# Patient Record
Sex: Female | Born: 1960 | Race: White | Hispanic: No | Marital: Married | Smoking: Never smoker
Health system: Southern US, Community
[De-identification: ages and names within clinical notes are randomized; demographics above are authoritative.]

## PROBLEM LIST (undated history)

## (undated) DIAGNOSIS — C50511 Malignant neoplasm of lower-outer quadrant of right female breast: Secondary | ICD-10-CM

## (undated) DIAGNOSIS — Z9221 Personal history of antineoplastic chemotherapy: Secondary | ICD-10-CM

## (undated) DIAGNOSIS — G43909 Migraine, unspecified, not intractable, without status migrainosus: Secondary | ICD-10-CM

## (undated) DIAGNOSIS — E860 Dehydration: Secondary | ICD-10-CM

## (undated) DIAGNOSIS — R55 Syncope and collapse: Secondary | ICD-10-CM

## (undated) DIAGNOSIS — Z923 Personal history of irradiation: Secondary | ICD-10-CM

## (undated) HISTORY — DX: Malignant neoplasm of lower-outer quadrant of right female breast: C50.511

## (undated) HISTORY — DX: Migraine, unspecified, not intractable, without status migrainosus: G43.909

## (undated) HISTORY — DX: Dehydration: E86.0

## (undated) HISTORY — DX: Syncope and collapse: R55

---

## 2006-04-30 ENCOUNTER — Ambulatory Visit: Payer: Self-pay | Admitting: Unknown Physician Specialty

## 2007-05-11 ENCOUNTER — Ambulatory Visit: Payer: Self-pay | Admitting: Unknown Physician Specialty

## 2008-05-16 ENCOUNTER — Ambulatory Visit: Payer: Self-pay | Admitting: Unknown Physician Specialty

## 2009-05-18 ENCOUNTER — Ambulatory Visit: Payer: Self-pay | Admitting: Unknown Physician Specialty

## 2010-06-07 ENCOUNTER — Ambulatory Visit: Payer: Self-pay | Admitting: Unknown Physician Specialty

## 2011-07-28 ENCOUNTER — Ambulatory Visit: Payer: Self-pay | Admitting: Unknown Physician Specialty

## 2011-12-02 HISTORY — PX: COLONOSCOPY: SHX174

## 2012-01-08 ENCOUNTER — Ambulatory Visit: Payer: Self-pay | Admitting: Unknown Physician Specialty

## 2012-07-29 ENCOUNTER — Ambulatory Visit: Payer: Self-pay | Admitting: Family Medicine

## 2013-08-02 ENCOUNTER — Ambulatory Visit: Payer: Self-pay | Admitting: Family Medicine

## 2014-08-03 ENCOUNTER — Ambulatory Visit: Payer: Self-pay | Admitting: Family Medicine

## 2014-12-01 HISTORY — PX: BREAST LUMPECTOMY: SHX2

## 2015-01-30 DIAGNOSIS — C50919 Malignant neoplasm of unspecified site of unspecified female breast: Secondary | ICD-10-CM | POA: Insufficient documentation

## 2015-02-07 ENCOUNTER — Ambulatory Visit: Payer: Self-pay | Admitting: Nurse Practitioner

## 2015-02-09 ENCOUNTER — Ambulatory Visit: Payer: Self-pay | Admitting: Nurse Practitioner

## 2015-02-09 DIAGNOSIS — C50511 Malignant neoplasm of lower-outer quadrant of right female breast: Secondary | ICD-10-CM

## 2015-02-09 HISTORY — DX: Malignant neoplasm of lower-outer quadrant of right female breast: C50.511

## 2015-02-14 ENCOUNTER — Other Ambulatory Visit: Payer: BLUE CROSS/BLUE SHIELD

## 2015-02-14 ENCOUNTER — Ambulatory Visit (INDEPENDENT_AMBULATORY_CARE_PROVIDER_SITE_OTHER): Payer: BLUE CROSS/BLUE SHIELD | Admitting: General Surgery

## 2015-02-14 ENCOUNTER — Encounter: Payer: Self-pay | Admitting: General Surgery

## 2015-02-14 VITALS — BP 130/76 | HR 76 | Resp 14 | Ht 64.0 in | Wt 152.0 lb

## 2015-02-14 DIAGNOSIS — C50911 Malignant neoplasm of unspecified site of right female breast: Secondary | ICD-10-CM

## 2015-02-14 DIAGNOSIS — N63 Unspecified lump in breast: Secondary | ICD-10-CM

## 2015-02-14 DIAGNOSIS — N6310 Unspecified lump in the right breast, unspecified quadrant: Secondary | ICD-10-CM

## 2015-02-14 DIAGNOSIS — C50919 Malignant neoplasm of unspecified site of unspecified female breast: Secondary | ICD-10-CM

## 2015-02-14 NOTE — Progress Notes (Signed)
Patient ID: Wanda Lopez, female   DOB: 08/18/1961, 54 y.o.   MRN: [id number]  Chief Complaint  Patient presents with  . Other    breast cancer    HPI Wanda Lopez is a 54 y.o. female who presents for a breast evaluation. The most recent mammogram was done on 02-07-15.     She had a breast biopsy done on 02-09-15. Patient performs intermittent breast self breast checks, but does obtain regular mammogram.  She states she noticed the lump in the right breast about 3 weeks ago, near the end of February. Examination at that time showed a new mass in the breast. Denies any family history of breast cancer. She is here today with her husband as well as her sister, Rosine Door.  HPI  No past medical history on file.  Past Surgical History  Procedure Laterality Date  . Colonoscopy  2013    Dr Vira Agar  . Breast biopsy Right 02-09-15    Family History  Problem Relation Age of Onset  . Hypertension Mother   . Hypertension Father   . Diabetes Father   . Heart failure Mother     Social History History  Substance Use Topics  . Smoking status: Never Smoker   . Smokeless tobacco: Never Used  . Alcohol Use: No    No Known Allergies  Current Outpatient Prescriptions  Medication Sig Dispense Refill  . acetaminophen (TYLENOL) 325 MG tablet Take 650 mg by mouth every 6 (six) hours as needed.    . norethindrone-ethinyl estradiol (MICROGESTIN,JUNEL,LOESTRIN) 1-20 MG-MCG tablet Take 1 tablet by mouth daily.     No current facility-administered medications for this visit.    Review of Systems Review of Systems  Constitutional: Negative.   Respiratory: Negative.   Cardiovascular: Negative.     Blood pressure 130/76, pulse 76, resp. rate 14, height 5' 4"  (1.626 m), weight 152 lb (68.947 kg), last menstrual period 01/31/2015.  Physical Exam Physical Exam  Constitutional: She is oriented to person, place, and time. She appears well-developed and well-nourished.  Eyes: Conjunctivae  are normal. No scleral icterus.  Neck: Neck supple.  Cardiovascular: Normal rate, regular rhythm and normal heart sounds.   Pulmonary/Chest: Effort normal and breath sounds normal. Right breast exhibits no inverted nipple, no nipple discharge, no skin change and no tenderness. Left breast exhibits no inverted nipple, no mass, no nipple discharge, no skin change and no tenderness. Breasts are asymmetrical (right breast 1/2 cups size bigger than left).    Abdominal: Soft. Bowel sounds are normal. There is no tenderness.  Lymphadenopathy:    She has no cervical adenopathy.    She has no axillary adenopathy.  Neurological: She is alert and oriented to person, place, and time.  Skin: Skin is warm and dry.    Data Reviewed Screening mammograms dated 08/15/2014 completed [facility] were negative.  Right breast mammogram dated 02/07/2015 showed a 1.4 cm lobular mass need o'clock position of the right breast. Ultrasound showed a 1.0 x 1.3 x 2.0 cm mass.  No axillary adenopathy was noted.  Pathology dated 02/09/2015 showed invasive mammary carcinoma no specific type. Maximum dimension 0.6 cm. ER negative, PR negative, HER-2/neu not amplified.    Ultrasound examination of the right breast was completed to determine possibility for breast conservation and axillary assessment. Examination of the right breast mass at the 9:00 position showed a 1.38 x 1.43 x 1.98 cm hypoechoic, irregular mass consistent with a breast cancer. Exhilarated exam showed a 0.3 x 0.47  x 0.47 cm normal-appearing lymph node.  Assessment    Stage I carcinoma the right breast.    Plan    Telephone conversation with medical oncology: She is not a candidate for neoadjuvant chemotherapy.  We spent the better part of an hour reviewing options for management. She had been told by the etiologies that she would have a lumpectomy.   Options for management of stage I breast cancer were reviewed. Pros and cons of mastectomy versus  partial mastectomy/lumpectomy with radiation therapy were reviewed. The indication for likely adjuvant chemotherapy considering the triple negative status was discussed. This unfortunately became the focal point of the discussion as the patient was brought to tears with the idea that she might lose her hair. She expressed that she had been told that she would simply have a lumpectomy and move on.  We cleared up the misconceptions regarding management of early breast cancer, we were able to get back on track with review of options for management. Options for plastic surgery consultation were discussed. Potential second opinion or meeting with medical oncology/radiation oncology were reviewed.  The patient was provided with written information regarding options for management. She was encouraged to call should her discussions with family generate any questions (approximately 12 questions were answered that had been recorded by the sister prior to the visit).  She will have a CA 27-29 and CEA completed today.  We'll plan for phone follow-up by March 21 at the latest and proceed from that point.      PCP:  Derl Barrow NP  Robert Bellow 02/14/2015, 7:47 PM

## 2015-02-15 ENCOUNTER — Telehealth: Payer: Self-pay | Admitting: General Surgery

## 2015-02-15 LAB — CANCER ANTIGEN 27.29: CA 27.29: 22.6 U/mL (ref 0.0–38.6)

## 2015-02-15 LAB — CEA: CEA: 1.3 ng/mL (ref 0.0–4.7)

## 2015-02-15 NOTE — Telephone Encounter (Signed)
The patient was notified that her CEA and CA 7 27-29 were both normal.  We reviewed options for management including mastectomy versus lumpectomy with postoperative radiation therapy. Sentinel node biopsy was again reviewed.  The big problem is really the likelihood that chemotherapy is going to be recommended. We reviewed the fact that her receptor status is negative, and these tumors tend to be a little more fast-growing and fortunately tend to be a little more responsive to chemotherapy.  She is thinking strongly about breast conservation, and may be making her decision between now and Monday the 21st. We may be able to get her on the schedule for Wednesday the 23rd. She'll call the office with her final decision, any additional questions or if she decides to proceed with second opinion.

## 2015-02-16 ENCOUNTER — Telehealth: Payer: Self-pay

## 2015-02-16 ENCOUNTER — Other Ambulatory Visit: Payer: Self-pay | Admitting: General Surgery

## 2015-02-16 DIAGNOSIS — C50911 Malignant neoplasm of unspecified site of right female breast: Secondary | ICD-10-CM

## 2015-02-16 NOTE — Telephone Encounter (Signed)
Patient called to go over surgery instructions and get dates and times. Patient is scheduled for surgery at [facility] on 02/21/15. She will pre admit by phone on 02/20/15. She will arrive at [facility] on 02/21/15 at 11:15 am. Surgery instructions reviewed with the patient. Patient is aware of dates, time, and instructions.

## 2015-02-19 ENCOUNTER — Encounter: Payer: Self-pay | Admitting: General Surgery

## 2015-02-20 ENCOUNTER — Ambulatory Visit: Payer: Self-pay | Admitting: Anesthesiology

## 2015-02-20 ENCOUNTER — Encounter: Payer: Self-pay | Admitting: General Surgery

## 2015-02-21 ENCOUNTER — Ambulatory Visit: Payer: Self-pay | Admitting: General Surgery

## 2015-02-21 ENCOUNTER — Encounter: Payer: Self-pay | Admitting: General Surgery

## 2015-02-21 DIAGNOSIS — C50511 Malignant neoplasm of lower-outer quadrant of right female breast: Secondary | ICD-10-CM | POA: Diagnosis not present

## 2015-02-21 HISTORY — PX: BREAST EXCISIONAL BIOPSY: SUR124

## 2015-02-22 ENCOUNTER — Encounter: Payer: Self-pay | Admitting: General Surgery

## 2015-02-23 ENCOUNTER — Telehealth: Payer: Self-pay | Admitting: General Surgery

## 2015-02-23 NOTE — Telephone Encounter (Signed)
Patient notified margins clear and single SLN negative for metastatic disease. Reports minimal pain. Unwrapping ace tomorrow, follow up in office in three days.

## 2015-02-26 ENCOUNTER — Other Ambulatory Visit: Payer: BLUE CROSS/BLUE SHIELD

## 2015-02-26 ENCOUNTER — Encounter: Payer: Self-pay | Admitting: General Surgery

## 2015-02-26 ENCOUNTER — Ambulatory Visit (INDEPENDENT_AMBULATORY_CARE_PROVIDER_SITE_OTHER): Payer: BLUE CROSS/BLUE SHIELD | Admitting: General Surgery

## 2015-02-26 VITALS — BP 122/74 | HR 74 | Resp 12 | Ht 64.0 in | Wt 151.0 lb

## 2015-02-26 DIAGNOSIS — C50911 Malignant neoplasm of unspecified site of right female breast: Secondary | ICD-10-CM

## 2015-02-26 NOTE — Telephone Encounter (Signed)
Patient has been scheduled for an appointment with Dr. Grayland Ormond at the [facility] for 02-27-15 at 10 am (arrive 9:45 am). She is aware of date, time, and instructions.   This patient will follow up in the office this afternoon with Dr. Bary Castilla as scheduled.

## 2015-02-26 NOTE — Patient Instructions (Signed)
Patient to return in ine week.

## 2015-02-26 NOTE — Progress Notes (Addendum)
Patient ID: Wanda Lopez, female   DOB: 14-Jun-1961, 54 y.o.   MRN: [id number]  Chief Complaint  Patient presents with  . Routine Post Op    right breast and sentinel node biopsy    HPI Wanda Lopez is a 54 y.o. female  Here for a post op right breast biopsy and sentinel node biopsy done on 02/21/15. Patient states she is doing well.  HPI  Past Medical History  Diagnosis Date  . 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 Wide excision, sentinel node biopsy.    Past Surgical History  Procedure Laterality Date  . Colonoscopy  2013    Dr Vira Agar  . Breast biopsy Right 02-09-15    wide excision/sentinel node biopsy.    Family History  Problem Relation Age of Onset  . Hypertension Mother   . Hypertension Father   . Diabetes Father   . Heart failure Mother     Social History History  Substance Use Topics  . Smoking status: Never Smoker   . Smokeless tobacco: Never Used  . Alcohol Use: No    No Known Allergies  Current Outpatient Prescriptions  Medication Sig Dispense Refill  . acetaminophen (TYLENOL) 325 MG tablet Take 650 mg by mouth every 6 (six) hours as needed.    . norethindrone-ethinyl estradiol (MICROGESTIN,JUNEL,LOESTRIN) 1-20 MG-MCG tablet Take 1 tablet by mouth daily.     No current facility-administered medications for this visit.    Review of Systems Review of Systems  Constitutional: Negative.   Respiratory: Negative.   Cardiovascular: Negative.     Blood pressure 122/74, pulse 74, resp. rate 12, height 5' 4"  (1.626 m), weight 151 lb (68.493 kg), last menstrual period 01/31/2015.  Physical Exam Physical Exam  Constitutional: She is oriented to person, place, and time. She appears well-developed and well-nourished.  Eyes: Conjunctivae are normal. No scleral icterus.  Neck: Neck supple.  Cardiovascular: Normal rate, regular rhythm and normal heart sounds.    Pulmonary/Chest: Effort normal and breath sounds normal.  Right breast incision looks clean and healing well.   Lymphadenopathy:    She has no cervical adenopathy.  Neurological: She is alert and oriented to person, place, and time.  Skin: Skin is warm and dry.    Data Reviewed Pathology showed a 1.7 cm medullary carcinoma. These tumors tend to have a more favorable course in spite of there pathologic appearance. Up to 10% of patients with medullary carcinoma can have BRCA mutation, and the possibility/recommendation for testing was reviewed with the patient and her sister today. As she is scheduled to meet with medical oncology tomorrow, we'll defer testing until their evaluation is complete.  Ultrasound examination of the wide excision site shows a small seroma measuring 0.6 x 4 cm along the incision. There is a deep cavity post mastoplasty measuring 1.86 x 2.4 cm. This is located 1.66 cm below the skin. She is a potential candidate for partial breast radiation.  Assessment    Medullary carcinoma the right breast.     Plan    Indications for medical oncology as well as radiation oncology input was reviewed. The possibility of partial breast radiation was briefly touched upon, but this will be determined by Dr. Baruch Gouty.  Arrangements have been made for the patient to meet with Dr. Grayland Ormond and Dr. Baruch Gouty tomorrow.  Patient to return in one week.     PCP:  Theda Sers  02/26/2015, 9:02 PM

## 2015-02-27 ENCOUNTER — Ambulatory Visit
Admit: 2015-02-27 | Disposition: A | Discharge status: Home / Self Care | Payer: Self-pay | Attending: Oncology | Admitting: Oncology

## 2015-02-28 ENCOUNTER — Telehealth: Payer: Self-pay | Admitting: *Deleted

## 2015-02-28 MED ORDER — CEFADROXIL 500 MG PO CAPS: 500.0000 mg | ORAL_CAPSULE | Freq: Two times a day (BID) | ORAL | Status: DC

## 2015-02-28 NOTE — Telephone Encounter (Signed)
Mammosite schedule reviewed with the patient Placement  03-14-15 1:00       at [facility] Scan 03-16-15 Treat 4-18 to [place] will be calling her for more details Aware of ATB and directions reviewed. Aware no showers and to wear her bra while mammosite in place. Pt agrees.

## 2015-03-01 ENCOUNTER — Encounter: Payer: Self-pay | Admitting: General Surgery

## 2015-03-01 ENCOUNTER — Telehealth: Payer: Self-pay | Admitting: *Deleted

## 2015-03-01 NOTE — Telephone Encounter (Signed)
Pt wants to know if she feels like it, can she go back to work on Monday the 4th?

## 2015-03-02 ENCOUNTER — Telehealth: Payer: Self-pay | Admitting: *Deleted

## 2015-03-02 ENCOUNTER — Ambulatory Visit
Admit: 2015-03-02 | Disposition: A | Discharge status: Home / Self Care | Payer: Self-pay | Attending: Oncology | Admitting: Oncology

## 2015-03-02 NOTE — Telephone Encounter (Signed)
Pt is aware that she can go back to work on Monday April 4th if she is comfortable

## 2015-03-02 NOTE — Telephone Encounter (Signed)
OK to return to work when comfortable.

## 2015-03-06 ENCOUNTER — Encounter: Payer: Self-pay | Admitting: General Surgery

## 2015-03-06 ENCOUNTER — Ambulatory Visit (INDEPENDENT_AMBULATORY_CARE_PROVIDER_SITE_OTHER): Payer: BLUE CROSS/BLUE SHIELD | Admitting: General Surgery

## 2015-03-06 VITALS — BP 128/70 | HR 74 | Resp 12 | Ht 64.0 in | Wt 150.0 lb

## 2015-03-06 DIAGNOSIS — C50911 Malignant neoplasm of unspecified site of right female breast: Secondary | ICD-10-CM

## 2015-03-06 NOTE — Progress Notes (Signed)
Patient ID: Wanda Lopez, female   DOB: Jun 01, 1961, 54 y.o.   MRN: [id number]  Chief Complaint  Patient presents with  . Follow-up    Breast cancer    HPI Wanda Lopez is a 54 y.o. female here today for a evaluation of a port placement scheduled for 03/29/15 and breast cancer follow up. Mammoite is scheduled 02/11/15.  HPI  Past Medical History  Diagnosis Date  . 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 Wide excision, sentinel node biopsy.    Past Surgical History  Procedure Laterality Date  . Colonoscopy  2013    Dr Vira Agar  . Breast biopsy Right 02-09-15    wide excision/sentinel node biopsy.    Family History  Problem Relation Age of Onset  . Hypertension Mother   . Hypertension Father   . Diabetes Father   . Heart failure Mother     Social History History  Substance Use Topics  . Smoking status: Never Smoker   . Smokeless tobacco: Never Used  . Alcohol Use: No    No Known Allergies  Current Outpatient Prescriptions  Medication Sig Dispense Refill  . acetaminophen (TYLENOL) 325 MG tablet Take 650 mg by mouth every 6 (six) hours as needed.    . cefadroxil (DURICEF) 500 MG capsule Take 1 capsule (500 mg total) by mouth 2 (two) times daily. Start one hour before office procedure on 03-14-15 20 capsule 0  . norethindrone-ethinyl estradiol (MICROGESTIN,JUNEL,LOESTRIN) 1-20 MG-MCG tablet Take 1 tablet by mouth daily.     No current facility-administered medications for this visit.    Review of Systems Review of Systems  Constitutional: Negative.   Respiratory: Negative.   Cardiovascular: Negative.     Blood pressure 128/70, pulse 74, resp. rate 12, height 5\' 4"  (1.626 m), weight 150 lb (68.04 kg), last menstrual period 01/31/2015.  Physical Exam Physical Exam  Constitutional: She is oriented to person, place, and time. She appears well-developed and well-nourished.  Eyes:  Conjunctivae are normal. No scleral icterus.  Neck: Neck supple.  Cardiovascular: Normal rate, regular rhythm and normal heart sounds.   Pulmonary/Chest: Effort normal and breath sounds normal. Right breast exhibits no inverted nipple, no mass, no nipple discharge, no skin change and no tenderness.  Right breast incision looks clean and healing well.   Lymphadenopathy:    She has no cervical adenopathy.  Neurological: She is alert and oriented to person, place, and time.  Skin: Skin is warm and dry.    Data Reviewed Medical oncology notes.  Assessment    Doing well status post wide excision and sentinel node biopsy. Candidate for partial breast radiation.    Plan    The MammoSite treatment plan was reviewed. The possibility that anatomic spacing may preclude safe radiation necessitating whole breast treatment post chemotherapy..  Mammoite is scheduled 02/11/15 and  port placement scheduled for 03/29/15.      PCP:  Theda Sers 03/07/2015, 5:41 PM

## 2015-03-07 ENCOUNTER — Encounter: Payer: Self-pay | Admitting: General Surgery

## 2015-03-07 DIAGNOSIS — Z171 Estrogen receptor negative status [ER-]: Secondary | ICD-10-CM

## 2015-03-07 DIAGNOSIS — C50511 Malignant neoplasm of lower-outer quadrant of right female breast: Secondary | ICD-10-CM | POA: Insufficient documentation

## 2015-03-14 ENCOUNTER — Encounter: Payer: Self-pay | Admitting: General Surgery

## 2015-03-14 ENCOUNTER — Ambulatory Visit (INDEPENDENT_AMBULATORY_CARE_PROVIDER_SITE_OTHER): Payer: BLUE CROSS/BLUE SHIELD | Admitting: General Surgery

## 2015-03-14 VITALS — BP 130/70 | HR 74 | Resp 12 | Ht 64.0 in | Wt 151.0 lb

## 2015-03-14 DIAGNOSIS — C50911 Malignant neoplasm of unspecified site of right female breast: Secondary | ICD-10-CM | POA: Diagnosis not present

## 2015-03-14 NOTE — Progress Notes (Addendum)
Patient ID: Wanda Lopez, female   DOB: 05-May-1961, 54 y.o.   MRN: [id number]  Chief Complaint  Patient presents with  . Procedure    right mammosite    HPI Wanda Lopez is a 54 y.o. female.  Here today for planned right breast mammosite. Options for management of postoperative radiation therapy had been reviewed with the patient by Noreene Filbert, M.D. We reviewed the plans for placement of a cavity evaluation device followed by treatment balloon if appropriate spacing was evident. HPI  Past Medical History  Diagnosis Date  . 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 Wide excision, sentinel node biopsy.    Past Surgical History  Procedure Laterality Date  . Colonoscopy  2013    Dr Vira Agar  . Breast biopsy Right 02-09-15    wide excision/sentinel node biopsy.    Family History  Problem Relation Age of Onset  . Hypertension Mother   . Hypertension Father   . Diabetes Father   . Heart failure Mother     Social History History  Substance Use Topics  . Smoking status: Never Smoker   . Smokeless tobacco: Never Used  . Alcohol Use: No    No Known Allergies  Current Outpatient Prescriptions  Medication Sig Dispense Refill  . acetaminophen (TYLENOL) 325 MG tablet Take 650 mg by mouth every 6 (six) hours as needed.    . cefadroxil (DURICEF) 500 MG capsule Take 1 capsule (500 mg total) by mouth 2 (two) times daily. Start one hour before office procedure on 03-14-15 20 capsule 0  . norethindrone-ethinyl estradiol (MICROGESTIN,JUNEL,LOESTRIN) 1-20 MG-MCG tablet Take 1 tablet by mouth daily.     No current facility-administered medications for this visit.    Review of Systems Review of Systems  Constitutional: Negative.   Respiratory: Negative.   Cardiovascular: Negative.     Blood pressure 130/70, pulse 74, resp. rate 12, height 5\' 4"  (1.626 m), weight 151 lb (68.493 kg), last menstrual period  01/31/2015.  Physical Exam Physical Exam  Pulmonary/Chest:       Assessment    T1c, triple negative cancer of the right breast. Candidate for partial breast irradiation.    Plan    The area was prepped with alcohol and a total of 10 mL of 0.5% Xylocaine with 0.25% Marcaine with 1-200,000 of epinephrine was utilized well tolerated. ChloraPrep was applied to the skin. An 8 mm trocar was inserted into the deeper seroma cavity during which time the more superficial seroma cavity was also drained. The cavity evaluation device was placed and inflated with 40 mL of saline. Skin spacing was 1.18 cm. The treatment balloon was then placed and inflated to 40 mL with a minimum distance to the skin of 1.12 cm. The balloon was found to be severe: On ultrasound exam. Bacitracin ointment was applied to the exit site followed by dry gauze dressing. The patient was instructed on post placement wound care. (No showers, continue antibiotics).  The procedure was well tolerated.  She will have a pretreatment balloon location confirmation by CT on Friday, April 15 at the radiation center.  She is scheduled for PowerPort placement on 03/29/2015 for adjuvant chemotherapy.  The patient's MUGA scan dated 03/06/2015 showed a normal left ventricular ejection fraction of 67%.     PCP:  Theda Sers 03/17/2015, 10:43 AM

## 2015-03-14 NOTE — Patient Instructions (Signed)
Patient care kit given to patient.  Instructed no showers, sponge bath while mammosite in place, take antibiotic. Follow up with Cancer Center as arranged. Discussed wearing your bra for support at all times. 

## 2015-03-17 ENCOUNTER — Other Ambulatory Visit: Payer: Self-pay | Admitting: General Surgery

## 2015-03-17 DIAGNOSIS — C50911 Malignant neoplasm of unspecified site of right female breast: Secondary | ICD-10-CM

## 2015-03-19 ENCOUNTER — Other Ambulatory Visit: Payer: Self-pay | Admitting: General Surgery

## 2015-03-19 DIAGNOSIS — C50911 Malignant neoplasm of unspecified site of right female breast: Secondary | ICD-10-CM

## 2015-03-23 ENCOUNTER — Other Ambulatory Visit: Payer: Self-pay | Admitting: General Surgery

## 2015-03-23 ENCOUNTER — Other Ambulatory Visit: Payer: BLUE CROSS/BLUE SHIELD

## 2015-03-23 DIAGNOSIS — C50911 Malignant neoplasm of unspecified site of right female breast: Secondary | ICD-10-CM

## 2015-03-26 ENCOUNTER — Encounter: Payer: Self-pay | Admitting: General Surgery

## 2015-03-26 ENCOUNTER — Ambulatory Visit (INDEPENDENT_AMBULATORY_CARE_PROVIDER_SITE_OTHER): Payer: BLUE CROSS/BLUE SHIELD | Admitting: General Surgery

## 2015-03-26 ENCOUNTER — Other Ambulatory Visit: Payer: Self-pay | Admitting: Oncology

## 2015-03-26 VITALS — BP 120/74 | HR 76 | Resp 12 | Ht 64.0 in | Wt 152.0 lb

## 2015-03-26 DIAGNOSIS — C50911 Malignant neoplasm of unspecified site of right female breast: Secondary | ICD-10-CM

## 2015-03-26 LAB — SURGICAL PATHOLOGY

## 2015-03-26 NOTE — Patient Instructions (Addendum)
Patient is scheduled for a port placement on 03/29/15.  Patient to return in six weeks.

## 2015-03-26 NOTE — Progress Notes (Signed)
Patient ID: Wanda Lopez, female   DOB: 01-04-1961, 54 y.o.   MRN: [id number]  Chief Complaint  Patient presents with  . Other    right mammosite    HPI Wanda Lopez is a 54 y.o. female here today for her 2 week follow up right mammosite . Mammosite was removed on 03/23/15.  HPI  Past Medical History  Diagnosis Date  . 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 Wide excision, sentinel node biopsy.    Past Surgical History  Procedure Laterality Date  . Colonoscopy  2013    Dr Vira Agar  . Breast biopsy Right 02-09-15    wide excision/sentinel node biopsy.    Family History  Problem Relation Age of Onset  . Hypertension Mother   . Hypertension Father   . Diabetes Father   . Heart failure Mother     Social History History  Substance Use Topics  . Smoking status: Never Smoker   . Smokeless tobacco: Never Used  . Alcohol Use: No    No Known Allergies  Current Outpatient Prescriptions  Medication Sig Dispense Refill  . acetaminophen (TYLENOL) 325 MG tablet Take 650 mg by mouth every 6 (six) hours as needed.    . cefadroxil (DURICEF) 500 MG capsule Take 1 capsule (500 mg total) by mouth 2 (two) times daily. Start one hour before office procedure on 03-14-15 20 capsule 0   No current facility-administered medications for this visit.    Review of Systems Review of Systems  Constitutional: Negative.   Respiratory: Negative.   Cardiovascular: Negative.     Blood pressure 120/74, pulse 76, resp. rate 12, height 5\' 4"  (1.626 m), weight 152 lb (68.947 kg).  Physical Exam Physical Exam  Constitutional: She appears well-developed and well-nourished.  Pulmonary/Chest:    Right breast incision is clean and healing well.   Lymphadenopathy:    She has no cervical adenopathy.  Skin: Skin is warm and dry.    Data Reviewed Post MammoSite placement CT imaging studies previously reviewed at the  cancer center.  Assessment    Doing well status post accelerated partial breast radiation.    Plan    Patient is scheduled for a port placement on 03/29/15.     This patient will return to the office in 6 weeks.   PCP: Theda Sers 03/26/2015, 9:39 PM

## 2015-03-27 LAB — CREATININE, SERUM: Creatine, Serum: 0.61

## 2015-03-29 ENCOUNTER — Ambulatory Visit
Admit: 2015-03-29 | Disposition: A | Discharge status: Home / Self Care | Payer: Self-pay | Attending: General Surgery | Admitting: General Surgery

## 2015-03-29 DIAGNOSIS — C50911 Malignant neoplasm of unspecified site of right female breast: Secondary | ICD-10-CM | POA: Diagnosis not present

## 2015-03-29 HISTORY — PX: PORTACATH PLACEMENT: SHX2246

## 2015-03-30 ENCOUNTER — Encounter: Payer: Self-pay | Admitting: General Surgery

## 2015-04-01 NOTE — Op Note (Signed)
PATIENT NAME:  Wanda Lopez, Wanda Lopez MR#:  [id number] DATE OF BIRTH:  [birth year]  DATE OF PROCEDURE:  03/29/2015  PREOPERATIVE DIAGNOSIS: Breast cancer, need for central venous access.   POSTOPERATIVE DIAGNOSIS: Breast cancer, need for central venous access.   OPERATIVE PROCEDURE: Left subclavian PowerPort placement with ultrasound and fluoroscopic guidance.   SURGEON: Robert Bellow, MD   ANESTHESIA: Attended local, 10 mL of 1% plain Xylocaine.   ESTIMATED BLOOD LOSS: Minimal.   CLINICAL NOTE: This 54 year old woman is a candidate for adjuvant chemotherapy, status post treatment of breast cancer. Central venous access was requested by the treating oncologist.   OPERATIVE NOTE: With the patient comfortably supine on the operating room table and having previously received Kefzol intravenously, the area was prepped with ChloraPrep and draped. The left subclavian vein was found to be patent on ultrasound, and it was cannulated under ultrasound guidance after local anesthesia was infiltrated. The guidewire and dilator were passed, followed by the catheter. This was positioned at the junction of the right atrium and SVC. It was tunneled to a pocket on the left anterior chest. The port was anchored to the deep tissue with 3-0 Prolene sutures. The wound was closed in layers with 3-0 Vicryl to the adipose tissue and a running 4-0 Vicryl subcuticular suture for the skin. The catheter easily irrigated and aspirated in the supine position. Benzoin and Steri-Strips, followed by Telfa and Tegaderm dressings were applied.   An erect chest x-ray in the recovery room showed the catheter tip as described above and no evidence of pneumothorax.     ____________________________ Robert Bellow, MD [email or web address] D: 03/29/2015 11:57:18 ET T: 03/29/2015 17:34:11 ET JOB#: [id number]  cc: Robert Bellow, MD, <Dictator> Kathlene November. Grayland Ormond, MD Kaeleen Odom Amedeo Kinsman MD ELECTRONICALLY SIGNED 03/29/2015 21:59

## 2015-04-01 NOTE — Consult Note (Signed)
Reason for Visit: This 54 year old Female patient presents to the clinic for initial evaluation of  breast cancer .   Referred by Dr. Bary Castilla.  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  Pathology Report pathology report reviewed   Imaging Report mammograms reviewed   Referral Report clinical notes reviewed   Planned Treatment Regimen adjuvant breast radiation plus minus systemic chemotherapy   HPI   patient is a 54 year old female who presents with a self discovered massof the right breastprompting mammogram mammogram and ultrasound confirmation and 8 ultrasound-guided biopsy which was positive for medullary type invasive mammary carcinoma. She was seen by surgery and underwent wide local excision for a 1.7 cm invasive mammary carcinoma medullary type. Margins were clear but close at 0.2 cm. One sentinel lymph node was performed showing no evidence of metastatic disease. Again tumor was ER PR negative HER-2/neu negative. Tumors overall grade 3 no lymphovascular invasion was seen. She is seen today for consultation with radiation oncology and medical oncology. She is doing well and is without complaints.  Past Hx:    Migraines:    Breast Cancer:    Colonoscopy:    Right Breast Biopsy:   Past, Family and Social History:  Past Medical History positive   Neurological/Psychiatric migraine   Family History positive   Family History Comments family history of hypertension, adult onset diabetes and CHF.   Social History noncontributory   Additional Past Medical and Surgical History accompanied by friend today.   Allergies:   No Known Allergies:   Home Meds:  Home Medications: Medication Instructions Status  Tylenol 325 mg oral tablet 2 tab(s) orally every 4 hours, As Needed Active  ethinyl estradiol-norethindrone 20  mcg-1 mg oral tablet 1 tab(s) orally once a day Active  Norco 325 mg-5 mg oral tablet 1-2 tab(s) orally every 4 hours, As Needed - for Pain Active   Review of Systems:  General negative   Performance Status (ECOG) 0   Skin negative   Breast see HPI   Ophthalmologic negative   ENMT negative   Respiratory and Thorax negative   Cardiovascular negative   Gastrointestinal negative   Genitourinary negative   Musculoskeletal negative   Neurological negative   Psychiatric negative   Hematology/Lymphatics negative   Endocrine negative   Allergic/Immunologic negative   Review of Systems   denies any weight loss, fatigue, weakness, fever, chills or night sweats. Patient denies any loss of vision, blurred vision. Patient denies any ringing  of the ears or hearing loss. No irregular heartbeat. Patient denies heart murmur or history of fainting. Patient denies any chest pain or pain radiating to her upper extremities. Patient denies any shortness of breath, difficulty breathing at night, cough or hemoptysis. Patient denies any swelling in the lower legs. Patient denies any nausea vomiting, vomiting of blood, or coffee ground material in the vomitus. Patient denies any stomach pain. Patient states has had normal bowel movements no significant constipation or diarrhea. Patient denies any dysuria, hematuria or significant nocturia. Patient denies any problems walking, swelling in the joints or loss of balance. Patient denies any skin changes, loss of hair or loss of weight. Patient denies any excessive worrying or anxiety or significant depression. Patient denies any problems with insomnia. Patient denies excessive thirst, polyuria, polydipsia. Patient denies any swollen glands, patient denies easy bruising or easy bleeding. Patient denies  any recent infections, allergies or URI. Patient "s visual fields have not changed significantly in recent time.   Nursing Notes:  Nursing Vital Signs and  Chemo Nursing Nursing Notes: *CC Vital Signs Flowsheet:   29-Mar-16 09:36  Temp Temperature 97.2  SBP SBP 118  DBP DBP 84  Current Weight (kg) (kg) 68.6   Physical Exam:  General/Skin/HEENT:  General normal   Skin normal   Eyes normal   ENMT normal   Head and Neck normal   Additional PE well-developed female in NAD. Lungs are clear to A&P cardiac examination shows regular rate and rhythm. She status post wide local excision of the right breast incision healed well. No dominant mass or nodularity is noted in either breast in 2 positions examined. Surgery has been recent although incision is well-healed with Steri-Strips present. No axillary or supraclavicular adenopathy is identified. Abdomen is benign.   Breasts/Resp/CV/GI/GU:  Respiratory and Thorax normal   Cardiovascular normal   Gastrointestinal normal   Genitourinary normal   MS/Neuro/Psych/Lymph:  Musculoskeletal normal   Neurological normal   Lymphatics normal   Relevent Results:   Relevant Scans and Labs mammograms have been reviewed I have requested her most recent mammogram and ultrasound for my review although these were both presented at our breast cancer conference   Assessment and Plan: Impression:   stage IA invasive mammary carcinoma medullary type triple negative in 54 year old female status post wide local excision and sentinel node biopsy. Plan:   patient is a candidate for accelerated partial breast irradiation. I have recommended adjuvant radiation therapy have described in detail both whole breast radiation as well as accelerated partial breast radiation. She is seeing medical oncology today and will have discussion about risks and benefits of systemic chemotherapy. Should she decide on accelerated partial breast radiation will go ahead and arrange for MammoSite catheter placement followed up by 3400 cGy over 10 fractions at 340 cGy twice a day. Risks and benefits of treatment including skin  reaction, fatigue, permanent thickness of the lumpectomy site all were discussed in detail with the patient. Patient's case was discussed at our breast cancer conference and adjuvant treatment options were discussed. She will see medical oncology today for discussion on pros and cons of chemotherapy. Should she proceed with MammoSite catheter placement will coordinate with surgeon's office simulation and treatment schedule.  I would like to take this opportunity for allowing me to participate in the care of your patient..  Electronic Signatures: Armstead Peaks (MD)  (Signed 29-Mar-16 10:14)  Authored: HPI, Diagnosis, Past Hx, PFSH, Allergies, Home Meds, ROS, Nursing Notes, Physical Exam, Relevent Results, Encounter Assessment and Plan   Last Updated: 29-Mar-16 10:14 by Armstead Peaks (MD)

## 2015-04-01 NOTE — Op Note (Signed)
PATIENT NAME:  Wanda Lopez, Wanda Lopez MR#:  [id number] DATE OF BIRTH:  15-Aug-1961  DATE OF PROCEDURE:  02/21/2015  PREOPERATIVE DIAGNOSIS:  Right breast cancer, lower inner quadrant.   POSTOPERATIVE DIAGNOSIS:  Right breast cancer, lower inner quadrant.   OPERATIVE PROCEDURE:  Wide local excision, mastoplasty, sentinel node biopsy.   SURGEON:  Robert Bellow, MD  ANESTHESIA:  General by LMA under Dr. Andree Elk.   Marcaine 0.5% with 1:200,000 units of epinephrine, 30 mL local infiltration.   ESTIMATED BLOOD LOSS:  Minimal.   CLINICAL NOTE:  This 54 year old woman was recently identified with a new right lower outer quadrant breast mass. Core biopsy showed evidence of triple negative cancer. She was felt to be a candidate for breast conservation.   OPERATIVE NOTE:  The patient was injected with technetium sulfur colloid prior to the procedure. After induction of general anesthesia, a total of 4 mL of normal saline/methylene blue diluted 2:1 was injected in the subareolar plexus. The breast, chest, and anterior axilla were prepped with ChloraPrep and draped. The gamma finder was used to identify the area of increased uptake in the lower aspect of the axillary envelope. Marcaine was infiltrated for postoperative analgesia. A small transverse incision was made and carried down through the skin and subcutaneous tissue with hemostasis achieved by electrocautery. A single hot blue lymph node with multiple channels leading into it was identified. This was sent for pathologic review and frozen section report with no evidence of macrometastatic disease. Careful examination of the remaining axilla through the wound showed no other areas of discrete uptake and no palpable adenopathy.   While the lymph node was being processed, attention was turned to the breast lesion. Ultrasound was used to confirm the visible boundaries. It was elected to make use of a radial incision. Marcaine was infiltrated again for  postoperative analgesia. The skin was incised sharply and the remaining dissection completed with electrocautery. The area was excised and orientated. Specimen radiograph confirmed the previously placed clip was in the center of the specimen. Pathology reported the closest margin was superficial at 2 mm, all of the margins widely negative. The breast was elevated off the underlying pectoralis muscle and serratus muscle with cautery. This was then approximated along the radial axis with interrupted 2-0 Vicryl figure-of-eight sutures. The skin flaps were elevated circumferentially and the deeper breast parenchyma approximated in a similar fashion. The skin was then closed with a running 3-0 Vicryl subcuticular suture. The axillary wound was closed with 2-0 Vicryl figure-of-eight suture followed by running 4-0 Vicryl subcuticular suture for the skin. Benzoin and Steri-Strips were applied to both wounds. Fluff gauze, Kerlix, and an Ace wrap were then applied.   The patient tolerated the procedure well and was taken to the recovery room in stable condition.   ____________________________ Robert Bellow, MD jwb:nb D: 02/21/2015 20:18:49 ET T: 02/22/2015 07:35:27 ET JOB#: [id number]  cc: Robert Bellow, MD, <Dictator> Vevelyn Francois, NP Adryel Wortmann Amedeo Kinsman MD ELECTRONICALLY SIGNED 02/23/2015 11:02

## 2015-04-04 ENCOUNTER — Encounter: Payer: Self-pay | Admitting: Oncology

## 2015-04-04 ENCOUNTER — Inpatient Hospital Stay: Payer: BLUE CROSS/BLUE SHIELD | Attending: Oncology

## 2015-04-04 ENCOUNTER — Inpatient Hospital Stay: Payer: BLUE CROSS/BLUE SHIELD

## 2015-04-04 ENCOUNTER — Inpatient Hospital Stay (HOSPITAL_BASED_OUTPATIENT_CLINIC_OR_DEPARTMENT_OTHER): Payer: BLUE CROSS/BLUE SHIELD | Admitting: Oncology

## 2015-04-04 VITALS — BP 111/69 | HR 84 | Temp 97.5°F | Resp 16 | Wt 151.7 lb

## 2015-04-04 DIAGNOSIS — C50911 Malignant neoplasm of unspecified site of right female breast: Secondary | ICD-10-CM

## 2015-04-04 DIAGNOSIS — Z171 Estrogen receptor negative status [ER-]: Secondary | ICD-10-CM | POA: Diagnosis not present

## 2015-04-04 DIAGNOSIS — Z5111 Encounter for antineoplastic chemotherapy: Secondary | ICD-10-CM | POA: Insufficient documentation

## 2015-04-04 DIAGNOSIS — Z79899 Other long term (current) drug therapy: Secondary | ICD-10-CM

## 2015-04-04 DIAGNOSIS — Z418 Encounter for other procedures for purposes other than remedying health state: Secondary | ICD-10-CM | POA: Diagnosis not present

## 2015-04-04 DIAGNOSIS — C50511 Malignant neoplasm of lower-outer quadrant of right female breast: Secondary | ICD-10-CM

## 2015-04-04 DIAGNOSIS — Z923 Personal history of irradiation: Secondary | ICD-10-CM

## 2015-04-04 DIAGNOSIS — Z5189 Encounter for other specified aftercare: Secondary | ICD-10-CM

## 2015-04-04 LAB — COMPREHENSIVE METABOLIC PANEL
ALBUMIN: 4 g/dL (ref 3.5–5.0)
ALT: 16 U/L (ref 14–54)
ANION GAP: 10 (ref 5–15)
AST: 29 U/L (ref 15–41)
Alkaline Phosphatase: 61 U/L (ref 38–126)
BILIRUBIN TOTAL: 0.6 mg/dL (ref 0.3–1.2)
BUN: 15 mg/dL (ref 6–20)
CALCIUM: 9.1 mg/dL (ref 8.9–10.3)
CHLORIDE: 100 mmol/L — AB (ref 101–111)
CO2: 28 mmol/L (ref 22–32)
CREATININE: 0.58 mg/dL (ref 0.44–1.00)
GFR calc Af Amer: >60 mL/min (ref >60)
Glucose, Bld: 98 mg/dL (ref 65–99)
Potassium: 3.6 mmol/L (ref 3.5–5.1)
SODIUM: 138 mmol/L (ref 135–145)
Total Protein: 7.6 g/dL (ref 6.5–8.1)

## 2015-04-04 LAB — CBC WITH DIFFERENTIAL/PLATELET
Basophils Absolute: 0.1 10*3/uL (ref 0–0.1)
Basophils Relative: 1 %
Eosinophils Absolute: 0.5 10*3/uL (ref 0–0.7)
Eosinophils Relative: 9 %
HCT: 42.7 % (ref 35.0–47.0)
Hemoglobin: 14.6 g/dL (ref 12.0–16.0)
Lymphocytes Relative: 21 %
Lymphs Abs: 1.1 10*3/uL (ref 1.0–3.6)
MCH: 33.2 pg (ref 26.0–34.0)
MCHC: 34.1 g/dL (ref 32.0–36.0)
MCV: 97.6 fL (ref 80.0–100.0)
Monocytes Absolute: 0.5 10*3/uL (ref 0.2–0.9)
Monocytes Relative: 9 %
NEUTROS ABS: 3.1 10*3/uL (ref 1.4–6.5)
Platelets: 159 10*3/uL (ref 150–440)
RBC: 4.38 MIL/uL (ref 3.80–5.20)
RDW: 13.1 % (ref 11.5–14.5)
WBC: 5.3 10*3/uL (ref 3.6–11.0)

## 2015-04-04 MED ORDER — SODIUM CHLORIDE 0.9 % IJ SOLN
10.0000 mL | Freq: Once | INTRAMUSCULAR | Status: AC
  Administered 2015-04-04: 10 mL via INTRAVENOUS
  Filled 2015-04-04: qty 10

## 2015-04-04 MED ORDER — SODIUM CHLORIDE 0.9 % IV SOLN
Freq: Once | INTRAVENOUS | Status: AC
  Administered 2015-04-04: 16:00:00 via INTRAVENOUS
  Filled 2015-04-04: qty 250

## 2015-04-04 MED ORDER — FOSAPREPITANT DIMEGLUMINE INJECTION 150 MG
Freq: Once | INTRAVENOUS | Status: AC
  Administered 2015-04-04: 16:00:00 via INTRAVENOUS
  Filled 2015-04-04: qty 5

## 2015-04-04 MED ORDER — HEPARIN SOD (PORK) LOCK FLUSH 100 UNIT/ML IV SOLN
500.0000 [IU] | Freq: Once | INTRAVENOUS | Status: AC
  Administered 2015-04-04: 500 [IU] via INTRAVENOUS

## 2015-04-04 MED ORDER — SODIUM CHLORIDE 0.9 % IV SOLN
1000.0000 mg | Freq: Once | INTRAVENOUS | Status: AC
  Administered 2015-04-04: 1000 mg via INTRAVENOUS
  Filled 2015-04-04: qty 50

## 2015-04-04 MED ORDER — PALONOSETRON HCL INJECTION 0.25 MG/5ML
0.2500 mg | Freq: Once | INTRAVENOUS | Status: AC
  Administered 2015-04-04: 0.25 mg via INTRAVENOUS
  Filled 2015-04-04: qty 5

## 2015-04-04 MED ORDER — DOXORUBICIN HCL CHEMO IV INJECTION 2 MG/ML
60.0000 mg/m2 | Freq: Once | INTRAVENOUS | Status: AC
  Administered 2015-04-04: 106 mg via INTRAVENOUS
  Filled 2015-04-04: qty 53

## 2015-04-04 MED ORDER — SODIUM CHLORIDE 0.9 % IJ SOLN
10.0000 mL | Freq: Once | INTRAMUSCULAR | Status: AC
Start: 2015-04-04 — End: 2015-04-04
  Administered 2015-04-04: 10 mL via INTRAVENOUS
  Filled 2015-04-04: qty 10

## 2015-04-05 ENCOUNTER — Telehealth: Payer: Self-pay

## 2015-04-05 NOTE — Telephone Encounter (Signed)
Patient states she is feeling "a little tired,but abale to work today".  Verbalizes understanding of rationale for Neulasta injection that she is coming for tomorrow afternoon.

## 2015-04-06 ENCOUNTER — Inpatient Hospital Stay: Payer: BLUE CROSS/BLUE SHIELD

## 2015-04-06 VITALS — BP 103/66 | HR 83 | Temp 97.3°F | Resp 18

## 2015-04-06 DIAGNOSIS — C50911 Malignant neoplasm of unspecified site of right female breast: Secondary | ICD-10-CM

## 2015-04-06 DIAGNOSIS — C50511 Malignant neoplasm of lower-outer quadrant of right female breast: Secondary | ICD-10-CM | POA: Diagnosis not present

## 2015-04-06 MED ORDER — PEGFILGRASTIM INJECTION 6 MG/0.6ML ~~LOC~~
6.0000 mg | PREFILLED_SYRINGE | Freq: Once | SUBCUTANEOUS | Status: AC
  Administered 2015-04-06: 6 mg via SUBCUTANEOUS
  Filled 2015-04-06: qty 0.6

## 2015-04-06 NOTE — Progress Notes (Signed)
[place]  Telephone:(336) [phone] Fax:(336) [phone]  ID: Wanda Lopez OB: Aug 15, 1961  MR#: [id number]  OZD#:[id number]  Patient Care Team: Maceo Pro, MD as PCP - General (Obstetrics and Gynecology) Gae Dry, MD as Referring Physician (Obstetrics and Gynecology) Robert Bellow, MD (General Surgery)  CHIEF COMPLAINT:  Chief Complaint  Patient presents with  . Follow-up    breast cancer  . Chemotherapy    initiation    INTERVAL HISTORY: Patient returns to clinic today for further evaluation and consideration of cycle 1 of 4 of Adriamycin and Cytoxan. She completed her MammoSite therapy without significant side effects. She continues to be highly anxious, but otherwise feels well. She has no neurologic complaints. She denies any recent fevers. She denies any chest pain or shortness of breath. She denies any nausea, vomiting, constipation, or diarrhea. She has no urinary complaints. Patient otherwise feels well and offers no further specific complaints.  REVIEW OF SYSTEMS:   Review of Systems  Constitutional: Negative.   Respiratory: Negative.   Cardiovascular: Negative.     As per HPI. Otherwise, a complete review of systems is negatve.  PAST MEDICAL HISTORY: Past Medical History  Diagnosis Date  . 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 Wide excision, sentinel node biopsy.  . Breast cancer     PAST SURGICAL HISTORY: Past Surgical History  Procedure Laterality Date  . Colonoscopy  2013    Dr Vira Agar  . Breast biopsy Right 02-09-15    wide excision/sentinel node biopsy.    FAMILY HISTORY Family History  Problem Relation Age of Onset  . Hypertension Mother   . Hypertension Father   . Diabetes Father   . Heart failure Mother        ADVANCED DIRECTIVES:    HEALTH MAINTENANCE: History  Substance Use Topics  . Smoking status: Never Smoker   .  Smokeless tobacco: Never Used  . Alcohol Use: No     Colonoscopy:  PAP:  Bone density:  Lipid panel:  No Known Allergies  Current Outpatient Prescriptions  Medication Sig Dispense Refill  . acetaminophen (TYLENOL) 325 MG tablet Take 650 mg by mouth every 6 (six) hours as needed.    . lidocaine-prilocaine (EMLA) cream Apply to port and cover with saran wrap 1-2 hours before chemotherapy  0  . prochlorperazine (COMPAZINE) 10 MG tablet Take 1 tablet by mouth 3 (three) times daily as needed.  0  . cefadroxil (DURICEF) 500 MG capsule Take 1 capsule (500 mg total) by mouth 2 (two) times daily. Start one hour before office procedure on 03-14-15 (Patient not taking: Reported on 04/04/2015) 20 capsule 0   No current facility-administered medications for this visit.    OBJECTIVE: Filed Vitals:   04/04/15 1449  BP: 111/69  Pulse: 84  Temp: 97.5 F (36.4 C)  Resp: 16     Body mass index is 26.05 kg/(m^2).    ECOG FS:0 - Asymptomatic  General: Well-developed, well-nourished, no acute distress. Eyes: Pink conjunctiva, anicteric sclera. Breasts: Exam deferred today. Lungs: Clear to auscultation bilaterally. Heart: Regular rate and rhythm. No rubs, murmurs, or gallops. Abdomen: Soft, nontender, nondistended. No organomegaly noted, normoactive bowel sounds. Musculoskeletal: No edema, cyanosis, or clubbing. Neuro: Alert, answering all questions appropriately. Cranial nerves grossly intact. Skin: No rashes or petechiae noted. Psych: Normal affect.   LAB RESULTS:  Lab Results  Component Value Date   NA 138 04/04/2015  K 3.6 04/04/2015   CL 100* 04/04/2015   CO2 28 04/04/2015   GLUCOSE 98 04/04/2015   BUN 15 04/04/2015   CREATININE 0.58 04/04/2015   CALCIUM 9.1 04/04/2015   PROT 7.6 04/04/2015   ALBUMIN 4.0 04/04/2015   AST 29 04/04/2015   ALT 16 04/04/2015   ALKPHOS 61 04/04/2015   BILITOT 0.6 04/04/2015   GFRNONAA >60 04/04/2015   GFRAA >60 04/04/2015    Lab Results    Component Value Date   WBC 5.3 04/04/2015   NEUTROABS 3.1 04/04/2015   HGB 14.6 04/04/2015   HCT 42.7 04/04/2015   MCV 97.6 04/04/2015   PLT 159 04/04/2015     STUDIES: Dg Chest Port 1 View  03/29/2015   CLINICAL DATA:  Port-A-Cath placement  EXAM: PORTABLE CHEST - 1 VIEW  COMPARISON:  None.  FINDINGS: Port-A-Cath tip is in the right atrium slightly beyond the cavoatrial junction. No pneumothorax. Lungs are clear. Heart size and pulmonary vascularity are normal. No adenopathy. No bone lesions.  IMPRESSION: Port-A-Cath tip is in the right atrium without pneumothorax. No edema or consolidation.   Electronically Signed   By: Lowella Grip III M.D.   On: 03/29/2015 08:43    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  PLAN:     1. Breast cancer: Given a triple negative status of her disease patient will definitely benefit from adjuvant chemotherapy. Patient has completed her MammoSite therapy and has tolerated it well. Pretreatment MUGA revealed an EF of 67%. Patient has had a port placed as well. Proceed with cycle 1 of 4 of dose dense Adriamycin and Cytoxan. Return to clinic on Friday for Neulasta, in 1 week for laboratory work and further evaluation, and then in 2 weeks for consideration of cycle 2. This will be followed by weekly Taxol 12.  Patient expressed understanding and was in agreement with this plan. She also understands that She can call clinic at any time with any questions, concerns, or complaints.   Breast cancer   Staging form: Breast, AJCC 7th Edition     Clinical stage from 03/26/2015: Stage IA (T1c, N0, M0) - Signed by Lloyd Huger, MD on 03/26/2015   Lloyd Huger, MD   04/06/2015 3:50 PM

## 2015-04-09 ENCOUNTER — Other Ambulatory Visit: Payer: Self-pay | Admitting: Oncology

## 2015-04-11 ENCOUNTER — Inpatient Hospital Stay (HOSPITAL_BASED_OUTPATIENT_CLINIC_OR_DEPARTMENT_OTHER): Payer: BLUE CROSS/BLUE SHIELD | Admitting: Oncology

## 2015-04-11 ENCOUNTER — Inpatient Hospital Stay: Payer: BLUE CROSS/BLUE SHIELD

## 2015-04-11 VITALS — BP 96/66 | HR 75 | Temp 96.6°F | Resp 20 | Wt 147.5 lb

## 2015-04-11 DIAGNOSIS — Z171 Estrogen receptor negative status [ER-]: Secondary | ICD-10-CM

## 2015-04-11 DIAGNOSIS — F419 Anxiety disorder, unspecified: Secondary | ICD-10-CM

## 2015-04-11 DIAGNOSIS — R110 Nausea: Secondary | ICD-10-CM

## 2015-04-11 DIAGNOSIS — Z79899 Other long term (current) drug therapy: Secondary | ICD-10-CM

## 2015-04-11 DIAGNOSIS — C50511 Malignant neoplasm of lower-outer quadrant of right female breast: Secondary | ICD-10-CM

## 2015-04-11 DIAGNOSIS — C50911 Malignant neoplasm of unspecified site of right female breast: Secondary | ICD-10-CM

## 2015-04-11 LAB — CBC WITH DIFFERENTIAL/PLATELET
BASOS ABS: 0 10*3/uL (ref 0–0.1)
Basophils Relative: 3 %
Eosinophils Absolute: 0.2 10*3/uL (ref 0–0.7)
Eosinophils Relative: 19 %
HEMATOCRIT: 42.5 % (ref 35.0–47.0)
Hemoglobin: 14.2 g/dL (ref 12.0–16.0)
Lymphocytes Relative: 42 %
Lymphs Abs: 0.4 10*3/uL — ABNORMAL LOW (ref 1.0–3.6)
MCH: 32.9 pg (ref 26.0–34.0)
MCHC: 33.5 g/dL (ref 32.0–36.0)
MCV: 98.3 fL (ref 80.0–100.0)
MONO ABS: 0 10*3/uL — AB (ref 0.2–0.9)
Monocytes Relative: 3 %
NEUTROS ABS: 0.3 10*3/uL — AB (ref 1.4–6.5)
Neutrophils Relative %: 33 %
PLATELETS: 59 10*3/uL — AB (ref 150–440)
RBC: 4.32 MIL/uL (ref 3.80–5.20)
RDW: 12.9 % (ref 11.5–14.5)
WBC: 0.9 10*3/uL — AB (ref 3.6–11.0)

## 2015-04-11 LAB — COMPREHENSIVE METABOLIC PANEL
ALK PHOS: 79 U/L (ref 38–126)
ALT: 13 U/L — AB (ref 14–54)
AST: 22 U/L (ref 15–41)
Albumin: 4.2 g/dL (ref 3.5–5.0)
Anion gap: 5 (ref 5–15)
BUN: 16 mg/dL (ref 6–20)
CALCIUM: 9 mg/dL (ref 8.9–10.3)
CHLORIDE: 98 mmol/L — AB (ref 101–111)
CO2: 30 mmol/L (ref 22–32)
CREATININE: 0.47 mg/dL (ref 0.44–1.00)
GFR calc non Af Amer: >60 mL/min (ref >60)
GLUCOSE: 90 mg/dL (ref 65–99)
Potassium: 4 mmol/L (ref 3.5–5.1)
Sodium: 133 mmol/L — ABNORMAL LOW (ref 135–145)
Total Bilirubin: 1.2 mg/dL (ref 0.3–1.2)
Total Protein: 7.9 g/dL (ref 6.5–8.1)

## 2015-04-11 NOTE — Progress Notes (Signed)
Patient here today for 1 week follow up visit after completing first chemotherapy treatment with AC. Patient denies any concerns today, reports treatment went well, reports body aches with Neulasta injection.

## 2015-04-18 ENCOUNTER — Inpatient Hospital Stay (HOSPITAL_BASED_OUTPATIENT_CLINIC_OR_DEPARTMENT_OTHER): Payer: BLUE CROSS/BLUE SHIELD | Admitting: Oncology

## 2015-04-18 ENCOUNTER — Inpatient Hospital Stay: Payer: BLUE CROSS/BLUE SHIELD

## 2015-04-18 VITALS — BP 108/73 | HR 80 | Temp 96.7°F | Resp 20 | Wt 148.8 lb

## 2015-04-18 VITALS — BP 107/72 | HR 80 | Resp 18

## 2015-04-18 DIAGNOSIS — R110 Nausea: Secondary | ICD-10-CM | POA: Diagnosis not present

## 2015-04-18 DIAGNOSIS — F419 Anxiety disorder, unspecified: Secondary | ICD-10-CM

## 2015-04-18 DIAGNOSIS — C50511 Malignant neoplasm of lower-outer quadrant of right female breast: Secondary | ICD-10-CM

## 2015-04-18 DIAGNOSIS — Z79899 Other long term (current) drug therapy: Secondary | ICD-10-CM

## 2015-04-18 DIAGNOSIS — Z171 Estrogen receptor negative status [ER-]: Secondary | ICD-10-CM | POA: Diagnosis not present

## 2015-04-18 DIAGNOSIS — C50911 Malignant neoplasm of unspecified site of right female breast: Secondary | ICD-10-CM

## 2015-04-18 LAB — COMPREHENSIVE METABOLIC PANEL
ALT: 19 U/L (ref 14–54)
ANION GAP: 7 (ref 5–15)
AST: 28 U/L (ref 15–41)
Albumin: 4 g/dL (ref 3.5–5.0)
Alkaline Phosphatase: 82 U/L (ref 38–126)
BUN: 12 mg/dL (ref 6–20)
CALCIUM: 9 mg/dL (ref 8.9–10.3)
CO2: 27 mmol/L (ref 22–32)
Chloride: 101 mmol/L (ref 101–111)
Creatinine, Ser: 0.49 mg/dL (ref 0.44–1.00)
GFR calc Af Amer: >60 mL/min (ref >60)
GFR calc non Af Amer: >60 mL/min (ref >60)
GLUCOSE: 103 mg/dL — AB (ref 65–99)
Potassium: 3.9 mmol/L (ref 3.5–5.1)
SODIUM: 135 mmol/L (ref 135–145)
TOTAL PROTEIN: 7.4 g/dL (ref 6.5–8.1)
Total Bilirubin: 0.4 mg/dL (ref 0.3–1.2)

## 2015-04-18 LAB — CBC WITH DIFFERENTIAL/PLATELET
BASOS ABS: 0 10*3/uL (ref 0–0.1)
BASOS PCT: 1 %
EOS ABS: 0 10*3/uL (ref 0–0.7)
Eosinophils Relative: 1 %
HEMATOCRIT: 37.7 % (ref 35.0–47.0)
Hemoglobin: 12.9 g/dL (ref 12.0–16.0)
Lymphocytes Relative: 12 %
Lymphs Abs: 0.7 10*3/uL — ABNORMAL LOW (ref 1.0–3.6)
MCH: 32.9 pg (ref 26.0–34.0)
MCHC: 34.1 g/dL (ref 32.0–36.0)
MCV: 96.7 fL (ref 80.0–100.0)
Monocytes Absolute: 0.8 10*3/uL (ref 0.2–0.9)
Monocytes Relative: 14 %
NEUTROS ABS: 4.2 10*3/uL (ref 1.4–6.5)
Neutrophils Relative %: 72 %
PLATELETS: 138 10*3/uL — AB (ref 150–440)
RBC: 3.9 MIL/uL (ref 3.80–5.20)
RDW: 12.6 % (ref 11.5–14.5)
WBC: 5.8 10*3/uL (ref 3.6–11.0)

## 2015-04-18 MED ORDER — SODIUM CHLORIDE 0.9 % IV SOLN
1000.0000 mg | Freq: Once | INTRAVENOUS | Status: AC
  Administered 2015-04-18: 1000 mg via INTRAVENOUS
  Filled 2015-04-18: qty 50

## 2015-04-18 MED ORDER — PALONOSETRON HCL INJECTION 0.25 MG/5ML
0.2500 mg | Freq: Once | INTRAVENOUS | Status: AC
  Administered 2015-04-18: 0.25 mg via INTRAVENOUS
  Filled 2015-04-18: qty 5

## 2015-04-18 MED ORDER — HEPARIN SOD (PORK) LOCK FLUSH 100 UNIT/ML IV SOLN
500.0000 [IU] | Freq: Once | INTRAVENOUS | Status: AC | PRN
  Administered 2015-04-18: 500 [IU]
  Filled 2015-04-18: qty 5

## 2015-04-18 MED ORDER — SODIUM CHLORIDE 0.9 % IV SOLN
Freq: Once | INTRAVENOUS | Status: AC
  Administered 2015-04-18: 12:00:00 via INTRAVENOUS
  Filled 2015-04-18: qty 1000

## 2015-04-18 MED ORDER — DOXORUBICIN HCL CHEMO IV INJECTION 2 MG/ML
60.0000 mg/m2 | Freq: Once | INTRAVENOUS | Status: AC
  Administered 2015-04-18: 106 mg via INTRAVENOUS
  Filled 2015-04-18: qty 53

## 2015-04-18 MED ORDER — SODIUM CHLORIDE 0.9 % IV SOLN
Freq: Once | INTRAVENOUS | Status: AC
  Administered 2015-04-18: 13:00:00 via INTRAVENOUS
  Filled 2015-04-18: qty 5

## 2015-04-20 ENCOUNTER — Inpatient Hospital Stay: Payer: BLUE CROSS/BLUE SHIELD

## 2015-04-20 VITALS — BP 95/61 | HR 72 | Temp 96.0°F | Resp 18

## 2015-04-20 DIAGNOSIS — C50911 Malignant neoplasm of unspecified site of right female breast: Secondary | ICD-10-CM

## 2015-04-20 DIAGNOSIS — C50511 Malignant neoplasm of lower-outer quadrant of right female breast: Secondary | ICD-10-CM | POA: Diagnosis not present

## 2015-04-20 MED ORDER — PEGFILGRASTIM INJECTION 6 MG/0.6ML ~~LOC~~
6.0000 mg | PREFILLED_SYRINGE | Freq: Once | SUBCUTANEOUS | Status: AC
Start: 2015-04-20 — End: 2015-04-20
  Administered 2015-04-20: 6 mg via SUBCUTANEOUS
  Filled 2015-04-20: qty 0.6

## 2015-04-23 ENCOUNTER — Telehealth: Payer: Self-pay | Admitting: *Deleted

## 2015-04-23 MED ORDER — OMEPRAZOLE 20 MG PO CPDR: 20.0000 mg | DELAYED_RELEASE_CAPSULE | Freq: Every day | ORAL | Status: DC

## 2015-04-23 NOTE — Telephone Encounter (Signed)
Since recent tx, she has feeling that food gets stuck or is coming partially back up and has to drink to get it down, burning some too. She is using TUMS without relief. Also states she is not sleeping well and has hot flashes at night

## 2015-04-23 NOTE — Telephone Encounter (Signed)
Spoke with pt regarding trying something otc for sleep per Dr Grayland Ormond and that rx sent in for GERD and may increase to bid if needed after 3 days

## 2015-05-01 ENCOUNTER — Encounter: Payer: Self-pay | Admitting: General Surgery

## 2015-05-01 NOTE — Progress Notes (Signed)
[place]  Telephone:(336) [phone] Fax:(336) [phone]  ID: Wanda Lopez OB: [birth year]  MR#: [id number]  PYP#:[id number]  Patient Care Team: Maceo Pro, MD as PCP - General (Obstetrics and Gynecology) Gae Dry, MD as Referring Physician (Obstetrics and Gynecology) Robert Bellow, MD (General Surgery)  CHIEF COMPLAINT:  Chief Complaint  Patient presents with  . Follow-up    breast cancer  . Chemotherapy    INTERVAL HISTORY: Patient returns to clinic today for further evaluation and consideration of cycle 2 of 4 of Adriamycin and Cytoxan. She continues to be highly anxious, but otherwise feels well. She has no neurologic complaints. She denies any recent fevers. She denies any chest pain or shortness of breath. She denies any vomiting, constipation, or diarrhea. She has no urinary complaints. Patient otherwise feels well and offers no further specific complaints.  REVIEW OF SYSTEMS:   Review of Systems  Constitutional: Negative.   Psychiatric/Behavioral: The patient is nervous/anxious.     As per HPI. Otherwise, a complete review of systems is negatve.  PAST MEDICAL HISTORY: Past Medical History  Diagnosis Date  . 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 Wide excision, sentinel node biopsy.  . Breast cancer     PAST SURGICAL HISTORY: Past Surgical History  Procedure Laterality Date  . Colonoscopy  2013    Dr Vira Agar  . Breast biopsy Right 02-09-15    wide excision/sentinel node biopsy.    FAMILY HISTORY Family History  Problem Relation Age of Onset  . Hypertension Mother   . Hypertension Father   . Diabetes Father   . Heart failure Mother        ADVANCED DIRECTIVES:    HEALTH MAINTENANCE: History  Substance Use Topics  . Smoking status: Never Smoker   . Smokeless tobacco: Never Used  . Alcohol Use: No     Colonoscopy:  PAP:  Bone  density:  Lipid panel:  No Known Allergies  Current Outpatient Prescriptions  Medication Sig Dispense Refill  . acetaminophen (TYLENOL) 325 MG tablet Take 650 mg by mouth every 6 (six) hours as needed.    . lidocaine-prilocaine (EMLA) cream Apply to port and cover with saran wrap 1-2 hours before chemotherapy  0  . prochlorperazine (COMPAZINE) 10 MG tablet Take 1 tablet by mouth 3 (three) times daily as needed.  0  . omeprazole (PRILOSEC) 20 MG capsule Take 1 capsule (20 mg total) by mouth daily. May increase to twice a day if once daily does not help after 3 days 45 capsule 2   No current facility-administered medications for this visit.    OBJECTIVE: Filed Vitals:   04/18/15 1127  BP: 108/73  Pulse: 80  Temp: 96.7 F (35.9 C)  Resp: 20     Body mass index is 25.56 kg/(m^2).    ECOG FS:0 - Asymptomatic  General: Well-developed, well-nourished, no acute distress. Eyes: Anicteric sclera. Breasts: Exam deferred today. Lungs: Clear to auscultation bilaterally. Heart: Regular rate and rhythm. No rubs, murmurs, or gallops. Abdomen: Soft, nontender, nondistended. No organomegaly noted, normoactive bowel sounds. Musculoskeletal: No edema, cyanosis, or clubbing. Neuro: Alert, answering all questions appropriately. Cranial nerves grossly intact. Skin: No rashes or petechiae noted. Psych: Normal affect.   LAB RESULTS:  Lab Results  Component Value Date   NA 135 04/18/2015   K 3.9 04/18/2015   CL 101 04/18/2015   CO2 27 04/18/2015   GLUCOSE 103* 04/18/2015  BUN 12 04/18/2015   CREATININE 0.49 04/18/2015   CALCIUM 9.0 04/18/2015   PROT 7.4 04/18/2015   ALBUMIN 4.0 04/18/2015   AST 28 04/18/2015   ALT 19 04/18/2015   ALKPHOS 82 04/18/2015   BILITOT 0.4 04/18/2015   GFRNONAA >60 04/18/2015   GFRAA >60 04/18/2015    Lab Results  Component Value Date   WBC 5.8 04/18/2015   NEUTROABS 4.2 04/18/2015   HGB 12.9 04/18/2015   HCT 37.7 04/18/2015   MCV 96.7 04/18/2015    PLT 138* 04/18/2015     STUDIES: No results found.  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  PLAN:     1. Breast cancer: Given a triple negative status of her disease patient will definitely benefit from adjuvant chemotherapy. Patient has completed her MammoSite therapy and has tolerated it well. Pretreatment MUGA revealed an EF of 67%. Patient has had a port placed as well. Proceed with cycle 2 of 4 of dose dense Adriamycin and Cytoxan today. Return to clinic in 2 days for Neulasta and then in 2 weeks for consideration of cycle 3. 2. Nausea: Continue Compazine as prescribed. 3. Anxiety: Monitor.  Patient expressed understanding and was in agreement with this plan. She also understands that She can call clinic at any time with any questions, concerns, or complaints.   Breast cancer   Staging form: Breast, AJCC 7th Edition     Clinical stage from 03/26/2015: Stage IA (T1c, N0, M0) - Signed by Lloyd Huger, MD on 03/26/2015   Lloyd Huger, MD   05/01/2015 9:00 AM

## 2015-05-01 NOTE — Progress Notes (Signed)
[place]  Telephone:(336) [phone] Fax:(336) [phone]  ID: Wanda Lopez OB: [birth year]  MR#: [id number]  ERD#:[id number]  Patient Care Team: Maceo Pro, MD as PCP - General (Obstetrics and Gynecology) Gae Dry, MD as Referring Physician (Obstetrics and Gynecology) Robert Bellow, MD (General Surgery)  CHIEF COMPLAINT:  Chief Complaint  Patient presents with  . Follow-up    post first chemotherapy treatment    INTERVAL HISTORY: Patient returns to clinic today for further evaluation and to assess her toleration of cycle 1 of 4 of Adriamycin and Cytoxan. She tolerated her treatment well only with some mild nausea and decreased appetite. She continues to be highly anxious, but otherwise feels well. She has no neurologic complaints. She denies any recent fevers. She denies any chest pain or shortness of breath. She denies any vomiting, constipation, or diarrhea. She has no urinary complaints. Patient otherwise feels well and offers no further specific complaints.  REVIEW OF SYSTEMS:   Review of Systems  Constitutional: Negative.   Gastrointestinal: Negative for nausea.  Psychiatric/Behavioral: The patient is nervous/anxious.     As per HPI. Otherwise, a complete review of systems is negatve.  PAST MEDICAL HISTORY: Past Medical History  Diagnosis Date  . 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 Wide excision, sentinel node biopsy.  . Breast cancer     PAST SURGICAL HISTORY: Past Surgical History  Procedure Laterality Date  . Colonoscopy  2013    Dr Vira Agar  . Breast biopsy Right 02-09-15    wide excision/sentinel node biopsy.    FAMILY HISTORY Family History  Problem Relation Age of Onset  . Hypertension Mother   . Hypertension Father   . Diabetes Father   . Heart failure Mother        ADVANCED DIRECTIVES:    HEALTH MAINTENANCE: History  Substance Use  Topics  . Smoking status: Never Smoker   . Smokeless tobacco: Never Used  . Alcohol Use: No     Colonoscopy:  PAP:  Bone density:  Lipid panel:  No Known Allergies  Current Outpatient Prescriptions  Medication Sig Dispense Refill  . acetaminophen (TYLENOL) 325 MG tablet Take 650 mg by mouth every 6 (six) hours as needed.    . lidocaine-prilocaine (EMLA) cream Apply to port and cover with saran wrap 1-2 hours before chemotherapy  0  . prochlorperazine (COMPAZINE) 10 MG tablet Take 1 tablet by mouth 3 (three) times daily as needed.  0  . omeprazole (PRILOSEC) 20 MG capsule Take 1 capsule (20 mg total) by mouth daily. May increase to twice a day if once daily does not help after 3 days 45 capsule 2   No current facility-administered medications for this visit.    OBJECTIVE: Filed Vitals:   04/11/15 1148  BP: 96/66  Pulse: 75  Temp: 96.6 F (35.9 C)  Resp: 20     Body mass index is 25.33 kg/(m^2).    ECOG FS:0 - Asymptomatic  General: Well-developed, well-nourished, no acute distress. Eyes: Anicteric sclera. Breasts: Exam deferred today. Lungs: Clear to auscultation bilaterally. Heart: Regular rate and rhythm. No rubs, murmurs, or gallops. Abdomen: Soft, nontender, nondistended. No organomegaly noted, normoactive bowel sounds. Musculoskeletal: No edema, cyanosis, or clubbing. Neuro: Alert, answering all questions appropriately. Cranial nerves grossly intact. Skin: No rashes or petechiae noted. Psych: Normal affect.   LAB RESULTS:  Lab Results  Component Value Date   NA 135 04/18/2015  K 3.9 04/18/2015   CL 101 04/18/2015   CO2 27 04/18/2015   GLUCOSE 103* 04/18/2015   BUN 12 04/18/2015   CREATININE 0.49 04/18/2015   CALCIUM 9.0 04/18/2015   PROT 7.4 04/18/2015   ALBUMIN 4.0 04/18/2015   AST 28 04/18/2015   ALT 19 04/18/2015   ALKPHOS 82 04/18/2015   BILITOT 0.4 04/18/2015   GFRNONAA >60 04/18/2015   GFRAA >60 04/18/2015    Lab Results  Component Value  Date   WBC 5.8 04/18/2015   NEUTROABS 4.2 04/18/2015   HGB 12.9 04/18/2015   HCT 37.7 04/18/2015   MCV 96.7 04/18/2015   PLT 138* 04/18/2015     STUDIES: No results found.  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  PLAN:     1. Breast cancer: Given a triple negative status of her disease patient will definitely benefit from adjuvant chemotherapy. Patient has completed her MammoSite therapy and has tolerated it well. Pretreatment MUGA revealed an EF of 67%. Patient has had a port placed as well. She tolerated cycle 1 of 4 of dose dense Adriamycin and Cytoxan without significant side effects. Return to clinic in 1 week for consideration of cycle 2. This will be followed by weekly Taxol 12. 2. Nausea: Continue Compazine as prescribed. 3. Anxiety: Monitor.  Patient expressed understanding and was in agreement with this plan. She also understands that She can call clinic at any time with any questions, concerns, or complaints.   Breast cancer   Staging form: Breast, AJCC 7th Edition     Clinical stage from 03/26/2015: Stage IA (T1c, N0, M0) - Signed by Lloyd Huger, MD on 03/26/2015   Lloyd Huger, MD   05/01/2015 8:57 AM

## 2015-05-02 ENCOUNTER — Ambulatory Visit: Payer: BLUE CROSS/BLUE SHIELD

## 2015-05-02 ENCOUNTER — Other Ambulatory Visit: Payer: BLUE CROSS/BLUE SHIELD

## 2015-05-02 ENCOUNTER — Inpatient Hospital Stay (HOSPITAL_BASED_OUTPATIENT_CLINIC_OR_DEPARTMENT_OTHER): Payer: BLUE CROSS/BLUE SHIELD | Admitting: Oncology

## 2015-05-02 ENCOUNTER — Inpatient Hospital Stay: Payer: BLUE CROSS/BLUE SHIELD | Attending: Oncology

## 2015-05-02 ENCOUNTER — Inpatient Hospital Stay: Payer: BLUE CROSS/BLUE SHIELD

## 2015-05-02 ENCOUNTER — Ambulatory Visit: Payer: BLUE CROSS/BLUE SHIELD | Admitting: Oncology

## 2015-05-02 VITALS — BP 105/72 | HR 85 | Temp 97.2°F | Resp 16 | Wt 151.5 lb

## 2015-05-02 DIAGNOSIS — D6959 Other secondary thrombocytopenia: Secondary | ICD-10-CM | POA: Diagnosis not present

## 2015-05-02 DIAGNOSIS — C50511 Malignant neoplasm of lower-outer quadrant of right female breast: Secondary | ICD-10-CM | POA: Diagnosis present

## 2015-05-02 DIAGNOSIS — Z418 Encounter for other procedures for purposes other than remedying health state: Secondary | ICD-10-CM | POA: Insufficient documentation

## 2015-05-02 DIAGNOSIS — Z79899 Other long term (current) drug therapy: Secondary | ICD-10-CM | POA: Insufficient documentation

## 2015-05-02 DIAGNOSIS — Z171 Estrogen receptor negative status [ER-]: Secondary | ICD-10-CM

## 2015-05-02 DIAGNOSIS — R110 Nausea: Secondary | ICD-10-CM | POA: Insufficient documentation

## 2015-05-02 DIAGNOSIS — Z5111 Encounter for antineoplastic chemotherapy: Secondary | ICD-10-CM | POA: Insufficient documentation

## 2015-05-02 DIAGNOSIS — D72829 Elevated white blood cell count, unspecified: Secondary | ICD-10-CM | POA: Insufficient documentation

## 2015-05-02 DIAGNOSIS — E860 Dehydration: Secondary | ICD-10-CM | POA: Insufficient documentation

## 2015-05-02 DIAGNOSIS — R531 Weakness: Secondary | ICD-10-CM | POA: Insufficient documentation

## 2015-05-02 DIAGNOSIS — C50911 Malignant neoplasm of unspecified site of right female breast: Secondary | ICD-10-CM

## 2015-05-02 DIAGNOSIS — R55 Syncope and collapse: Secondary | ICD-10-CM | POA: Insufficient documentation

## 2015-05-02 LAB — COMPREHENSIVE METABOLIC PANEL
ALT: 21 U/L (ref 14–54)
ANION GAP: 4 — AB (ref 5–15)
AST: 26 U/L (ref 15–41)
Albumin: 3.7 g/dL (ref 3.5–5.0)
Alkaline Phosphatase: 100 U/L (ref 38–126)
BUN: 7 mg/dL (ref 6–20)
CHLORIDE: 101 mmol/L (ref 101–111)
CO2: 29 mmol/L (ref 22–32)
Calcium: 8.5 mg/dL — ABNORMAL LOW (ref 8.9–10.3)
Creatinine, Ser: 0.44 mg/dL (ref 0.44–1.00)
Glucose, Bld: 116 mg/dL — ABNORMAL HIGH (ref 65–99)
Potassium: 3.5 mmol/L (ref 3.5–5.1)
SODIUM: 134 mmol/L — AB (ref 135–145)
Total Bilirubin: 0.3 mg/dL (ref 0.3–1.2)
Total Protein: 6.7 g/dL (ref 6.5–8.1)

## 2015-05-02 LAB — CBC WITH DIFFERENTIAL/PLATELET
Basophils Absolute: 0 10*3/uL (ref 0–0.1)
Basophils Relative: 0 %
EOS ABS: 0 10*3/uL (ref 0–0.7)
Eosinophils Relative: 0 %
HCT: 30.5 % — ABNORMAL LOW (ref 35.0–47.0)
HEMOGLOBIN: 10.4 g/dL — AB (ref 12.0–16.0)
LYMPHS ABS: 0.5 10*3/uL — AB (ref 1.0–3.6)
Lymphocytes Relative: 4 %
MCH: 32.7 pg (ref 26.0–34.0)
MCHC: 34 g/dL (ref 32.0–36.0)
MCV: 96.3 fL (ref 80.0–100.0)
MONOS PCT: 11 %
Monocytes Absolute: 1.4 10*3/uL — ABNORMAL HIGH (ref 0.2–0.9)
Neutro Abs: 10.7 10*3/uL — ABNORMAL HIGH (ref 1.4–6.5)
Neutrophils Relative %: 85 %
Platelets: 118 10*3/uL — ABNORMAL LOW (ref 150–440)
RBC: 3.17 MIL/uL — AB (ref 3.80–5.20)
RDW: 12.6 % (ref 11.5–14.5)
WBC: 12.7 10*3/uL — AB (ref 3.6–11.0)

## 2015-05-02 MED ORDER — PALONOSETRON HCL INJECTION 0.25 MG/5ML
0.2500 mg | Freq: Once | INTRAVENOUS | Status: AC
  Administered 2015-05-02: 0.25 mg via INTRAVENOUS
  Filled 2015-05-02: qty 5

## 2015-05-02 MED ORDER — SODIUM CHLORIDE 0.9 % IV SOLN
Freq: Once | INTRAVENOUS | Status: AC
  Administered 2015-05-02: 15:00:00 via INTRAVENOUS
  Filled 2015-05-02: qty 1000

## 2015-05-02 MED ORDER — SODIUM CHLORIDE 0.9 % IV SOLN
1000.0000 mg | Freq: Once | INTRAVENOUS | Status: AC
  Administered 2015-05-02: 1000 mg via INTRAVENOUS
  Filled 2015-05-02: qty 50

## 2015-05-02 MED ORDER — HEPARIN SOD (PORK) LOCK FLUSH 100 UNIT/ML IV SOLN
500.0000 [IU] | Freq: Once | INTRAVENOUS | Status: AC | PRN
  Administered 2015-05-02: 500 [IU]
  Filled 2015-05-02: qty 5

## 2015-05-02 MED ORDER — SODIUM CHLORIDE 0.9 % IJ SOLN
10.0000 mL | INTRAMUSCULAR | Status: DC | PRN
  Administered 2015-05-02: 10 mL
  Filled 2015-05-02: qty 10

## 2015-05-02 MED ORDER — FOSAPREPITANT DIMEGLUMINE INJECTION 150 MG
Freq: Once | INTRAVENOUS | Status: AC
  Administered 2015-05-02: 15:00:00 via INTRAVENOUS
  Filled 2015-05-02: qty 5

## 2015-05-02 MED ORDER — DOXORUBICIN HCL CHEMO IV INJECTION 2 MG/ML
60.0000 mg/m2 | Freq: Once | INTRAVENOUS | Status: AC
  Administered 2015-05-02: 106 mg via INTRAVENOUS
  Filled 2015-05-02: qty 53

## 2015-05-03 ENCOUNTER — Ambulatory Visit (INDEPENDENT_AMBULATORY_CARE_PROVIDER_SITE_OTHER): Payer: BLUE CROSS/BLUE SHIELD | Admitting: General Surgery

## 2015-05-03 ENCOUNTER — Encounter: Payer: Self-pay | Admitting: General Surgery

## 2015-05-03 VITALS — BP 110/60 | HR 80 | Resp 12 | Ht 64.0 in | Wt 151.0 lb

## 2015-05-03 DIAGNOSIS — C50911 Malignant neoplasm of unspecified site of right female breast: Secondary | ICD-10-CM

## 2015-05-03 NOTE — Progress Notes (Signed)
Patient ID: Wanda Lopez, female   DOB: Mar 07, 1961, 54 y.o.   MRN: [id number]  Chief Complaint  Patient presents with  . Follow-up    breast cancer and port placement    HPI Wanda Lopez is a 54 y.o. female.  Here today for postoperative visit, port placement on 03-29-15.  She is doing well. Chemotherapy was yesterday tolerating it fair with mild shortness of breath. Some heartburn/acid reflex with chemotherapy. Has been prescribed omeprazole but was taking it when necessary.   HPI  Past Medical History  Diagnosis Date  . 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 Wide excision, sentinel node biopsy.  . Breast cancer     Past Surgical History  Procedure Laterality Date  . Colonoscopy  2013    Dr Vira Agar  . Breast biopsy Right 02-09-15    wide excision/sentinel node biopsy.  . Portacath placement  03-29-15    Dr Bary Castilla    Family History  Problem Relation Age of Onset  . Hypertension Mother   . Hypertension Father   . Diabetes Father   . Heart failure Mother     Social History History  Substance Use Topics  . Smoking status: Never Smoker   . Smokeless tobacco: Never Used  . Alcohol Use: No    No Known Allergies  Current Outpatient Prescriptions  Medication Sig Dispense Refill  . acetaminophen (TYLENOL) 325 MG tablet Take 650 mg by mouth every 6 (six) hours as needed.    . DiphenhydrAMINE HCl, Sleep, (UNISOM SLEEPGELS PO) Take by mouth as needed.    . lidocaine-prilocaine (EMLA) cream Apply to port and cover with saran wrap 1-2 hours before chemotherapy  0  . loratadine (CLARITIN) 10 MG tablet Take 10 mg by mouth daily as needed for allergies.    Marland Kitchen omeprazole (PRILOSEC) 20 MG capsule Take 1 capsule (20 mg total) by mouth daily. May increase to twice a day if once daily does not help after 3 days (Patient taking differently: Take 20 mg by mouth as needed. May increase to twice a day if once  daily does not help after 3 days) 45 capsule 2  . prochlorperazine (COMPAZINE) 10 MG tablet Take 1 tablet by mouth 3 (three) times daily as needed.  0   No current facility-administered medications for this visit.    Review of Systems Review of Systems  Constitutional: Negative.   Respiratory: Positive for shortness of breath.   Cardiovascular: Negative.     Blood pressure 110/60, pulse 80, resp. rate 12, height 5\' 4"  (1.626 m), weight 151 lb (68.493 kg).  Physical Exam Physical Exam  Constitutional: She is oriented to person, place, and time. She appears well-developed and well-nourished.  Neck: Neck supple.  Cardiovascular: Normal rate, regular rhythm and normal heart sounds.   Pulmonary/Chest: Effort normal and breath sounds normal.    Right breast incision well healed. Left chest port in place.  Lymphadenopathy:    She has no cervical adenopathy.  Neurological: She is alert and oriented to person, place, and time.  Skin: Skin is warm and dry.       Assessment    Doing well status post breast conservation for right breast malignancy. Adjuvant chemotherapy due to triple negative status.    Plan    The patient was encouraged to either use the omeprazole daily or to substitute Zantac for more immediate relief of her reflux symptoms.    Follow in  3 months. Zantac for immediate relief of reflux.   PCP:  Ward, Cletus Gash 05/03/2015, 9:54 AM

## 2015-05-03 NOTE — Patient Instructions (Signed)
Zantac as needed for immediate relief of reflux Continue self breast exams. Call office for any new breast issues or concerns.

## 2015-05-04 ENCOUNTER — Inpatient Hospital Stay: Payer: BLUE CROSS/BLUE SHIELD

## 2015-05-04 VITALS — BP 102/64 | HR 74 | Temp 98.0°F | Resp 16

## 2015-05-04 DIAGNOSIS — C50511 Malignant neoplasm of lower-outer quadrant of right female breast: Secondary | ICD-10-CM | POA: Diagnosis not present

## 2015-05-04 DIAGNOSIS — C50919 Malignant neoplasm of unspecified site of unspecified female breast: Secondary | ICD-10-CM

## 2015-05-04 MED ORDER — PEGFILGRASTIM INJECTION 6 MG/0.6ML ~~LOC~~
6.0000 mg | PREFILLED_SYRINGE | Freq: Once | SUBCUTANEOUS | Status: AC
  Administered 2015-05-04: 6 mg via SUBCUTANEOUS
  Filled 2015-05-04: qty 0.6

## 2015-05-07 ENCOUNTER — Ambulatory Visit: Payer: BLUE CROSS/BLUE SHIELD | Admitting: General Surgery

## 2015-05-09 ENCOUNTER — Ambulatory Visit
Admission: RE | Admit: 2015-05-09 | Discharge: 2015-05-09 | Disposition: A | Discharge status: Home / Self Care | Payer: BLUE CROSS/BLUE SHIELD | Source: Ambulatory Visit | Attending: Radiation Oncology | Admitting: Radiation Oncology

## 2015-05-09 ENCOUNTER — Encounter: Payer: Self-pay | Admitting: Radiation Oncology

## 2015-05-09 VITALS — BP 103/66 | HR 82 | Temp 97.2°F | Resp 18 | Wt 148.1 lb

## 2015-05-09 DIAGNOSIS — C50911 Malignant neoplasm of unspecified site of right female breast: Secondary | ICD-10-CM

## 2015-05-09 NOTE — Progress Notes (Signed)
Radiation Oncology Follow up Note  Name: Wanda Lopez   Date:   05/09/2015 MRN:  [id number] DOB: 30-Jul-1961    This 54 y.o. female presents to the clinic today for follow-up for breast cancer.  REFERRING PROVIDER: Ward, Honor Loh, MD  HPI: Patient is a 54 year old female now 1 month having completed accelerated partial breast irradiation of the right breast lower outer quadrant triple negative status post wide local excision and sentinel node biopsy. She is currently undergoing systemic chemotherapy consisting of Cytoxan and Adriamycin. This is making her quite nauseous and fatigued. She is really without complaint as far as the breast cancer is concerned specifically denies breast tenderness cough or bone pain..  COMPLICATIONS OF TREATMENT: none  FOLLOW UP COMPLIANCE: keeps appointments   PHYSICAL EXAM:  BP 103/66 mmHg  Pulse 82  Temp(Src) 97.2 F (36.2 C)  Resp 18  Wt 148 lb 2.4 oz (67.2 kg) Lungs are clear to A&P cardiac examination essentially unremarkable with regular rate and rhythm. No dominant mass or nodularity is noted in either breast in 2 positions examined. Incision is well-healed. No axillary or supraclavicular adenopathy is appreciated. Cosmetic result is excellent. Well-developed well-nourished patient in NAD. HEENT reveals PERLA, EOMI, discs not visualized.  Oral cavity is clear. No oral mucosal lesions are identified. Neck is clear without evidence of cervical or supraclavicular adenopathy. Lungs are clear to A&P. Cardiac examination is essentially unremarkable with regular rate and rhythm without murmur rub or thrill. Abdomen is benign with no organomegaly or masses noted. Motor sensory and DTR levels are equal and symmetric in the upper and lower extremities. Cranial nerves II through XII are grossly intact. Proprioception is intact. No peripheral adenopathy or edema is identified. No motor or sensory levels are noted. Crude visual fields are within normal  range.   RADIOLOGY RESULTS: No new radiologic reports to review  PLAN: Present time she continues to do well. She will continue with her systemic chemotherapy under medical oncology's direction. She is triple negative so will not be candidate for aromatase inhibitor therapy. I have asked to see her back in 6 months after she is completed all chemotherapy. Patient is to call sooner with any concerns.  I would like to take this opportunity for allowing me to participate in the care of your patient.Armstead Peaks., MD

## 2015-05-14 NOTE — Progress Notes (Signed)
[place]  Telephone:(336) [phone] Fax:(336) [phone]  ID: Wanda Lopez OB: 1961-11-27  MR#: [id number]  BUL#:[id number]  Patient Care Team: Maceo Pro, MD as PCP - General (Obstetrics and Gynecology) Gae Dry, MD as Referring Physician (Obstetrics and Gynecology) Robert Bellow, MD (General Surgery)  CHIEF COMPLAINT:  Chief Complaint  Patient presents with  . Follow-up    breast cancer    INTERVAL HISTORY: Patient returns to clinic today for further evaluation and consideration of cycle 3 of 4 of Adriamycin and Cytoxan. She continues to be highly anxious, but otherwise feels well. She has no neurologic complaints. She denies any recent fevers. She denies any chest pain or shortness of breath. She denies any vomiting, constipation, or diarrhea. She has no urinary complaints. Patient offers no further specific complaints.  REVIEW OF SYSTEMS:   Review of Systems  Constitutional: Negative.   Psychiatric/Behavioral: The patient is nervous/anxious.     As per HPI. Otherwise, a complete review of systems is negatve.  PAST MEDICAL HISTORY: Past Medical History  Diagnosis Date  . 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 Wide excision, sentinel node biopsy.  . Breast cancer     PAST SURGICAL HISTORY: Past Surgical History  Procedure Laterality Date  . Colonoscopy  2013    Dr Vira Agar  . Breast biopsy Right 02-09-15    wide excision/sentinel node biopsy.  . Portacath placement  03-29-15    Dr Bary Castilla    FAMILY HISTORY Family History  Problem Relation Age of Onset  . Hypertension Mother   . Hypertension Father   . Diabetes Father   . Heart failure Mother        ADVANCED DIRECTIVES:    HEALTH MAINTENANCE: History  Substance Use Topics  . Smoking status: Never Smoker   . Smokeless tobacco: Never Used  . Alcohol Use: No     Colonoscopy:  PAP:  Bone  density:  Lipid panel:  No Known Allergies  Current Outpatient Prescriptions  Medication Sig Dispense Refill  . acetaminophen (TYLENOL) 325 MG tablet Take 650 mg by mouth every 6 (six) hours as needed.    . lidocaine-prilocaine (EMLA) cream Apply to port and cover with saran wrap 1-2 hours before chemotherapy  0  . omeprazole (PRILOSEC) 20 MG capsule Take 1 capsule (20 mg total) by mouth daily. May increase to twice a day if once daily does not help after 3 days (Patient taking differently: Take 20 mg by mouth as needed. May increase to twice a day if once daily does not help after 3 days) 45 capsule 2  . prochlorperazine (COMPAZINE) 10 MG tablet Take 1 tablet by mouth 3 (three) times daily as needed.  0  . DiphenhydrAMINE HCl, Sleep, (UNISOM SLEEPGELS PO) Take by mouth as needed.    . loratadine (CLARITIN) 10 MG tablet Take 10 mg by mouth daily as needed for allergies.     No current facility-administered medications for this visit.    OBJECTIVE: Filed Vitals:   05/02/15 1422  BP: 105/72  Pulse: 85  Temp: 97.2 F (36.2 C)  Resp: 16     Body mass index is 26.02 kg/(m^2).    ECOG FS:0 - Asymptomatic  General: Well-developed, well-nourished, no acute distress. Eyes: Anicteric sclera. Breasts: Exam deferred today. Lungs: Clear to auscultation bilaterally. Heart: Regular rate and rhythm. No rubs, murmurs, or gallops. Abdomen: Soft, nontender, nondistended. No organomegaly noted, normoactive bowel  sounds. Musculoskeletal: No edema, cyanosis, or clubbing. Neuro: Alert, answering all questions appropriately. Cranial nerves grossly intact. Skin: No rashes or petechiae noted. Psych: Normal affect.   LAB RESULTS:  Lab Results  Component Value Date   NA 134* 05/02/2015   K 3.5 05/02/2015   CL 101 05/02/2015   CO2 29 05/02/2015   GLUCOSE 116* 05/02/2015   BUN 7 05/02/2015   CREATININE 0.44 05/02/2015   CALCIUM 8.5* 05/02/2015   PROT 6.7 05/02/2015   ALBUMIN 3.7 05/02/2015    AST 26 05/02/2015   ALT 21 05/02/2015   ALKPHOS 100 05/02/2015   BILITOT 0.3 05/02/2015   GFRNONAA >60 05/02/2015   GFRAA >60 05/02/2015    Lab Results  Component Value Date   WBC 12.7* 05/02/2015   NEUTROABS 10.7* 05/02/2015   HGB 10.4* 05/02/2015   HCT 30.5* 05/02/2015   MCV 96.3 05/02/2015   PLT 118* 05/02/2015     STUDIES: No results found.  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  PLAN:    1. Breast cancer: Given a triple negative status of her disease patient will definitely benefit from adjuvant chemotherapy. Patient has completed her MammoSite therapy and has tolerated it well. Pretreatment MUGA revealed an EF of 67%.  Proceed with cycle 3 of 4 of dose dense Adriamycin and Cytoxan today. Return to clinic in 2 days for Neulasta and then in 2 weeks for consideration of cycle 4. At the conclusion of cycle 4, we will proceed with 12 weekly cycles of Taxol 80 mg/m2. 2. Nausea: Continue Compazine as prescribed. 3. Anxiety: Monitor. 4. Leukocytosis: Secondary to Neulasta. 5. Anemia/thrombocytopenia: Secondary to chemotherapy, monitor.  Patient expressed understanding and was in agreement with this plan. She also understands that She can call clinic at any time with any questions, concerns, or complaints.   Breast cancer   Staging form: Breast, AJCC 7th Edition     Clinical stage from 03/26/2015: Stage IA (T1c, N0, M0) - Signed by Lloyd Huger, MD on 03/26/2015   Lloyd Huger, MD   05/14/2015 6:45 PM

## 2015-05-15 ENCOUNTER — Other Ambulatory Visit: Payer: Self-pay | Admitting: Oncology

## 2015-05-16 ENCOUNTER — Inpatient Hospital Stay (HOSPITAL_BASED_OUTPATIENT_CLINIC_OR_DEPARTMENT_OTHER): Payer: BLUE CROSS/BLUE SHIELD | Admitting: Oncology

## 2015-05-16 ENCOUNTER — Other Ambulatory Visit: Payer: Self-pay | Admitting: *Deleted

## 2015-05-16 ENCOUNTER — Ambulatory Visit: Payer: BLUE CROSS/BLUE SHIELD

## 2015-05-16 ENCOUNTER — Inpatient Hospital Stay: Payer: BLUE CROSS/BLUE SHIELD

## 2015-05-16 VITALS — BP 105/67 | HR 101 | Temp 100.0°F | Resp 20 | Wt 148.1 lb

## 2015-05-16 DIAGNOSIS — C50511 Malignant neoplasm of lower-outer quadrant of right female breast: Secondary | ICD-10-CM | POA: Diagnosis not present

## 2015-05-16 DIAGNOSIS — Z418 Encounter for other procedures for purposes other than remedying health state: Secondary | ICD-10-CM | POA: Diagnosis not present

## 2015-05-16 DIAGNOSIS — Z171 Estrogen receptor negative status [ER-]: Secondary | ICD-10-CM | POA: Diagnosis not present

## 2015-05-16 DIAGNOSIS — Z79899 Other long term (current) drug therapy: Secondary | ICD-10-CM

## 2015-05-16 DIAGNOSIS — C50911 Malignant neoplasm of unspecified site of right female breast: Secondary | ICD-10-CM

## 2015-05-16 DIAGNOSIS — R110 Nausea: Secondary | ICD-10-CM | POA: Diagnosis not present

## 2015-05-16 DIAGNOSIS — D72829 Elevated white blood cell count, unspecified: Secondary | ICD-10-CM

## 2015-05-16 DIAGNOSIS — D6959 Other secondary thrombocytopenia: Secondary | ICD-10-CM

## 2015-05-16 LAB — CBC WITH DIFFERENTIAL/PLATELET
BAND NEUTROPHILS: 11 % — AB (ref 0–10)
BLASTS: 0 %
Basophils Absolute: 0 10*3/uL (ref 0.0–0.1)
Basophils Relative: 0 % (ref 0–1)
Eosinophils Absolute: 0 10*3/uL (ref 0.0–0.7)
Eosinophils Relative: 0 % (ref 0–5)
HCT: 26.8 % — ABNORMAL LOW (ref 35.0–47.0)
HEMOGLOBIN: 9.1 g/dL — AB (ref 12.0–16.0)
LYMPHS PCT: 7 % — AB (ref 12–46)
Lymphs Abs: 1.3 10*3/uL (ref 0.7–4.0)
MCH: 32.6 pg (ref 26.0–34.0)
MCHC: 34.1 g/dL (ref 32.0–36.0)
MCV: 95.6 fL (ref 80.0–100.0)
Metamyelocytes Relative: 4 %
Monocytes Absolute: 2.1 10*3/uL — ABNORMAL HIGH (ref 0.1–1.0)
Monocytes Relative: 12 % (ref 3–12)
Myelocytes: 2 %
NEUTROS ABS: 14.5 10*3/uL — AB (ref 1.7–7.7)
NRBC: 1 /100{WBCs} — AB
Neutrophils Relative %: 64 % (ref 43–77)
Other: 0 %
Platelets: 124 10*3/uL — ABNORMAL LOW (ref 150–440)
Promyelocytes Absolute: 0 %
RBC: 2.8 MIL/uL — AB (ref 3.80–5.20)
RDW: 12.9 % (ref 11.5–14.5)
Smear Review: ADEQUATE
WBC: 17.9 10*3/uL — AB (ref 3.6–11.0)

## 2015-05-16 LAB — COMPREHENSIVE METABOLIC PANEL
ALBUMIN: 3.8 g/dL (ref 3.5–5.0)
ALK PHOS: 113 U/L (ref 38–126)
ALT: 13 U/L — AB (ref 14–54)
ANION GAP: 5 (ref 5–15)
AST: 21 U/L (ref 15–41)
BUN: 7 mg/dL (ref 6–20)
CALCIUM: 8.4 mg/dL — AB (ref 8.9–10.3)
CO2: 29 mmol/L (ref 22–32)
Chloride: 100 mmol/L — ABNORMAL LOW (ref 101–111)
Creatinine, Ser: 0.53 mg/dL (ref 0.44–1.00)
GFR calc Af Amer: >60 mL/min (ref >60)
GFR calc non Af Amer: >60 mL/min (ref >60)
GLUCOSE: 124 mg/dL — AB (ref 65–99)
POTASSIUM: 3.5 mmol/L (ref 3.5–5.1)
SODIUM: 134 mmol/L — AB (ref 135–145)
TOTAL PROTEIN: 7.1 g/dL (ref 6.5–8.1)
Total Bilirubin: 0.5 mg/dL (ref 0.3–1.2)

## 2015-05-16 MED ORDER — SODIUM CHLORIDE 0.9 % IV SOLN
Freq: Once | INTRAVENOUS | Status: AC
  Administered 2015-05-16: 15:00:00 via INTRAVENOUS
  Filled 2015-05-16: qty 1000

## 2015-05-16 MED ORDER — DOXORUBICIN HCL CHEMO IV INJECTION 2 MG/ML
60.0000 mg/m2 | Freq: Once | INTRAVENOUS | Status: AC
  Administered 2015-05-16: 106 mg via INTRAVENOUS
  Filled 2015-05-16: qty 53

## 2015-05-16 MED ORDER — PALONOSETRON HCL INJECTION 0.25 MG/5ML
0.2500 mg | Freq: Once | INTRAVENOUS | Status: AC
  Administered 2015-05-16: 0.25 mg via INTRAVENOUS
  Filled 2015-05-16: qty 5

## 2015-05-16 MED ORDER — HEPARIN SOD (PORK) LOCK FLUSH 100 UNIT/ML IV SOLN
250.0000 [IU] | Freq: Once | INTRAVENOUS | Status: AC | PRN
  Administered 2015-05-16: 250 [IU]

## 2015-05-16 MED ORDER — HEPARIN SOD (PORK) LOCK FLUSH 100 UNIT/ML IV SOLN
INTRAVENOUS | Status: AC
Start: 2015-05-16 — End: 2015-05-16
  Filled 2015-05-16: qty 5

## 2015-05-16 MED ORDER — SODIUM CHLORIDE 0.9 % IV SOLN
Freq: Once | INTRAVENOUS | Status: AC
  Administered 2015-05-16: 15:00:00 via INTRAVENOUS
  Filled 2015-05-16: qty 5

## 2015-05-16 MED ORDER — SODIUM CHLORIDE 0.9 % IV SOLN
1000.0000 mg | Freq: Once | INTRAVENOUS | Status: AC
  Administered 2015-05-16: 1000 mg via INTRAVENOUS
  Filled 2015-05-16: qty 50

## 2015-05-16 MED ORDER — FIRST-DUKES MOUTHWASH MT SUSP: 5.0000 mL | Freq: Three times a day (TID) | OROMUCOSAL | Status: DC | PRN

## 2015-05-18 ENCOUNTER — Inpatient Hospital Stay: Payer: BLUE CROSS/BLUE SHIELD

## 2015-05-18 DIAGNOSIS — C50511 Malignant neoplasm of lower-outer quadrant of right female breast: Secondary | ICD-10-CM | POA: Diagnosis not present

## 2015-05-18 DIAGNOSIS — C50911 Malignant neoplasm of unspecified site of right female breast: Secondary | ICD-10-CM

## 2015-05-18 MED ORDER — PEGFILGRASTIM INJECTION 6 MG/0.6ML ~~LOC~~
6.0000 mg | PREFILLED_SYRINGE | Freq: Once | SUBCUTANEOUS | Status: AC
  Administered 2015-05-18: 6 mg via SUBCUTANEOUS
  Filled 2015-05-18: qty 0.6

## 2015-05-18 NOTE — Progress Notes (Signed)
[place]  Telephone:(336) [phone] Fax:(336) [phone]  ID: [name] OB: 01-25-1961  MR#: [id number]  AOZ#:[id number]  Patient Care Team: Maceo Pro, MD as PCP - General (Obstetrics and Gynecology) Gae Dry, MD as Referring Physician (Obstetrics and Gynecology) Robert Bellow, MD (General Surgery)  CHIEF COMPLAINT:  Chief Complaint  Patient presents with  . Follow-up    breast cancer    INTERVAL HISTORY: Patient returns to clinic today for further evaluation and consideration of cycle 4 of 4 of Adriamycin and Cytoxan. She continues to be highly anxious, but otherwise feels well. She is tolerating her treatments well without significant side effects. She has no neurologic complaints. She denies any recent fevers. She denies any chest pain or shortness of breath. She denies any vomiting, constipation, or diarrhea. She has no urinary complaints. Patient offers no further specific complaints.  REVIEW OF SYSTEMS:   Review of Systems  Constitutional: Negative.   Psychiatric/Behavioral: The patient is nervous/anxious.     As per HPI. Otherwise, a complete review of systems is negatve.  PAST MEDICAL HISTORY: Past Medical History  Diagnosis Date  . 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 Wide excision, sentinel node biopsy.  . Breast cancer     PAST SURGICAL HISTORY: Past Surgical History  Procedure Laterality Date  . Colonoscopy  2013    Dr Vira Agar  . Breast biopsy Right 02-09-15    wide excision/sentinel node biopsy.  . Portacath placement  03-29-15    Dr Bary Castilla    FAMILY HISTORY Family History  Problem Relation Age of Onset  . Hypertension Mother   . Hypertension Father   . Diabetes Father   . Heart failure Mother        ADVANCED DIRECTIVES:    HEALTH MAINTENANCE: History  Substance Use Topics  . Smoking status: Never Smoker   . Smokeless  tobacco: Never Used  . Alcohol Use: No     Colonoscopy:  PAP:  Bone density:  Lipid panel:  No Known Allergies  Current Outpatient Prescriptions  Medication Sig Dispense Refill  . acetaminophen (TYLENOL) 325 MG tablet Take 650 mg by mouth every 6 (six) hours as needed.    . DiphenhydrAMINE HCl, Sleep, (UNISOM SLEEPGELS PO) Take by mouth as needed.    . lidocaine-prilocaine (EMLA) cream Apply to port and cover with saran wrap 1-2 hours before chemotherapy  0  . loratadine (CLARITIN) 10 MG tablet Take 10 mg by mouth daily as needed for allergies.    Marland Kitchen omeprazole (PRILOSEC) 20 MG capsule Take 1 capsule (20 mg total) by mouth daily. May increase to twice a day if once daily does not help after 3 days (Patient taking differently: Take 20 mg by mouth as needed. May increase to twice a day if once daily does not help after 3 days) 45 capsule 2  . prochlorperazine (COMPAZINE) 10 MG tablet Take 1 tablet by mouth 3 (three) times daily as needed.  0  . Diphenhyd-Hydrocort-Nystatin (FIRST-DUKES MOUTHWASH) SUSP Use as directed 5 mLs in the mouth or throat 3 (three) times daily as needed. 237 mL 1   No current facility-administered medications for this visit.    OBJECTIVE: Filed Vitals:   05/16/15 1420  BP: 105/67  Pulse: 101  Temp: 100 F (37.8 C)  Resp: 20     Body mass index is 25.42 kg/(m^2).    ECOG FS:0 - Asymptomatic  General: Well-developed,  well-nourished, no acute distress. Eyes: Anicteric sclera. Breasts: Exam deferred today. Lungs: Clear to auscultation bilaterally. Heart: Regular rate and rhythm. No rubs, murmurs, or gallops. Abdomen: Soft, nontender, nondistended. No organomegaly noted, normoactive bowel sounds. Musculoskeletal: No edema, cyanosis, or clubbing. Neuro: Alert, answering all questions appropriately. Cranial nerves grossly intact. Skin: No rashes or petechiae noted. Psych: Normal affect.   LAB RESULTS:  Lab Results  Component Value Date   NA 134*  05/16/2015   K 3.5 05/16/2015   CL 100* 05/16/2015   CO2 29 05/16/2015   GLUCOSE 124* 05/16/2015   BUN 7 05/16/2015   CREATININE 0.53 05/16/2015   CALCIUM 8.4* 05/16/2015   PROT 7.1 05/16/2015   ALBUMIN 3.8 05/16/2015   AST 21 05/16/2015   ALT 13* 05/16/2015   ALKPHOS 113 05/16/2015   BILITOT 0.5 05/16/2015   GFRNONAA >60 05/16/2015   GFRAA >60 05/16/2015    Lab Results  Component Value Date   WBC 17.9* 05/16/2015   NEUTROABS 14.5* 05/16/2015   HGB 9.1* 05/16/2015   HCT 26.8* 05/16/2015   MCV 95.6 05/16/2015   PLT 124* 05/16/2015     STUDIES: No results found.  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  PLAN:    1. Breast cancer: Given a triple negative status of her disease patient will definitely benefit from adjuvant chemotherapy. Patient has completed her MammoSite therapy and has tolerated it well. Pretreatment MUGA revealed an EF of 67%.  Proceed with cycle 4 of 4 of dose dense Adriamycin and Cytoxan today. Return to clinic in 2 days for Neulasta and then in 2 weeks for consideration of cycle 1 of 12 of weekly 12 weekly Taxol 80 mg/m2. 2. Nausea: Continue Compazine as prescribed. 3. Anxiety: Monitor. 4. Leukocytosis: Secondary to Neulasta. 5. Anemia/thrombocytopenia: Secondary to chemotherapy, monitor.  Patient expressed understanding and was in agreement with this plan. She also understands that She can call clinic at any time with any questions, concerns, or complaints.   Breast cancer   Staging form: Breast, AJCC 7th Edition     Clinical stage from 03/26/2015: Stage IA (T1c, N0, M0) - Signed by Lloyd Huger, MD on 03/26/2015   Lloyd Huger, MD   05/18/2015 5:18 PM

## 2015-05-28 ENCOUNTER — Other Ambulatory Visit: Payer: Self-pay | Admitting: Oncology

## 2015-05-28 ENCOUNTER — Encounter: Payer: Self-pay | Admitting: Family Medicine

## 2015-05-28 ENCOUNTER — Telehealth: Payer: Self-pay | Admitting: *Deleted

## 2015-05-28 ENCOUNTER — Other Ambulatory Visit: Payer: Self-pay | Admitting: Family Medicine

## 2015-05-28 ENCOUNTER — Inpatient Hospital Stay: Payer: BLUE CROSS/BLUE SHIELD

## 2015-05-28 ENCOUNTER — Inpatient Hospital Stay (HOSPITAL_BASED_OUTPATIENT_CLINIC_OR_DEPARTMENT_OTHER): Payer: BLUE CROSS/BLUE SHIELD | Admitting: Family Medicine

## 2015-05-28 VITALS — BP 102/58 | HR 89 | Temp 99.2°F | Resp 18 | Wt 140.2 lb

## 2015-05-28 DIAGNOSIS — E860 Dehydration: Secondary | ICD-10-CM

## 2015-05-28 DIAGNOSIS — Z418 Encounter for other procedures for purposes other than remedying health state: Secondary | ICD-10-CM

## 2015-05-28 DIAGNOSIS — R55 Syncope and collapse: Secondary | ICD-10-CM

## 2015-05-28 DIAGNOSIS — C50511 Malignant neoplasm of lower-outer quadrant of right female breast: Secondary | ICD-10-CM

## 2015-05-28 DIAGNOSIS — Z171 Estrogen receptor negative status [ER-]: Secondary | ICD-10-CM | POA: Diagnosis not present

## 2015-05-28 DIAGNOSIS — D6959 Other secondary thrombocytopenia: Secondary | ICD-10-CM

## 2015-05-28 DIAGNOSIS — D72829 Elevated white blood cell count, unspecified: Secondary | ICD-10-CM

## 2015-05-28 DIAGNOSIS — Z79899 Other long term (current) drug therapy: Secondary | ICD-10-CM

## 2015-05-28 DIAGNOSIS — R110 Nausea: Secondary | ICD-10-CM

## 2015-05-28 DIAGNOSIS — R531 Weakness: Secondary | ICD-10-CM

## 2015-05-28 DIAGNOSIS — C50911 Malignant neoplasm of unspecified site of right female breast: Secondary | ICD-10-CM

## 2015-05-28 DIAGNOSIS — D630 Anemia in neoplastic disease: Secondary | ICD-10-CM

## 2015-05-28 HISTORY — DX: Dehydration: E86.0

## 2015-05-28 LAB — CBC WITH DIFFERENTIAL/PLATELET
Basophils Absolute: 0 K/uL (ref 0–0.1)
Basophils Relative: 1 %
Eosinophils Absolute: 0 K/uL (ref 0–0.7)
Eosinophils Relative: 0 %
HCT: 19.6 % — ABNORMAL LOW (ref 35.0–47.0)
Hemoglobin: 6.9 g/dL — ABNORMAL LOW (ref 12.0–16.0)
Lymphocytes Relative: 3 %
Lymphs Abs: 0.1 K/uL — ABNORMAL LOW (ref 1.0–3.6)
MCH: 33.2 pg (ref 26.0–34.0)
MCHC: 35.5 g/dL (ref 32.0–36.0)
MCV: 93.5 fL (ref 80.0–100.0)
Monocytes Absolute: 0.5 K/uL (ref 0.2–0.9)
Monocytes Relative: 12 %
Neutro Abs: 3.6 K/uL (ref 1.4–6.5)
Neutrophils Relative %: 84 %
Platelets: 23 K/uL — CL (ref 150–440)
RBC: 2.09 MIL/uL — ABNORMAL LOW (ref 3.80–5.20)
RDW: 13.3 % (ref 11.5–14.5)
WBC: 4.2 K/uL (ref 3.6–11.0)

## 2015-05-28 LAB — COMPREHENSIVE METABOLIC PANEL WITH GFR
ALT: 9 U/L — ABNORMAL LOW (ref 14–54)
AST: 17 U/L (ref 15–41)
Albumin: 3.4 g/dL — ABNORMAL LOW (ref 3.5–5.0)
Alkaline Phosphatase: 90 U/L (ref 38–126)
Anion gap: 11 (ref 5–15)
BUN: 9 mg/dL (ref 6–20)
CO2: 27 mmol/L (ref 22–32)
Calcium: 8.7 mg/dL — ABNORMAL LOW (ref 8.9–10.3)
Chloride: 97 mmol/L — ABNORMAL LOW (ref 101–111)
Creatinine, Ser: 0.46 mg/dL (ref 0.44–1.00)
GFR calc Af Amer: >60 mL/min (ref >60)
GFR calc non Af Amer: >60 mL/min (ref >60)
Glucose, Bld: 104 mg/dL — ABNORMAL HIGH (ref 65–99)
Potassium: 3.3 mmol/L — ABNORMAL LOW (ref 3.5–5.1)
Sodium: 135 mmol/L (ref 135–145)
Total Bilirubin: 0.6 mg/dL (ref 0.3–1.2)
Total Protein: 6.7 g/dL (ref 6.5–8.1)

## 2015-05-28 LAB — SAMPLE TO BLOOD BANK

## 2015-05-28 LAB — MAGNESIUM: Magnesium: 2.1 mg/dL (ref 1.7–2.4)

## 2015-05-28 MED ORDER — SODIUM CHLORIDE 0.9 % IJ SOLN
10.0000 mL | INTRAMUSCULAR | Status: DC | PRN
  Administered 2015-05-28: 10 mL via INTRAVENOUS
  Filled 2015-05-28: qty 10

## 2015-05-28 MED ORDER — SODIUM CHLORIDE 0.9 % IV SOLN
Freq: Once | INTRAVENOUS | Status: AC
  Administered 2015-05-28: 12:00:00 via INTRAVENOUS
  Filled 2015-05-28: qty 1000

## 2015-05-28 MED ORDER — HEPARIN SOD (PORK) LOCK FLUSH 100 UNIT/ML IV SOLN
500.0000 [IU] | Freq: Once | INTRAVENOUS | Status: AC
  Administered 2015-05-28: 500 [IU] via INTRAVENOUS
  Filled 2015-05-28: qty 5

## 2015-05-28 MED ORDER — POTASSIUM CHLORIDE 10 MEQ/100ML IV SOLN
10.0000 meq | Freq: Once | INTRAVENOUS | Status: AC
  Administered 2015-05-28: 10 meq via INTRAVENOUS
  Filled 2015-05-28: qty 100

## 2015-05-28 NOTE — Progress Notes (Signed)
Patient reports not getting any strength or appetite back since her last treatment.  When she stands feels dizzy and the longer she stands the weaker she feels.  On Friday she started having a throbbing sensation in her head and that is when the dizziness started.  Had a near syncope episode on Saturday when her husband was able to assist her back in the bed but on Sunday while stepping out of the shower she started feeling weak/dizzy then she woke up on the floor.

## 2015-05-28 NOTE — Progress Notes (Signed)
[facility]  [phone]) [phone]  Fax:(336) [phone]     Wanda Lopez DOB: 28-Aug-1961  MR#: [id number]  JKK#:[id number]  Patient Care Team: Maceo Pro, MD as PCP - General (Obstetrics and Gynecology) Gae Dry, MD as Referring Physician (Obstetrics and Gynecology) Robert Bellow, MD (General Surgery)  CHIEF COMPLAINT:  Patient is being seen today for continued weakness following chemotherapy with Adriamycin and Cytoxan on June 15th.  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 She reports in the weekend having intermittent episodes of extreme weakness that forced her to lay down in the bed for improvement. Currently she is sitting in chair and reports that she would feel much better lying flat. She states that over the weekend on Sunday she had an episode getting out of the shower and passed out falling into the floor.   REVIEW OF SYSTEMS:   Review of Systems  Constitutional: Positive for malaise/fatigue. Negative for fever, chills, weight loss and diaphoresis.  HENT: Negative for congestion, ear discharge, ear pain, hearing loss, nosebleeds, sore throat and tinnitus.   Eyes: Negative for blurred vision, double vision, photophobia, pain, discharge and redness.  Respiratory: Negative for cough, hemoptysis, sputum production, shortness of breath, wheezing and stridor.   Cardiovascular: Negative for chest pain, palpitations, orthopnea, claudication, leg swelling and PND.  Gastrointestinal: Negative for heartburn, nausea, vomiting, abdominal pain, diarrhea, constipation, blood in stool and melena.  Genitourinary: Negative.   Musculoskeletal: Negative.   Skin: Negative.   Neurological: Positive for dizziness, loss of consciousness and weakness. Negative for tingling, focal weakness, seizures and headaches.       See history of present  illness  Endo/Heme/Allergies: Does not bruise/bleed easily.  Psychiatric/Behavioral: Negative for depression. The patient is not nervous/anxious and does not have insomnia.     As per HPI. Otherwise, a complete review of systems is negatve.  ONCOLOGY HISTORY:  No history exists.    PAST MEDICAL HISTORY: Past Medical History  Diagnosis Date  . 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 Wide excision, sentinel node biopsy.  . Breast cancer   . Dehydration 05/28/2015    PAST SURGICAL HISTORY: Past Surgical History  Procedure Laterality Date  . Colonoscopy  2013    Dr Vira Agar  . Breast biopsy Right 02-09-15    wide excision/sentinel node biopsy.  . Portacath placement  03-29-15    Dr Bary Castilla    FAMILY HISTORY Family History  Problem Relation Age of Onset  . Hypertension Mother   . Hypertension Father   . Diabetes Father   . Heart failure Mother     GYNECOLOGIC HISTORY:  No LMP recorded. Patient is not currently having periods (Reason: Chemotherapy).     ADVANCED DIRECTIVES:    HEALTH MAINTENANCE: History  Substance Use Topics  . Smoking status: Never Smoker   . Smokeless tobacco: Never Used  . Alcohol Use: No     Colonoscopy:  PAP:  Bone density:  Lipid panel:  No Known Allergies  Current Outpatient Prescriptions  Medication Sig Dispense Refill  . acetaminophen (TYLENOL) 325 MG tablet Take 650 mg by mouth every 6 (six) hours as needed.    . Diphenhyd-Hydrocort-Nystatin (FIRST-DUKES MOUTHWASH) SUSP Use as directed 5 mLs in the mouth or throat 3 (three) times daily as needed. 237 mL 1  .  DiphenhydrAMINE HCl, Sleep, (UNISOM SLEEPGELS PO) Take by mouth as needed.    . lidocaine-prilocaine (EMLA) cream Apply to port and cover with saran wrap 1-2 hours before chemotherapy  0  . loratadine (CLARITIN) 10 MG tablet Take 10 mg by mouth daily as needed for allergies.    Marland Kitchen omeprazole (PRILOSEC)  20 MG capsule Take 1 capsule (20 mg total) by mouth daily. May increase to twice a day if once daily does not help after 3 days (Patient taking differently: Take 20 mg by mouth as needed. May increase to twice a day if once daily does not help after 3 days) 45 capsule 2  . prochlorperazine (COMPAZINE) 10 MG tablet Take 1 tablet by mouth 3 (three) times daily as needed.  0   No current facility-administered medications for this visit.    OBJECTIVE: There were no vitals taken for this visit.   There is no weight on file to calculate BMI.    ECOG FS:2 - Symptomatic, <50% confined to bed  General: Well-developed, well-nourished, no acute distress. patient is in a wheelchair due to extreme weakness  Eyes: Pink conjunctiva, anicteric sclera. HEENT: Normocephalic, moist mucous membranes, clear oropharnyx. Heart: Regular rate and rhythm. No rubs, murmurs, or gallops. Abdomen: Soft, nontender, nondistended. No organomegaly noted, normoactive bowel sounds. Musculoskeletal: No edema, cyanosis, or clubbing. Neuro: Alert, answering all questions appropriately. Cranial nerves grossly intact. Skin: No rashes or petechiae noted. Psych: Normal affect.    LAB RESULTS:  Appointment on 05/28/2015  Component Date Value Ref Range Status  . Sodium 05/28/2015 135  135 - 145 mmol/L Final  . Potassium 05/28/2015 3.3* 3.5 - 5.1 mmol/L Final  . Chloride 05/28/2015 97* 101 - 111 mmol/L Final  . CO2 05/28/2015 27  22 - 32 mmol/L Final  . Glucose, Bld 05/28/2015 104* 65 - 99 mg/dL Final  . BUN 05/28/2015 9  6 - 20 mg/dL Final  . Creatinine, Ser 05/28/2015 0.46  0.44 - 1.00 mg/dL Final  . Calcium 05/28/2015 8.7* 8.9 - 10.3 mg/dL Final  . Total Protein 05/28/2015 6.7  6.5 - 8.1 g/dL Final  . Albumin 05/28/2015 3.4* 3.5 - 5.0 g/dL Final  . AST 05/28/2015 17  15 - 41 U/L Final  . ALT 05/28/2015 9* 14 - 54 U/L Final  . Alkaline Phosphatase 05/28/2015 90  38 - 126 U/L Final  . Total Bilirubin 05/28/2015 0.6  0.3 -  1.2 mg/dL Final  . GFR calc non Af Amer 05/28/2015 >60  >60 mL/min Final  . GFR calc Af Amer 05/28/2015 >60  >60 mL/min Final   Comment: (NOTE) The eGFR has been calculated using the CKD EPI equation. This calculation has not been validated in all clinical situations. eGFR's persistently <60 mL/min signify possible Chronic Kidney Disease.   . Anion gap 05/28/2015 11  5 - 15 Final  . Magnesium 05/28/2015 2.1  1.7 - 2.4 mg/dL Final    STUDIES: No results found.  ASSESSMENT:  Stage IA triple negative breast cancer.   PLAN:   1. Dehydration. Patient completed cycle 4 of 4 of Adriamycin and Cytoxan for treatment of stage IA triple negative breast cancer on 05/16/2015. Over the weekend she reported been increasingly weak and with changes of position felt lightheaded as if she would pass out. Symptoms resolved when patient was lying flat. On Sunday evening she did pass out after getting out of the shower. Patient's labs are reported as stable potassium is 3.3, we will supplement with 10 meq of  potassium IV. We'll also hydrate with at least 1 L of normal saline.   Patient has follow-up appointment for 05/30/2015 with Dr. Grayland Ormond for initiation of Taxol treatments weekly 12. If she is feeling no better she has been instructed to contact our office.   Patient expressed understanding and was in agreement with this plan. She also understands that She can call clinic at any time with any questions, concerns, or complaints.   Dr. Oliva Bustard was available for consultation and review of plan of care for this patient.  Breast cancer   Staging form: Breast, AJCC 7th Edition     Clinical stage from 03/26/2015: Stage IA (T1c, N0, M0) - Signed by Lloyd Huger, MD on 03/26/2015   Evlyn Kanner, NP   05/28/2015 11:48 AM

## 2015-05-28 NOTE — Telephone Encounter (Signed)
Pt instructed to go to [facility] to see L Herring , NP and have lab and IVF. This is per Dr Oliva Bustard and Magda Paganini is ok with this. Patient agree to go to [facility]

## 2015-05-29 ENCOUNTER — Inpatient Hospital Stay: Payer: BLUE CROSS/BLUE SHIELD

## 2015-05-29 VITALS — BP 88/65 | HR 92 | Temp 99.5°F | Resp 18

## 2015-05-29 DIAGNOSIS — C50511 Malignant neoplasm of lower-outer quadrant of right female breast: Secondary | ICD-10-CM | POA: Diagnosis not present

## 2015-05-29 DIAGNOSIS — D630 Anemia in neoplastic disease: Secondary | ICD-10-CM

## 2015-05-29 LAB — PREPARE RBC (CROSSMATCH)

## 2015-05-29 LAB — ABO/RH: ABO/RH(D): O POS

## 2015-05-29 MED ORDER — DIPHENHYDRAMINE HCL 25 MG PO CAPS
ORAL_CAPSULE | ORAL | Status: AC
  Filled 2015-05-29: qty 1

## 2015-05-29 MED ORDER — HEPARIN SOD (PORK) LOCK FLUSH 100 UNIT/ML IV SOLN
500.0000 [IU] | Freq: Once | INTRAVENOUS | Status: AC
  Administered 2015-05-29: 500 [IU] via INTRAVENOUS

## 2015-05-29 MED ORDER — SODIUM CHLORIDE 0.9 % IV SOLN
250.0000 mL | Freq: Once | INTRAVENOUS | Status: AC
  Administered 2015-05-29: 20 mL/h via INTRAVENOUS
  Filled 2015-05-29: qty 250

## 2015-05-29 MED ORDER — ACETAMINOPHEN 325 MG PO TABS
650.0000 mg | ORAL_TABLET | Freq: Once | ORAL | Status: AC
  Administered 2015-05-29: 650 mg via ORAL

## 2015-05-29 MED ORDER — DIPHENHYDRAMINE HCL 25 MG PO CAPS
25.0000 mg | ORAL_CAPSULE | Freq: Once | ORAL | Status: AC
  Administered 2015-05-29: 25 mg via ORAL

## 2015-05-29 MED ORDER — HEPARIN SOD (PORK) LOCK FLUSH 100 UNIT/ML IV SOLN
INTRAVENOUS | Status: AC
  Filled 2015-05-29: qty 5

## 2015-05-29 MED ORDER — ACETAMINOPHEN 325 MG PO TABS
ORAL_TABLET | ORAL | Status: AC
  Filled 2015-05-29: qty 2

## 2015-05-30 ENCOUNTER — Inpatient Hospital Stay: Payer: BLUE CROSS/BLUE SHIELD

## 2015-05-30 ENCOUNTER — Inpatient Hospital Stay (HOSPITAL_BASED_OUTPATIENT_CLINIC_OR_DEPARTMENT_OTHER): Payer: BLUE CROSS/BLUE SHIELD | Admitting: Oncology

## 2015-05-30 VITALS — BP 99/61 | HR 93 | Temp 99.0°F | Resp 18 | Wt 142.2 lb

## 2015-05-30 DIAGNOSIS — C50911 Malignant neoplasm of unspecified site of right female breast: Secondary | ICD-10-CM

## 2015-05-30 DIAGNOSIS — C50511 Malignant neoplasm of lower-outer quadrant of right female breast: Secondary | ICD-10-CM | POA: Diagnosis not present

## 2015-05-30 DIAGNOSIS — Z79899 Other long term (current) drug therapy: Secondary | ICD-10-CM

## 2015-05-30 DIAGNOSIS — Z418 Encounter for other procedures for purposes other than remedying health state: Secondary | ICD-10-CM | POA: Diagnosis not present

## 2015-05-30 DIAGNOSIS — Z171 Estrogen receptor negative status [ER-]: Secondary | ICD-10-CM | POA: Diagnosis not present

## 2015-05-30 LAB — COMPREHENSIVE METABOLIC PANEL
ALBUMIN: 3.5 g/dL (ref 3.5–5.0)
ALT: 9 U/L — ABNORMAL LOW (ref 14–54)
ANION GAP: 7 (ref 5–15)
AST: 19 U/L (ref 15–41)
Alkaline Phosphatase: 94 U/L (ref 38–126)
BILIRUBIN TOTAL: 0.7 mg/dL (ref 0.3–1.2)
BUN: 8 mg/dL (ref 6–20)
CHLORIDE: 101 mmol/L (ref 101–111)
CO2: 25 mmol/L (ref 22–32)
CREATININE: 0.53 mg/dL (ref 0.44–1.00)
Calcium: 8 mg/dL — ABNORMAL LOW (ref 8.9–10.3)
GFR calc Af Amer: >60 mL/min (ref >60)
Glucose, Bld: 110 mg/dL — ABNORMAL HIGH (ref 65–99)
Potassium: 3.4 mmol/L — ABNORMAL LOW (ref 3.5–5.1)
Sodium: 133 mmol/L — ABNORMAL LOW (ref 135–145)
Total Protein: 6.9 g/dL (ref 6.5–8.1)

## 2015-05-30 LAB — CBC WITH DIFFERENTIAL/PLATELET
BASOS ABS: 0 10*3/uL (ref 0–0.1)
Basophils Relative: 0 %
EOS PCT: 0 %
Eosinophils Absolute: 0 10*3/uL (ref 0–0.7)
HCT: 24.2 % — ABNORMAL LOW (ref 35.0–47.0)
Hemoglobin: 8.4 g/dL — ABNORMAL LOW (ref 12.0–16.0)
LYMPHS ABS: 0.1 10*3/uL — AB (ref 1.0–3.6)
LYMPHS PCT: 2 %
MCH: 32.3 pg (ref 26.0–34.0)
MCHC: 34.6 g/dL (ref 32.0–36.0)
MCV: 93.4 fL (ref 80.0–100.0)
Monocytes Absolute: 1 10*3/uL — ABNORMAL HIGH (ref 0.2–0.9)
Monocytes Relative: 13 %
NEUTROS PCT: 85 %
Neutro Abs: 6.7 10*3/uL — ABNORMAL HIGH (ref 1.4–6.5)
Platelets: 55 10*3/uL — ABNORMAL LOW (ref 150–440)
RBC: 2.59 MIL/uL — AB (ref 3.80–5.20)
RDW: 13.7 % (ref 11.5–14.5)
WBC: 7.9 10*3/uL (ref 3.6–11.0)

## 2015-05-30 LAB — TYPE AND SCREEN
ABO/RH(D): O POS
ANTIBODY SCREEN: NEGATIVE
Unit division: 0

## 2015-05-30 MED ORDER — HEPARIN SOD (PORK) LOCK FLUSH 100 UNIT/ML IV SOLN: 500.0000 [IU] | Freq: Once | INTRAVENOUS | Status: DC | PRN

## 2015-05-30 MED ORDER — HEPARIN SOD (PORK) LOCK FLUSH 100 UNIT/ML IV SOLN
INTRAVENOUS | Status: AC
  Filled 2015-05-30: qty 5

## 2015-05-30 MED ORDER — SODIUM CHLORIDE 0.9 % IJ SOLN
10.0000 mL | INTRAMUSCULAR | Status: DC | PRN
  Administered 2015-05-30: 10 mL via INTRAVENOUS
  Filled 2015-05-30: qty 10

## 2015-05-30 MED ORDER — SODIUM CHLORIDE 0.9 % IJ SOLN
10.0000 mL | INTRAMUSCULAR | Status: DC | PRN
  Filled 2015-05-30: qty 10

## 2015-05-30 MED ORDER — HEPARIN SOD (PORK) LOCK FLUSH 100 UNIT/ML IV SOLN
500.0000 [IU] | Freq: Once | INTRAVENOUS | Status: AC
  Administered 2015-05-30: 500 [IU] via INTRAVENOUS

## 2015-05-30 NOTE — Progress Notes (Signed)
Patient's weakness did improve after the blood transfusion yesterday she still is fatigued but not as bad.

## 2015-06-01 ENCOUNTER — Encounter: Payer: Self-pay | Admitting: Oncology

## 2015-06-06 ENCOUNTER — Inpatient Hospital Stay: Payer: BLUE CROSS/BLUE SHIELD | Attending: Oncology

## 2015-06-06 ENCOUNTER — Inpatient Hospital Stay: Payer: BLUE CROSS/BLUE SHIELD

## 2015-06-06 ENCOUNTER — Inpatient Hospital Stay (HOSPITAL_BASED_OUTPATIENT_CLINIC_OR_DEPARTMENT_OTHER): Payer: BLUE CROSS/BLUE SHIELD | Admitting: Oncology

## 2015-06-06 VITALS — BP 92/61 | HR 94 | Resp 18

## 2015-06-06 VITALS — BP 92/60 | HR 86 | Temp 98.5°F | Resp 16 | Wt 138.2 lb

## 2015-06-06 DIAGNOSIS — D6481 Anemia due to antineoplastic chemotherapy: Secondary | ICD-10-CM | POA: Insufficient documentation

## 2015-06-06 DIAGNOSIS — R110 Nausea: Secondary | ICD-10-CM | POA: Insufficient documentation

## 2015-06-06 DIAGNOSIS — Z171 Estrogen receptor negative status [ER-]: Secondary | ICD-10-CM | POA: Diagnosis not present

## 2015-06-06 DIAGNOSIS — I959 Hypotension, unspecified: Secondary | ICD-10-CM | POA: Insufficient documentation

## 2015-06-06 DIAGNOSIS — Z79899 Other long term (current) drug therapy: Secondary | ICD-10-CM | POA: Diagnosis not present

## 2015-06-06 DIAGNOSIS — C50911 Malignant neoplasm of unspecified site of right female breast: Secondary | ICD-10-CM

## 2015-06-06 DIAGNOSIS — D701 Agranulocytosis secondary to cancer chemotherapy: Secondary | ICD-10-CM | POA: Insufficient documentation

## 2015-06-06 DIAGNOSIS — C50511 Malignant neoplasm of lower-outer quadrant of right female breast: Secondary | ICD-10-CM | POA: Insufficient documentation

## 2015-06-06 DIAGNOSIS — Z5111 Encounter for antineoplastic chemotherapy: Secondary | ICD-10-CM | POA: Insufficient documentation

## 2015-06-06 LAB — COMPREHENSIVE METABOLIC PANEL
ALK PHOS: 71 U/L (ref 38–126)
ALT: 22 U/L (ref 14–54)
ANION GAP: 9 (ref 5–15)
AST: 50 U/L — ABNORMAL HIGH (ref 15–41)
Albumin: 3.2 g/dL — ABNORMAL LOW (ref 3.5–5.0)
BILIRUBIN TOTAL: 0.6 mg/dL (ref 0.3–1.2)
BUN: 11 mg/dL (ref 6–20)
CHLORIDE: 100 mmol/L — AB (ref 101–111)
CO2: 26 mmol/L (ref 22–32)
Calcium: 8.5 mg/dL — ABNORMAL LOW (ref 8.9–10.3)
Creatinine, Ser: 0.52 mg/dL (ref 0.44–1.00)
GFR calc Af Amer: >60 mL/min (ref >60)
GFR calc non Af Amer: >60 mL/min (ref >60)
GLUCOSE: 119 mg/dL — AB (ref 65–99)
POTASSIUM: 3.5 mmol/L (ref 3.5–5.1)
SODIUM: 135 mmol/L (ref 135–145)
TOTAL PROTEIN: 6.8 g/dL (ref 6.5–8.1)

## 2015-06-06 LAB — CBC WITH DIFFERENTIAL/PLATELET
Basophils Absolute: 0 10*3/uL (ref 0–0.1)
Basophils Relative: 0 %
EOS PCT: 0 %
Eosinophils Absolute: 0 10*3/uL (ref 0–0.7)
HEMATOCRIT: 26.4 % — AB (ref 35.0–47.0)
Hemoglobin: 8.8 g/dL — ABNORMAL LOW (ref 12.0–16.0)
LYMPHS ABS: 0.2 10*3/uL — AB (ref 1.0–3.6)
LYMPHS PCT: 5 %
MCH: 32.3 pg (ref 26.0–34.0)
MCHC: 33.3 g/dL (ref 32.0–36.0)
MCV: 97 fL (ref 80.0–100.0)
Monocytes Absolute: 0.8 10*3/uL (ref 0.2–0.9)
Monocytes Relative: 14 %
NEUTROS ABS: 4.4 10*3/uL (ref 1.4–6.5)
Neutrophils Relative %: 81 %
PLATELETS: 167 10*3/uL (ref 150–440)
RBC: 2.73 MIL/uL — AB (ref 3.80–5.20)
RDW: 16.7 % — ABNORMAL HIGH (ref 11.5–14.5)
WBC: 5.4 10*3/uL (ref 3.6–11.0)

## 2015-06-06 MED ORDER — FAMOTIDINE IN NACL 20-0.9 MG/50ML-% IV SOLN
20.0000 mg | Freq: Once | INTRAVENOUS | Status: AC
  Administered 2015-06-06: 20 mg via INTRAVENOUS
  Filled 2015-06-06: qty 50

## 2015-06-06 MED ORDER — SODIUM CHLORIDE 0.9 % IV SOLN
Freq: Once | INTRAVENOUS | Status: AC
  Administered 2015-06-06: 15:00:00 via INTRAVENOUS
  Filled 2015-06-06: qty 1000

## 2015-06-06 MED ORDER — SODIUM CHLORIDE 0.9 % IV SOLN
Freq: Once | INTRAVENOUS | Status: AC
  Administered 2015-06-06: 16:00:00 via INTRAVENOUS
  Filled 2015-06-06: qty 4

## 2015-06-06 MED ORDER — DIPHENHYDRAMINE HCL 50 MG/ML IJ SOLN
25.0000 mg | Freq: Once | INTRAMUSCULAR | Status: AC
  Administered 2015-06-06: 25 mg via INTRAVENOUS
  Filled 2015-06-06: qty 1

## 2015-06-06 MED ORDER — SODIUM CHLORIDE 0.9 % IJ SOLN
10.0000 mL | INTRAMUSCULAR | Status: DC | PRN
  Administered 2015-06-06: 10 mL
  Filled 2015-06-06: qty 10

## 2015-06-06 MED ORDER — MEGESTROL ACETATE 40 MG PO TABS: 40.0000 mg | ORAL_TABLET | Freq: Every day | ORAL | Status: DC

## 2015-06-06 MED ORDER — DEXTROSE 5 % IV SOLN
80.0000 mg/m2 | Freq: Once | INTRAVENOUS | Status: AC
  Administered 2015-06-06: 138 mg via INTRAVENOUS
  Filled 2015-06-06: qty 23

## 2015-06-06 MED ORDER — HEPARIN SOD (PORK) LOCK FLUSH 100 UNIT/ML IV SOLN
500.0000 [IU] | Freq: Once | INTRAVENOUS | Status: AC | PRN
  Administered 2015-06-06: 500 [IU]

## 2015-06-06 NOTE — Progress Notes (Signed)
Patient has a decrease in appetite and not able to eat much or tolerate much of the meal supplement drinks.

## 2015-06-08 NOTE — Progress Notes (Signed)
[facility]  [phone] Fax:(336) [phone]  ID: Wanda Lopez OB: [birth year]  MR#: [id number]  HAF#:[id number]  Patient Care Team: Maceo Pro, MD as PCP - General (Obstetrics and Gynecology) Gae Dry, MD as Referring Physician (Obstetrics and Gynecology) Robert Bellow, MD (General Surgery)  CHIEF COMPLAINT:  Chief Complaint  Patient presents with  . Follow-up    breast cancer    INTERVAL HISTORY: Patient returns to clinic today for further evaluation and consideration of cycle 1 of 12 of weekly Taxol. She continues to feel weak and fatigued, but is improved since receiving IV fluids last week. She does not complain of dizziness today. She continues to be highly anxious. She has no neurologic complaints. She denies any recent fevers. She denies any chest pain or shortness of breath. She denies any vomiting, constipation, or diarrhea. She has no urinary complaints. Patient offers no further specific complaints.  REVIEW OF SYSTEMS:   Review of Systems  Constitutional: Positive for malaise/fatigue.  Neurological: Positive for weakness.  Psychiatric/Behavioral: The patient is nervous/anxious.     As per HPI. Otherwise, a complete review of systems is negatve.  PAST MEDICAL HISTORY: Past Medical History  Diagnosis Date  . 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 Wide excision, sentinel node biopsy.  . Breast cancer   . Dehydration 05/28/2015    PAST SURGICAL HISTORY: Past Surgical History  Procedure Laterality Date  . Colonoscopy  2013    Dr Vira Agar  . Breast biopsy Right 02-09-15    wide excision/sentinel node biopsy.  . Portacath placement  03-29-15    Dr Bary Castilla    FAMILY HISTORY Family History  Problem Relation Age of Onset  . Hypertension Mother   . Hypertension Father   . Diabetes Father   . Heart failure Mother        ADVANCED  DIRECTIVES:    HEALTH MAINTENANCE: History  Substance Use Topics  . Smoking status: Never Smoker   . Smokeless tobacco: Never Used  . Alcohol Use: No     Colonoscopy:  PAP:  Bone density:  Lipid panel:  No Known Allergies  Current Outpatient Prescriptions  Medication Sig Dispense Refill  . acetaminophen (TYLENOL) 325 MG tablet Take 650 mg by mouth every 6 (six) hours as needed.    . Diphenhyd-Hydrocort-Nystatin (FIRST-DUKES MOUTHWASH) SUSP Use as directed 5 mLs in the mouth or throat 3 (three) times daily as needed. 237 mL 1  . DiphenhydrAMINE HCl, Sleep, (UNISOM SLEEPGELS PO) Take by mouth as needed.    . lidocaine-prilocaine (EMLA) cream Apply to port and cover with saran wrap 1-2 hours before chemotherapy  0  . loratadine (CLARITIN) 10 MG tablet Take 10 mg by mouth daily as needed for allergies.    Marland Kitchen omeprazole (PRILOSEC) 20 MG capsule Take 1 capsule (20 mg total) by mouth daily. May increase to twice a day if once daily does not help after 3 days (Patient taking differently: Take 20 mg by mouth as needed. May increase to twice a day if once daily does not help after 3 days) 45 capsule 2  . prochlorperazine (COMPAZINE) 10 MG tablet Take 1 tablet by mouth 3 (three) times daily as needed.  0  . megestrol (MEGACE) 40 MG tablet Take 1 tablet (40 mg total) by mouth daily. 30 tablet 1   No current facility-administered medications for this visit.    OBJECTIVE: Filed Vitals:  05/30/15 1426  BP: 99/61  Pulse: 93  Temp: 99 F (37.2 C)  Resp: 18     Body mass index is 24.4 kg/(m^2).    ECOG FS:0 - Asymptomatic  General: Well-developed, well-nourished, no acute distress. Eyes: Anicteric sclera. Breasts: Exam deferred today. Lungs: Clear to auscultation bilaterally. Heart: Regular rate and rhythm. No rubs, murmurs, or gallops. Abdomen: Soft, nontender, nondistended. No organomegaly noted, normoactive bowel sounds. Musculoskeletal: No edema, cyanosis, or clubbing. Neuro:  Alert, answering all questions appropriately. Cranial nerves grossly intact. Skin: No rashes or petechiae noted. Psych: Normal affect.   LAB RESULTS:  Lab Results  Component Value Date   NA 135 06/06/2015   K 3.5 06/06/2015   CL 100* 06/06/2015   CO2 26 06/06/2015   GLUCOSE 119* 06/06/2015   BUN 11 06/06/2015   CREATININE 0.52 06/06/2015   CALCIUM 8.5* 06/06/2015   PROT 6.8 06/06/2015   ALBUMIN 3.2* 06/06/2015   AST 50* 06/06/2015   ALT 22 06/06/2015   ALKPHOS 71 06/06/2015   BILITOT 0.6 06/06/2015   GFRNONAA >60 06/06/2015   GFRAA >60 06/06/2015    Lab Results  Component Value Date   WBC 5.4 06/06/2015   NEUTROABS 4.4 06/06/2015   HGB 8.8* 06/06/2015   HCT 26.4* 06/06/2015   MCV 97.0 06/06/2015   PLT 167 06/06/2015     STUDIES: No results found.  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  PLAN:    1. Breast cancer: Given a triple negative status of her disease patient will definitely benefit from adjuvant chemotherapy. Patient has completed her MammoSite therapy and has tolerated it well. Pretreatment MUGA revealed an EF of 67%.  She has also completed 4 cycles of Adriamycin and Cytoxan. Proceed with cycle 1 of 12 of weekly Taxol today. Return to clinic in 1 week for consideration of cycle 2.  2. Nausea: Continue Compazine as prescribed. 3. Anxiety: Monitor. 4. Leukocytosis: Resolved. 5. Anemia: Secondary to chemotherapy, monitor. 6. Hypotension: Consider IV fluids in the future if necessary.  Patient expressed understanding and was in agreement with this plan. She also understands that She can call clinic at any time with any questions, concerns, or complaints.   Breast cancer   Staging form: Breast, AJCC 7th Edition     Clinical stage from 03/26/2015: Stage IA (T1c, N0, M0) - Signed by Lloyd Huger, MD on 03/26/2015   Lloyd Huger, MD   06/08/2015 9:44 AM

## 2015-06-13 ENCOUNTER — Inpatient Hospital Stay (HOSPITAL_BASED_OUTPATIENT_CLINIC_OR_DEPARTMENT_OTHER): Payer: BLUE CROSS/BLUE SHIELD | Admitting: Oncology

## 2015-06-13 ENCOUNTER — Inpatient Hospital Stay: Payer: BLUE CROSS/BLUE SHIELD

## 2015-06-13 VITALS — BP 101/67 | HR 97 | Temp 98.3°F | Resp 17 | Ht 64.0 in | Wt 140.0 lb

## 2015-06-13 DIAGNOSIS — D630 Anemia in neoplastic disease: Secondary | ICD-10-CM

## 2015-06-13 DIAGNOSIS — C50911 Malignant neoplasm of unspecified site of right female breast: Secondary | ICD-10-CM

## 2015-06-13 DIAGNOSIS — I959 Hypotension, unspecified: Secondary | ICD-10-CM | POA: Diagnosis not present

## 2015-06-13 DIAGNOSIS — C50511 Malignant neoplasm of lower-outer quadrant of right female breast: Secondary | ICD-10-CM

## 2015-06-13 DIAGNOSIS — D6481 Anemia due to antineoplastic chemotherapy: Secondary | ICD-10-CM | POA: Diagnosis not present

## 2015-06-13 DIAGNOSIS — R110 Nausea: Secondary | ICD-10-CM

## 2015-06-13 DIAGNOSIS — Z171 Estrogen receptor negative status [ER-]: Secondary | ICD-10-CM

## 2015-06-13 DIAGNOSIS — Z79899 Other long term (current) drug therapy: Secondary | ICD-10-CM

## 2015-06-13 LAB — CBC WITH DIFFERENTIAL/PLATELET
Basophils Absolute: 0 10*3/uL (ref 0–0.1)
Basophils Relative: 1 %
EOS PCT: 11 %
Eosinophils Absolute: 0.3 10*3/uL (ref 0–0.7)
HCT: 21.2 % — ABNORMAL LOW (ref 35.0–47.0)
Hemoglobin: 7.1 g/dL — ABNORMAL LOW (ref 12.0–16.0)
LYMPHS ABS: 0.2 10*3/uL — AB (ref 1.0–3.6)
Lymphocytes Relative: 6 %
MCH: 32.5 pg (ref 26.0–34.0)
MCHC: 33.6 g/dL (ref 32.0–36.0)
MCV: 96.8 fL (ref 80.0–100.0)
MONO ABS: 0.5 10*3/uL (ref 0.2–0.9)
Monocytes Relative: 14 %
Neutro Abs: 2.2 10*3/uL (ref 1.4–6.5)
Neutrophils Relative %: 68 %
Platelets: 124 10*3/uL — ABNORMAL LOW (ref 150–440)
RBC: 2.19 MIL/uL — AB (ref 3.80–5.20)
RDW: 18.3 % — ABNORMAL HIGH (ref 11.5–14.5)
WBC: 3.2 10*3/uL — ABNORMAL LOW (ref 3.6–11.0)

## 2015-06-13 LAB — COMPREHENSIVE METABOLIC PANEL
ALT: 24 U/L (ref 14–54)
AST: 42 U/L — ABNORMAL HIGH (ref 15–41)
Albumin: 3.2 g/dL — ABNORMAL LOW (ref 3.5–5.0)
Alkaline Phosphatase: 68 U/L (ref 38–126)
Anion gap: 7 (ref 5–15)
BUN: 9 mg/dL (ref 6–20)
CO2: 25 mmol/L (ref 22–32)
CREATININE: 0.63 mg/dL (ref 0.44–1.00)
Calcium: 8.1 mg/dL — ABNORMAL LOW (ref 8.9–10.3)
Chloride: 102 mmol/L (ref 101–111)
GFR calc Af Amer: >60 mL/min (ref >60)
GFR calc non Af Amer: >60 mL/min (ref >60)
GLUCOSE: 126 mg/dL — AB (ref 65–99)
POTASSIUM: 3.5 mmol/L (ref 3.5–5.1)
Sodium: 134 mmol/L — ABNORMAL LOW (ref 135–145)
Total Bilirubin: 0.6 mg/dL (ref 0.3–1.2)
Total Protein: 6.5 g/dL (ref 6.5–8.1)

## 2015-06-13 LAB — PREPARE RBC (CROSSMATCH)

## 2015-06-13 MED ORDER — DIPHENHYDRAMINE HCL 50 MG/ML IJ SOLN
25.0000 mg | Freq: Once | INTRAMUSCULAR | Status: AC
  Administered 2015-06-13: 25 mg via INTRAVENOUS
  Filled 2015-06-13: qty 1

## 2015-06-13 MED ORDER — SODIUM CHLORIDE 0.9 % IV SOLN
Freq: Once | INTRAVENOUS | Status: AC
  Administered 2015-06-13: 16:00:00 via INTRAVENOUS
  Filled 2015-06-13: qty 4

## 2015-06-13 MED ORDER — SODIUM CHLORIDE 0.9 % IJ SOLN
10.0000 mL | INTRAMUSCULAR | Status: DC | PRN
  Administered 2015-06-13: 10 mL
  Filled 2015-06-13: qty 10

## 2015-06-13 MED ORDER — SODIUM CHLORIDE 0.9 % IV SOLN
Freq: Once | INTRAVENOUS | Status: AC
  Administered 2015-06-13: 14:00:00 via INTRAVENOUS
  Filled 2015-06-13: qty 1000

## 2015-06-13 MED ORDER — HEPARIN SOD (PORK) LOCK FLUSH 100 UNIT/ML IV SOLN: 500.0000 [IU] | Freq: Once | INTRAVENOUS | Status: AC | PRN

## 2015-06-13 MED ORDER — PACLITAXEL CHEMO INJECTION 300 MG/50ML
80.0000 mg/m2 | Freq: Once | INTRAVENOUS | Status: AC
  Administered 2015-06-13: 138 mg via INTRAVENOUS
  Filled 2015-06-13: qty 23

## 2015-06-13 MED ORDER — HEPARIN SOD (PORK) LOCK FLUSH 100 UNIT/ML IV SOLN
INTRAVENOUS | Status: AC
  Filled 2015-06-13: qty 5

## 2015-06-13 MED ORDER — FAMOTIDINE IN NACL 20-0.9 MG/50ML-% IV SOLN
20.0000 mg | Freq: Once | INTRAVENOUS | Status: AC
  Administered 2015-06-13: 20 mg via INTRAVENOUS
  Filled 2015-06-13: qty 50

## 2015-06-15 ENCOUNTER — Inpatient Hospital Stay: Payer: BLUE CROSS/BLUE SHIELD

## 2015-06-15 VITALS — BP 88/55 | HR 76 | Temp 98.6°F | Resp 20

## 2015-06-15 DIAGNOSIS — D630 Anemia in neoplastic disease: Secondary | ICD-10-CM

## 2015-06-15 DIAGNOSIS — C50511 Malignant neoplasm of lower-outer quadrant of right female breast: Secondary | ICD-10-CM | POA: Diagnosis not present

## 2015-06-15 MED ORDER — DIPHENHYDRAMINE HCL 50 MG/ML IJ SOLN
25.0000 mg | Freq: Once | INTRAMUSCULAR | Status: AC
  Administered 2015-06-15: 25 mg via INTRAVENOUS
  Filled 2015-06-15: qty 1

## 2015-06-15 MED ORDER — HEPARIN SOD (PORK) LOCK FLUSH 100 UNIT/ML IV SOLN
500.0000 [IU] | Freq: Every day | INTRAVENOUS | Status: AC | PRN
  Administered 2015-06-15: 500 [IU]
  Filled 2015-06-15: qty 5

## 2015-06-15 MED ORDER — SODIUM CHLORIDE 0.9 % IJ SOLN
10.0000 mL | INTRAMUSCULAR | Status: AC | PRN
  Administered 2015-06-15: 10 mL
  Filled 2015-06-15: qty 10

## 2015-06-15 MED ORDER — ACETAMINOPHEN 325 MG PO TABS
650.0000 mg | ORAL_TABLET | Freq: Once | ORAL | Status: AC
  Administered 2015-06-15: 650 mg via ORAL
  Filled 2015-06-15: qty 2

## 2015-06-15 MED ORDER — SODIUM CHLORIDE 0.9 % IV SOLN
250.0000 mL | Freq: Once | INTRAVENOUS | Status: AC
  Administered 2015-06-15: 250 mL via INTRAVENOUS
  Filled 2015-06-15: qty 250

## 2015-06-17 LAB — TYPE AND SCREEN
ABO/RH(D): O POS
Antibody Screen: NEGATIVE
Unit division: 0

## 2015-06-19 ENCOUNTER — Telehealth: Payer: Self-pay | Admitting: *Deleted

## 2015-06-19 MED ORDER — LIDOCAINE-PRILOCAINE 2.5-2.5 % EX CREA: TOPICAL_CREAM | CUTANEOUS | Status: DC

## 2015-06-19 NOTE — Telephone Encounter (Signed)
Escribed

## 2015-06-20 ENCOUNTER — Inpatient Hospital Stay (HOSPITAL_BASED_OUTPATIENT_CLINIC_OR_DEPARTMENT_OTHER): Payer: BLUE CROSS/BLUE SHIELD | Admitting: Oncology

## 2015-06-20 ENCOUNTER — Inpatient Hospital Stay: Payer: BLUE CROSS/BLUE SHIELD

## 2015-06-20 VITALS — BP 94/64 | HR 109 | Temp 98.3°F | Resp 16 | Wt 140.2 lb

## 2015-06-20 DIAGNOSIS — I959 Hypotension, unspecified: Secondary | ICD-10-CM

## 2015-06-20 DIAGNOSIS — D701 Agranulocytosis secondary to cancer chemotherapy: Secondary | ICD-10-CM

## 2015-06-20 DIAGNOSIS — C50911 Malignant neoplasm of unspecified site of right female breast: Secondary | ICD-10-CM

## 2015-06-20 DIAGNOSIS — Z171 Estrogen receptor negative status [ER-]: Secondary | ICD-10-CM | POA: Diagnosis not present

## 2015-06-20 DIAGNOSIS — C50511 Malignant neoplasm of lower-outer quadrant of right female breast: Secondary | ICD-10-CM

## 2015-06-20 DIAGNOSIS — Z79899 Other long term (current) drug therapy: Secondary | ICD-10-CM

## 2015-06-20 DIAGNOSIS — D6481 Anemia due to antineoplastic chemotherapy: Secondary | ICD-10-CM | POA: Diagnosis not present

## 2015-06-20 DIAGNOSIS — R110 Nausea: Secondary | ICD-10-CM

## 2015-06-20 DIAGNOSIS — C801 Malignant (primary) neoplasm, unspecified: Secondary | ICD-10-CM

## 2015-06-20 LAB — CBC WITH DIFFERENTIAL/PLATELET
BASOS ABS: 0 10*3/uL (ref 0–0.1)
BASOS PCT: 1 %
Eosinophils Absolute: 0.3 10*3/uL (ref 0–0.7)
Eosinophils Relative: 15 %
HCT: 23.4 % — ABNORMAL LOW (ref 35.0–47.0)
HEMOGLOBIN: 7.8 g/dL — AB (ref 12.0–16.0)
Lymphocytes Relative: 10 %
Lymphs Abs: 0.2 10*3/uL — ABNORMAL LOW (ref 1.0–3.6)
MCH: 32.5 pg (ref 26.0–34.0)
MCHC: 33.4 g/dL (ref 32.0–36.0)
MCV: 97.2 fL (ref 80.0–100.0)
MONO ABS: 0.2 10*3/uL (ref 0.2–0.9)
Monocytes Relative: 13 %
NEUTROS ABS: 1.1 10*3/uL — AB (ref 1.4–6.5)
Neutrophils Relative %: 61 %
Platelets: 97 10*3/uL — ABNORMAL LOW (ref 150–440)
RBC: 2.41 MIL/uL — ABNORMAL LOW (ref 3.80–5.20)
RDW: 18.6 % — ABNORMAL HIGH (ref 11.5–14.5)
WBC: 1.8 10*3/uL — ABNORMAL LOW (ref 3.6–11.0)

## 2015-06-20 LAB — BASIC METABOLIC PANEL
Anion gap: 5 (ref 5–15)
BUN: 9 mg/dL (ref 6–20)
CO2: 25 mmol/L (ref 22–32)
Calcium: 7.9 mg/dL — ABNORMAL LOW (ref 8.9–10.3)
Chloride: 102 mmol/L (ref 101–111)
Creatinine, Ser: 0.46 mg/dL (ref 0.44–1.00)
Glucose, Bld: 138 mg/dL — ABNORMAL HIGH (ref 65–99)
POTASSIUM: 3.5 mmol/L (ref 3.5–5.1)
Sodium: 132 mmol/L — ABNORMAL LOW (ref 135–145)

## 2015-06-20 MED ORDER — SODIUM CHLORIDE 0.9 % IJ SOLN
10.0000 mL | INTRAMUSCULAR | Status: DC | PRN
Start: 2015-06-20 — End: 2015-06-20
  Administered 2015-06-20: 10 mL via INTRAVENOUS
  Filled 2015-06-20: qty 10

## 2015-06-20 MED ORDER — HEPARIN SOD (PORK) LOCK FLUSH 100 UNIT/ML IV SOLN
500.0000 [IU] | Freq: Once | INTRAVENOUS | Status: AC
  Administered 2015-06-20: 500 [IU] via INTRAVENOUS

## 2015-06-20 MED ORDER — HEPARIN SOD (PORK) LOCK FLUSH 100 UNIT/ML IV SOLN
INTRAVENOUS | Status: AC
  Filled 2015-06-20: qty 5

## 2015-06-22 NOTE — Progress Notes (Signed)
[facility]  [phone] Fax:(336) [phone]  ID: Wanda Lopez OB: [birth year]  MR#: [id number]  CZY#:[id number]  Patient Care Team: Maceo Pro, MD as PCP - General (Obstetrics and Gynecology) Gae Dry, MD as Referring Physician (Obstetrics and Gynecology) Robert Bellow, MD (General Surgery)  CHIEF COMPLAINT:  Chief Complaint  Patient presents with  . Follow-up    breast cancer    INTERVAL HISTORY: Patient returns to clinic today for further evaluation and consideration of cycle 2 of 12 of weekly Taxol. She tolerated her first treatment well without significant side effects. She does complain of a poor appetite and some weight loss. She does not complain of weakness or fatigue today. She does not complain of dizziness today. She continues to be highly anxious. She has no neurologic complaints. She denies any recent fevers. She denies any chest pain or shortness of breath. She denies any vomiting, constipation, or diarrhea. She has no urinary complaints. Patient offers no further specific complaints.  REVIEW OF SYSTEMS:   Review of Systems  Constitutional: Negative for fever and malaise/fatigue.  Neurological: Negative for weakness.  Psychiatric/Behavioral: The patient is nervous/anxious.     As per HPI. Otherwise, a complete review of systems is negatve.  PAST MEDICAL HISTORY: Past Medical History  Diagnosis Date  . 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 Wide excision, sentinel node biopsy.  . Breast cancer   . Dehydration 05/28/2015    PAST SURGICAL HISTORY: Past Surgical History  Procedure Laterality Date  . Colonoscopy  2013    Dr Vira Agar  . Breast biopsy Right 02-09-15    wide excision/sentinel node biopsy.  . Portacath placement  03-29-15    Dr Bary Castilla    FAMILY HISTORY Family History  Problem Relation Age of Onset  . Hypertension Mother     . Hypertension Father   . Diabetes Father   . Heart failure Mother        ADVANCED DIRECTIVES:    HEALTH MAINTENANCE: History  Substance Use Topics  . Smoking status: Never Smoker   . Smokeless tobacco: Never Used  . Alcohol Use: No     Colonoscopy:  PAP:  Bone density:  Lipid panel:  No Known Allergies  Current Outpatient Prescriptions  Medication Sig Dispense Refill  . acetaminophen (TYLENOL) 325 MG tablet Take 650 mg by mouth every 6 (six) hours as needed.    . Diphenhyd-Hydrocort-Nystatin (FIRST-DUKES MOUTHWASH) SUSP Use as directed 5 mLs in the mouth or throat 3 (three) times daily as needed. 237 mL 1  . DiphenhydrAMINE HCl, Sleep, (UNISOM SLEEPGELS PO) Take by mouth as needed.    . loratadine (CLARITIN) 10 MG tablet Take 10 mg by mouth daily as needed for allergies.    Marland Kitchen omeprazole (PRILOSEC) 20 MG capsule Take 1 capsule (20 mg total) by mouth daily. May increase to twice a day if once daily does not help after 3 days (Patient taking differently: Take 20 mg by mouth as needed. May increase to twice a day if once daily does not help after 3 days) 45 capsule 2  . prochlorperazine (COMPAZINE) 10 MG tablet Take 1 tablet by mouth 3 (three) times daily as needed.  0  . lidocaine-prilocaine (EMLA) cream Apply to port and cover with saran wrap 1-2 hours before chemotherapy 30 g 2  . megestrol (MEGACE) 40 MG tablet Take 1 tablet (40 mg total) by mouth daily. 30 tablet  1   No current facility-administered medications for this visit.   Facility-Administered Medications Ordered in Other Visits  Medication Dose Route Frequency Provider Last Rate Last Dose  . sodium chloride 0.9 % injection 10 mL  10 mL Intracatheter PRN Lloyd Huger, MD   10 mL at 06/13/15 1403    OBJECTIVE: Filed Vitals:   06/06/15 1441  BP: 92/60  Pulse: 86  Temp: 98.5 F (36.9 C)  Resp: 16     Body mass index is 23.72 kg/(m^2).    ECOG FS:0 - Asymptomatic  General: Well-developed,  well-nourished, no acute distress. Eyes: Anicteric sclera. Breasts: Exam deferred today. Lungs: Clear to auscultation bilaterally. Heart: Regular rate and rhythm. No rubs, murmurs, or gallops. Abdomen: Soft, nontender, nondistended. No organomegaly noted, normoactive bowel sounds. Musculoskeletal: No edema, cyanosis, or clubbing. Neuro: Alert, answering all questions appropriately. Cranial nerves grossly intact. Skin: No rashes or petechiae noted. Psych: Normal affect.   LAB RESULTS:  Lab Results  Component Value Date   NA 132* 06/20/2015   K 3.5 06/20/2015   CL 102 06/20/2015   CO2 25 06/20/2015   GLUCOSE 138* 06/20/2015   BUN 9 06/20/2015   CREATININE 0.46 06/20/2015   CALCIUM 7.9* 06/20/2015   PROT 6.5 06/13/2015   ALBUMIN 3.2* 06/13/2015   AST 42* 06/13/2015   ALT 24 06/13/2015   ALKPHOS 68 06/13/2015   BILITOT 0.6 06/13/2015   GFRNONAA >60 06/20/2015   GFRAA >60 06/20/2015    Lab Results  Component Value Date   WBC 1.8* 06/20/2015   NEUTROABS 1.1* 06/20/2015   HGB 7.8* 06/20/2015   HCT 23.4* 06/20/2015   MCV 97.2 06/20/2015   PLT 97* 06/20/2015     STUDIES: No results found.  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  PLAN:    1. Breast cancer: Given a triple negative status of her disease patient will definitely benefit from adjuvant chemotherapy. Patient has completed her MammoSite therapy and has tolerated it well. Pretreatment MUGA revealed an EF of 67%.  She has also completed 4 cycles of Adriamycin and Cytoxan. Proceed with cycle 2 of 12 of weekly Taxol today. Return to clinic in 1 week for consideration of cycle 3.  2. Nausea: Continue Compazine as prescribed. 3. Anxiety: Monitor. 4. Leukocytosis: Resolved. 5. Anemia: Secondary to chemotherapy, monitor. 6. Hypotension: Consider IV fluids in the future if necessary.  Patient expressed understanding and was in agreement with this plan. She also  understands that She can call clinic at any time with any questions, concerns, or complaints.   Breast cancer   Staging form: Breast, AJCC 7th Edition     Clinical stage from 03/26/2015: Stage IA (T1c, N0, M0) - Signed by Lloyd Huger, MD on 03/26/2015   Lloyd Huger, MD   06/22/2015 4:25 PM

## 2015-06-27 ENCOUNTER — Inpatient Hospital Stay: Payer: BLUE CROSS/BLUE SHIELD

## 2015-06-27 ENCOUNTER — Inpatient Hospital Stay (HOSPITAL_BASED_OUTPATIENT_CLINIC_OR_DEPARTMENT_OTHER): Payer: BLUE CROSS/BLUE SHIELD | Admitting: Oncology

## 2015-06-27 VITALS — BP 98/67 | HR 93 | Temp 97.4°F | Resp 16 | Wt 141.8 lb

## 2015-06-27 DIAGNOSIS — D6481 Anemia due to antineoplastic chemotherapy: Secondary | ICD-10-CM

## 2015-06-27 DIAGNOSIS — Z79899 Other long term (current) drug therapy: Secondary | ICD-10-CM

## 2015-06-27 DIAGNOSIS — C50511 Malignant neoplasm of lower-outer quadrant of right female breast: Secondary | ICD-10-CM | POA: Diagnosis not present

## 2015-06-27 DIAGNOSIS — R110 Nausea: Secondary | ICD-10-CM

## 2015-06-27 DIAGNOSIS — I959 Hypotension, unspecified: Secondary | ICD-10-CM

## 2015-06-27 DIAGNOSIS — Z701 Counseling related to patient's sexual behavior and orientation: Secondary | ICD-10-CM | POA: Diagnosis not present

## 2015-06-27 DIAGNOSIS — C50911 Malignant neoplasm of unspecified site of right female breast: Secondary | ICD-10-CM

## 2015-06-27 DIAGNOSIS — Z171 Estrogen receptor negative status [ER-]: Secondary | ICD-10-CM | POA: Diagnosis not present

## 2015-06-27 LAB — CBC WITH DIFFERENTIAL/PLATELET
BASOS PCT: 2 %
Basophils Absolute: 0 10*3/uL (ref 0–0.1)
EOS ABS: 0.3 10*3/uL (ref 0–0.7)
Eosinophils Relative: 11 %
HEMATOCRIT: 26.8 % — AB (ref 35.0–47.0)
HEMOGLOBIN: 8.8 g/dL — AB (ref 12.0–16.0)
Lymphocytes Relative: 12 %
Lymphs Abs: 0.3 10*3/uL — ABNORMAL LOW (ref 1.0–3.6)
MCH: 33.1 pg (ref 26.0–34.0)
MCHC: 32.9 g/dL (ref 32.0–36.0)
MCV: 100.8 fL — AB (ref 80.0–100.0)
MONO ABS: 0.5 10*3/uL (ref 0.2–0.9)
MONOS PCT: 21 %
NEUTROS PCT: 56 %
Neutro Abs: 1.5 10*3/uL (ref 1.4–6.5)
Platelets: 130 10*3/uL — ABNORMAL LOW (ref 150–440)
RBC: 2.66 MIL/uL — ABNORMAL LOW (ref 3.80–5.20)
RDW: 21.1 % — AB (ref 11.5–14.5)
WBC: 2.7 10*3/uL — AB (ref 3.6–11.0)

## 2015-06-27 LAB — BASIC METABOLIC PANEL
ANION GAP: 5 (ref 5–15)
BUN: 9 mg/dL (ref 6–20)
CO2: 27 mmol/L (ref 22–32)
Calcium: 8.5 mg/dL — ABNORMAL LOW (ref 8.9–10.3)
Chloride: 103 mmol/L (ref 101–111)
Creatinine, Ser: 0.54 mg/dL (ref 0.44–1.00)
GFR calc Af Amer: >60 mL/min (ref >60)
GFR calc non Af Amer: >60 mL/min (ref >60)
Glucose, Bld: 135 mg/dL — ABNORMAL HIGH (ref 65–99)
Potassium: 3.8 mmol/L (ref 3.5–5.1)
SODIUM: 135 mmol/L (ref 135–145)

## 2015-06-27 MED ORDER — FAMOTIDINE IN NACL 20-0.9 MG/50ML-% IV SOLN
20.0000 mg | Freq: Once | INTRAVENOUS | Status: AC
  Administered 2015-06-27: 20 mg via INTRAVENOUS
  Filled 2015-06-27: qty 50

## 2015-06-27 MED ORDER — SODIUM CHLORIDE 0.9 % IV SOLN
Freq: Once | INTRAVENOUS | Status: AC
  Administered 2015-06-27: 15:00:00 via INTRAVENOUS
  Filled 2015-06-27: qty 1000

## 2015-06-27 MED ORDER — HEPARIN SOD (PORK) LOCK FLUSH 100 UNIT/ML IV SOLN
500.0000 [IU] | Freq: Once | INTRAVENOUS | Status: DC
  Filled 2015-06-27: qty 5

## 2015-06-27 MED ORDER — SODIUM CHLORIDE 0.9 % IV SOLN
Freq: Once | INTRAVENOUS | Status: AC
  Administered 2015-06-27: 15:00:00 via INTRAVENOUS
  Filled 2015-06-27: qty 4

## 2015-06-27 MED ORDER — SODIUM CHLORIDE 0.9 % IJ SOLN
10.0000 mL | INTRAMUSCULAR | Status: DC | PRN
  Administered 2015-06-27: 10 mL via INTRAVENOUS
  Filled 2015-06-27: qty 10

## 2015-06-27 MED ORDER — DIPHENHYDRAMINE HCL 50 MG/ML IJ SOLN
25.0000 mg | Freq: Once | INTRAMUSCULAR | Status: AC
  Administered 2015-06-27: 25 mg via INTRAVENOUS
  Filled 2015-06-27: qty 1

## 2015-06-27 MED ORDER — PACLITAXEL CHEMO INJECTION 300 MG/50ML
72.0000 mg/m2 | Freq: Once | INTRAVENOUS | Status: AC
  Administered 2015-06-27: 126 mg via INTRAVENOUS
  Filled 2015-06-27: qty 21

## 2015-06-27 NOTE — Progress Notes (Signed)
[place]  Telephone:(336) [phone] [phone]) [phone]  ID: Wanda Lopez OB: 10-28-1961  MR#: [id number]  OIN#:[id number]  Patient Care Team: Maceo Pro, MD as PCP - General (Obstetrics and Gynecology) Gae Dry, MD as Referring Physician (Obstetrics and Gynecology) Robert Bellow, MD (General Surgery)  CHIEF COMPLAINT:  Chief Complaint  Patient presents with  . Follow-up    breast cancer  . Chemotherapy    INTERVAL HISTORY: Patient returns to clinic today for further evaluation and consideration of cycle 4 of 12 of weekly Taxol. She does not complain of weakness or fatigue today. She does not complain of dizziness today. She continues to be highly anxious. She has no neurologic complaints. She denies any recent fevers. She denies any chest pain or shortness of breath. She denies any vomiting, constipation, or diarrhea. She has no urinary complaints. Patient offers no further specific complaints.  REVIEW OF SYSTEMS:   Review of Systems  Constitutional: Negative for fever and malaise/fatigue.  Neurological: Negative for weakness.  Psychiatric/Behavioral: The patient is nervous/anxious.     As per HPI. Otherwise, a complete review of systems is negatve.  PAST MEDICAL HISTORY: Past Medical History  Diagnosis Date  . 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 Wide excision, sentinel node biopsy.  . Breast cancer   . Dehydration 05/28/2015    PAST SURGICAL HISTORY: Past Surgical History  Procedure Laterality Date  . Colonoscopy  2013    Dr Vira Agar  . Breast biopsy Right 02-09-15    wide excision/sentinel node biopsy.  . Portacath placement  03-29-15    Dr Bary Castilla    FAMILY HISTORY Family History  Problem Relation Age of Onset  . Hypertension Mother   . Hypertension Father   . Diabetes Father   . Heart failure Mother        ADVANCED DIRECTIVES:    HEALTH  MAINTENANCE: History  Substance Use Topics  . Smoking status: Never Smoker   . Smokeless tobacco: Never Used  . Alcohol Use: No     Colonoscopy:  PAP:  Bone density:  Lipid panel:  No Known Allergies  Current Outpatient Prescriptions  Medication Sig Dispense Refill  . acetaminophen (TYLENOL) 325 MG tablet Take 650 mg by mouth every 6 (six) hours as needed.    . Diphenhyd-Hydrocort-Nystatin (FIRST-DUKES MOUTHWASH) SUSP Use as directed 5 mLs in the mouth or throat 3 (three) times daily as needed. 237 mL 1  . DiphenhydrAMINE HCl, Sleep, (UNISOM SLEEPGELS PO) Take by mouth as needed.    . lidocaine-prilocaine (EMLA) cream Apply to port and cover with saran wrap 1-2 hours before chemotherapy 30 g 2  . loratadine (CLARITIN) 10 MG tablet Take 10 mg by mouth daily as needed for allergies.    . megestrol (MEGACE) 40 MG tablet Take 1 tablet (40 mg total) by mouth daily. 30 tablet 1  . omeprazole (PRILOSEC) 20 MG capsule Take 1 capsule (20 mg total) by mouth daily. May increase to twice a day if once daily does not help after 3 days (Patient taking differently: Take 20 mg by mouth as needed. May increase to twice a day if once daily does not help after 3 days) 45 capsule 2  . prochlorperazine (COMPAZINE) 10 MG tablet Take 1 tablet by mouth 3 (three) times daily as needed.  0   No current facility-administered medications for this visit.   Facility-Administered Medications Ordered in Other Visits  Medication Dose Route Frequency Provider Last Rate Last Dose  . sodium chloride 0.9 % injection 10 mL  10 mL Intracatheter PRN Lloyd Huger, MD   10 mL at 06/13/15 1403    OBJECTIVE: Filed Vitals:   06/20/15 1433  BP: 94/64  Pulse: 109  Temp: 98.3 F (36.8 C)  Resp: 16     Body mass index is 24.06 kg/(m^2).    ECOG FS:0 - Asymptomatic  General: Well-developed, well-nourished, no acute distress. Eyes: Anicteric sclera. Breasts: Exam deferred today. Lungs: Clear to auscultation  bilaterally. Heart: Regular rate and rhythm. No rubs, murmurs, or gallops. Abdomen: Soft, nontender, nondistended. No organomegaly noted, normoactive bowel sounds. Musculoskeletal: No edema, cyanosis, or clubbing. Neuro: Alert, answering all questions appropriately. Cranial nerves grossly intact. Skin: No rashes or petechiae noted. Psych: Normal affect.   LAB RESULTS:  Lab Results  Component Value Date   NA 132* 06/20/2015   K 3.5 06/20/2015   CL 102 06/20/2015   CO2 25 06/20/2015   GLUCOSE 138* 06/20/2015   BUN 9 06/20/2015   CREATININE 0.46 06/20/2015   CALCIUM 7.9* 06/20/2015   PROT 6.5 06/13/2015   ALBUMIN 3.2* 06/13/2015   AST 42* 06/13/2015   ALT 24 06/13/2015   ALKPHOS 68 06/13/2015   BILITOT 0.6 06/13/2015   GFRNONAA >60 06/20/2015   GFRAA >60 06/20/2015    Lab Results  Component Value Date   WBC 1.8* 06/20/2015   NEUTROABS 1.1* 06/20/2015   HGB 7.8* 06/20/2015   HCT 23.4* 06/20/2015   MCV 97.2 06/20/2015   PLT 97* 06/20/2015     STUDIES: No results found.  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  PLAN:    1. Breast cancer: Given a triple negative status of her disease patient will definitely benefit from adjuvant chemotherapy. Patient has completed her MammoSite therapy and has tolerated it well. Pretreatment MUGA revealed an EF of 67%.  She has also completed 4 cycles of Adriamycin and Cytoxan. Delay cycle 4 of 12 of weekly Taxol today secondary to neutropenia and thrombocytopenia. Return to clinic in 1 week for reconsideration of cycle 4.  2. Nausea: Continue Compazine as prescribed. 3. Anxiety: Monitor. 4. Leukopenia: Secondary to chemotherapy, monitor. 5. Anemia: Secondary to chemotherapy, monitor. We will consider a blood transfusion in the near future if her hemoglobin continues to drop. 6. Hypotension: Consider IV fluids in the future if necessary. 7. Thrombocytopenia: Secondary to  chemotherapy, delay treatment as above.  Patient expressed understanding and was in agreement with this plan. She also understands that She can call clinic at any time with any questions, concerns, or complaints.   Breast cancer   Staging form: Breast, AJCC 7th Edition     Clinical stage from 03/26/2015: Stage IA (T1c, N0, M0) - Signed by Lloyd Huger, MD on 03/26/2015   Lloyd Huger, MD   06/27/2015 1:55 PM

## 2015-06-27 NOTE — Progress Notes (Signed)
[place]  Telephone:(336) [phone] Fax:(336) [phone]  ID: [name] OB: [birth year]  MR#: [id number]  NOB#:[id number]  Patient Care Team: Maceo Pro, MD as PCP - General (Obstetrics and Gynecology) Gae Dry, MD as Referring Physician (Obstetrics and Gynecology) Robert Bellow, MD (General Surgery)  CHIEF COMPLAINT:  Chief Complaint  Patient presents with  . Follow-up    Breast Cancer    INTERVAL HISTORY: Patient returns to clinic today for further evaluation and consideration of cycle 3 of 12 of weekly Taxol. She does not complain of weakness or fatigue today. She does not complain of dizziness today. She continues to be highly anxious. She has no neurologic complaints. She denies any recent fevers. She denies any chest pain or shortness of breath. She denies any vomiting, constipation, or diarrhea. She has no urinary complaints. Patient offers no further specific complaints.  REVIEW OF SYSTEMS:   Review of Systems  Constitutional: Negative for fever and malaise/fatigue.  Neurological: Negative for weakness.  Psychiatric/Behavioral: The patient is nervous/anxious.     As per HPI. Otherwise, a complete review of systems is negatve.  PAST MEDICAL HISTORY: Past Medical History  Diagnosis Date  . 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 Wide excision, sentinel node biopsy.  . Breast cancer   . Dehydration 05/28/2015    PAST SURGICAL HISTORY: Past Surgical History  Procedure Laterality Date  . Colonoscopy  2013    Dr Vira Agar  . Breast biopsy Right 02-09-15    wide excision/sentinel node biopsy.  . Portacath placement  03-29-15    Dr Bary Castilla    FAMILY HISTORY Family History  Problem Relation Age of Onset  . Hypertension Mother   . Hypertension Father   . Diabetes Father   . Heart failure Mother        ADVANCED DIRECTIVES:    HEALTH  MAINTENANCE: History  Substance Use Topics  . Smoking status: Never Smoker   . Smokeless tobacco: Never Used  . Alcohol Use: No     Colonoscopy:  PAP:  Bone density:  Lipid panel:  No Known Allergies  Current Outpatient Prescriptions  Medication Sig Dispense Refill  . acetaminophen (TYLENOL) 325 MG tablet Take 650 mg by mouth every 6 (six) hours as needed.    . Diphenhyd-Hydrocort-Nystatin (FIRST-DUKES MOUTHWASH) SUSP Use as directed 5 mLs in the mouth or throat 3 (three) times daily as needed. 237 mL 1  . DiphenhydrAMINE HCl, Sleep, (UNISOM SLEEPGELS PO) Take by mouth as needed.    . loratadine (CLARITIN) 10 MG tablet Take 10 mg by mouth daily as needed for allergies.    . megestrol (MEGACE) 40 MG tablet Take 1 tablet (40 mg total) by mouth daily. 30 tablet 1  . omeprazole (PRILOSEC) 20 MG capsule Take 1 capsule (20 mg total) by mouth daily. May increase to twice a day if once daily does not help after 3 days (Patient taking differently: Take 20 mg by mouth as needed. May increase to twice a day if once daily does not help after 3 days) 45 capsule 2  . prochlorperazine (COMPAZINE) 10 MG tablet Take 1 tablet by mouth 3 (three) times daily as needed.  0  . lidocaine-prilocaine (EMLA) cream Apply to port and cover with saran wrap 1-2 hours before chemotherapy 30 g 2   No current facility-administered medications for this visit.   Facility-Administered Medications Ordered in Other Visits  Medication Dose Route  Frequency Provider Last Rate Last Dose  . sodium chloride 0.9 % injection 10 mL  10 mL Intracatheter PRN Lloyd Huger, MD   10 mL at 06/13/15 1403    OBJECTIVE: Filed Vitals:   06/13/15 1419  BP: 101/67  Pulse: 97  Temp: 98.3 F (36.8 C)  Resp: 17     Body mass index is 24.02 kg/(m^2).    ECOG FS:0 - Asymptomatic  General: Well-developed, well-nourished, no acute distress. Eyes: Anicteric sclera. Breasts: Exam deferred today. Lungs: Clear to auscultation  bilaterally. Heart: Regular rate and rhythm. No rubs, murmurs, or gallops. Abdomen: Soft, nontender, nondistended. No organomegaly noted, normoactive bowel sounds. Musculoskeletal: No edema, cyanosis, or clubbing. Neuro: Alert, answering all questions appropriately. Cranial nerves grossly intact. Skin: No rashes or petechiae noted. Psych: Normal affect.   LAB RESULTS:  Lab Results  Component Value Date   NA 132* 06/20/2015   K 3.5 06/20/2015   CL 102 06/20/2015   CO2 25 06/20/2015   GLUCOSE 138* 06/20/2015   BUN 9 06/20/2015   CREATININE 0.46 06/20/2015   CALCIUM 7.9* 06/20/2015   PROT 6.5 06/13/2015   ALBUMIN 3.2* 06/13/2015   AST 42* 06/13/2015   ALT 24 06/13/2015   ALKPHOS 68 06/13/2015   BILITOT 0.6 06/13/2015   GFRNONAA >60 06/20/2015   GFRAA >60 06/20/2015    Lab Results  Component Value Date   WBC 1.8* 06/20/2015   NEUTROABS 1.1* 06/20/2015   HGB 7.8* 06/20/2015   HCT 23.4* 06/20/2015   MCV 97.2 06/20/2015   PLT 97* 06/20/2015     STUDIES: No results found.  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  PLAN:    1. Breast cancer: Given a triple negative status of her disease patient will definitely benefit from adjuvant chemotherapy. Patient has completed her MammoSite therapy and has tolerated it well. Pretreatment MUGA revealed an EF of 67%.  She has also completed 4 cycles of Adriamycin and Cytoxan. Proceed with cycle 3 of 12 of weekly Taxol today. Return to clinic in 1 week for consideration of cycle 4.  2. Nausea: Continue Compazine as prescribed. 3. Anxiety: Monitor. 4. Leukocytosis: Resolved. 5. Anemia: Secondary to chemotherapy, monitor. 6. Hypotension: Consider IV fluids in the future if necessary.  Patient expressed understanding and was in agreement with this plan. She also understands that She can call clinic at any time with any questions, concerns, or complaints.   Breast cancer    Staging form: Breast, AJCC 7th Edition     Clinical stage from 03/26/2015: Stage IA (T1c, N0, M0) - Signed by Lloyd Huger, MD on 03/26/2015   Lloyd Huger, MD   06/27/2015 1:52 PM

## 2015-07-03 ENCOUNTER — Other Ambulatory Visit: Payer: Self-pay | Admitting: *Deleted

## 2015-07-03 DIAGNOSIS — C50911 Malignant neoplasm of unspecified site of right female breast: Secondary | ICD-10-CM

## 2015-07-03 NOTE — Progress Notes (Signed)
[place]  Telephone:(336) [phone] Fax:(336) [phone]  ID: [name] OB: January 30, 1961  MR#: [id number]  GLO#:[id number]  Patient Care Team: Maceo Pro, MD as PCP - General (Obstetrics and Gynecology) Gae Dry, MD as Referring Physician (Obstetrics and Gynecology) Robert Bellow, MD (General Surgery)  CHIEF COMPLAINT:  Chief Complaint  Patient presents with  . Follow-up    breast cancer    INTERVAL HISTORY: Patient returns to clinic today for further evaluation and reconsideration of cycle 4 of 12 of weekly Taxol. She does not complain of weakness or fatigue today. She does not complain of dizziness today. She continues to be anxious. She has no neurologic complaints. She denies any recent fevers. She denies any chest pain or shortness of breath. She denies any vomiting, constipation, or diarrhea. She has no urinary complaints. Patient offers no further specific complaints.  REVIEW OF SYSTEMS:   Review of Systems  Constitutional: Negative for fever and malaise/fatigue.  Neurological: Negative for weakness.  Endo/Heme/Allergies: Does not bruise/bleed easily.  Psychiatric/Behavioral: The patient is nervous/anxious.     As per HPI. Otherwise, a complete review of systems is negatve.  PAST MEDICAL HISTORY: Past Medical History  Diagnosis Date  . 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 Wide excision, sentinel node biopsy.  . Breast cancer   . Dehydration 05/28/2015    PAST SURGICAL HISTORY: Past Surgical History  Procedure Laterality Date  . Colonoscopy  2013    Dr Vira Agar  . Breast biopsy Right 02-09-15    wide excision/sentinel node biopsy.  . Portacath placement  03-29-15    Dr Bary Castilla    FAMILY HISTORY Family History  Problem Relation Age of Onset  . Hypertension Mother   . Hypertension Father   . Diabetes Father   . Heart failure Mother         ADVANCED DIRECTIVES:    HEALTH MAINTENANCE: History  Substance Use Topics  . Smoking status: Never Smoker   . Smokeless tobacco: Never Used  . Alcohol Use: No     Colonoscopy:  PAP:  Bone density:  Lipid panel:  No Known Allergies  Current Outpatient Prescriptions  Medication Sig Dispense Refill  . acetaminophen (TYLENOL) 325 MG tablet Take 650 mg by mouth every 6 (six) hours as needed.    . Diphenhyd-Hydrocort-Nystatin (FIRST-DUKES MOUTHWASH) SUSP Use as directed 5 mLs in the mouth or throat 3 (three) times daily as needed. 237 mL 1  . DiphenhydrAMINE HCl, Sleep, (UNISOM SLEEPGELS PO) Take by mouth as needed.    . lidocaine-prilocaine (EMLA) cream Apply to port and cover with saran wrap 1-2 hours before chemotherapy 30 g 2  . loratadine (CLARITIN) 10 MG tablet Take 10 mg by mouth daily as needed for allergies.    Marland Kitchen omeprazole (PRILOSEC) 20 MG capsule Take 1 capsule (20 mg total) by mouth daily. May increase to twice a day if once daily does not help after 3 days (Patient taking differently: Take 20 mg by mouth as needed. May increase to twice a day if once daily does not help after 3 days) 45 capsule 2  . prochlorperazine (COMPAZINE) 10 MG tablet Take 1 tablet by mouth 3 (three) times daily as needed.  0   No current facility-administered medications for this visit.   Facility-Administered Medications Ordered in Other Visits  Medication Dose Route Frequency Provider Last Rate Last Dose  . sodium chloride 0.9 % injection 10  mL  10 mL Intracatheter PRN Lloyd Huger, MD   10 mL at 06/13/15 1403    OBJECTIVE: Filed Vitals:   06/27/15 1427  BP: 98/67  Pulse: 93  Temp: 97.4 F (36.3 C)  Resp: 16     Body mass index is 24.32 kg/(m^2).    ECOG FS:0 - Asymptomatic  General: Well-developed, well-nourished, no acute distress. Eyes: Anicteric sclera. Breasts: Exam deferred today. Lungs: Clear to auscultation bilaterally. Heart: Regular rate and rhythm. No rubs,  murmurs, or gallops. Abdomen: Soft, nontender, nondistended. No organomegaly noted, normoactive bowel sounds. Musculoskeletal: No edema, cyanosis, or clubbing. Neuro: Alert, answering all questions appropriately. Cranial nerves grossly intact. Skin: No rashes or petechiae noted. Psych: Normal affect.   LAB RESULTS:  Lab Results  Component Value Date   NA 135 06/27/2015   K 3.8 06/27/2015   CL 103 06/27/2015   CO2 27 06/27/2015   GLUCOSE 135* 06/27/2015   BUN 9 06/27/2015   CREATININE 0.54 06/27/2015   CALCIUM 8.5* 06/27/2015   PROT 6.5 06/13/2015   ALBUMIN 3.2* 06/13/2015   AST 42* 06/13/2015   ALT 24 06/13/2015   ALKPHOS 68 06/13/2015   BILITOT 0.6 06/13/2015   GFRNONAA >60 06/27/2015   GFRAA >60 06/27/2015    Lab Results  Component Value Date   WBC 2.7* 06/27/2015   NEUTROABS 1.5 06/27/2015   HGB 8.8* 06/27/2015   HCT 26.8* 06/27/2015   MCV 100.8* 06/27/2015   PLT 130* 06/27/2015     STUDIES: No results found.  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  PLAN:    1. Breast cancer: Given a triple negative status of her disease patient will definitely benefit from adjuvant chemotherapy. Patient has completed her MammoSite therapy and has tolerated it well. Pretreatment MUGA revealed an EF of 67%.  She has also completed 4 cycles of Adriamycin and Cytoxan. Proceed with cycle 4 of 12 of weekly Taxol today. Will dose reduce Taxol 10%.  Return to clinic in 1 week for consideration of cycle 5.  2. Nausea: Continue Compazine as prescribed. 3. Anxiety: Monitor. 4. Leukopenia: Improved. Secondary to chemotherapy, monitor.  Dose reduce taxol as above. 5. Anemia: Secondary to chemotherapy, monitor. We will consider a blood transfusion in the near future if her hemoglobin continues to drop. 6. Hypotension: Consider IV fluids in the future if necessary. 7. Thrombocytopenia: Secondary to chemotherapy, dose reduction as  above.  Patient expressed understanding and was in agreement with this plan. She also understands that She can call clinic at any time with any questions, concerns, or complaints.   Breast cancer   Staging form: Breast, AJCC 7th Edition     Clinical stage from 03/26/2015: Stage IA (T1c, N0, M0) - Signed by Lloyd Huger, MD on 03/26/2015   Lloyd Huger, MD   07/03/2015 11:39 PM

## 2015-07-04 ENCOUNTER — Inpatient Hospital Stay: Payer: BLUE CROSS/BLUE SHIELD

## 2015-07-04 ENCOUNTER — Inpatient Hospital Stay: Payer: BLUE CROSS/BLUE SHIELD | Attending: Oncology

## 2015-07-04 ENCOUNTER — Inpatient Hospital Stay (HOSPITAL_BASED_OUTPATIENT_CLINIC_OR_DEPARTMENT_OTHER): Payer: BLUE CROSS/BLUE SHIELD | Admitting: Oncology

## 2015-07-04 ENCOUNTER — Ambulatory Visit: Payer: BLUE CROSS/BLUE SHIELD

## 2015-07-04 VITALS — BP 103/68 | HR 109 | Temp 98.5°F | Resp 18 | Wt 142.4 lb

## 2015-07-04 DIAGNOSIS — R110 Nausea: Secondary | ICD-10-CM | POA: Diagnosis not present

## 2015-07-04 DIAGNOSIS — Z5111 Encounter for antineoplastic chemotherapy: Secondary | ICD-10-CM | POA: Insufficient documentation

## 2015-07-04 DIAGNOSIS — C50511 Malignant neoplasm of lower-outer quadrant of right female breast: Secondary | ICD-10-CM

## 2015-07-04 DIAGNOSIS — I959 Hypotension, unspecified: Secondary | ICD-10-CM

## 2015-07-04 DIAGNOSIS — F419 Anxiety disorder, unspecified: Secondary | ICD-10-CM | POA: Insufficient documentation

## 2015-07-04 DIAGNOSIS — Z171 Estrogen receptor negative status [ER-]: Secondary | ICD-10-CM

## 2015-07-04 DIAGNOSIS — Z79899 Other long term (current) drug therapy: Secondary | ICD-10-CM | POA: Insufficient documentation

## 2015-07-04 DIAGNOSIS — D701 Agranulocytosis secondary to cancer chemotherapy: Secondary | ICD-10-CM | POA: Insufficient documentation

## 2015-07-04 DIAGNOSIS — D6481 Anemia due to antineoplastic chemotherapy: Secondary | ICD-10-CM | POA: Diagnosis not present

## 2015-07-04 DIAGNOSIS — D72819 Decreased white blood cell count, unspecified: Secondary | ICD-10-CM | POA: Insufficient documentation

## 2015-07-04 DIAGNOSIS — C50911 Malignant neoplasm of unspecified site of right female breast: Secondary | ICD-10-CM

## 2015-07-04 LAB — BASIC METABOLIC PANEL
Anion gap: 6 (ref 5–15)
BUN: 9 mg/dL (ref 6–20)
CO2: 27 mmol/L (ref 22–32)
Calcium: 8.4 mg/dL — ABNORMAL LOW (ref 8.9–10.3)
Chloride: 102 mmol/L (ref 101–111)
Creatinine, Ser: 0.47 mg/dL (ref 0.44–1.00)
GFR calc non Af Amer: >60 mL/min (ref >60)
GLUCOSE: 129 mg/dL — AB (ref 65–99)
Potassium: 3.6 mmol/L (ref 3.5–5.1)
Sodium: 135 mmol/L (ref 135–145)

## 2015-07-04 LAB — CBC WITH DIFFERENTIAL/PLATELET
BASOS ABS: 0 10*3/uL (ref 0–0.1)
Basophils Relative: 1 %
EOS PCT: 16 %
Eosinophils Absolute: 0.5 10*3/uL (ref 0–0.7)
HCT: 26.2 % — ABNORMAL LOW (ref 35.0–47.0)
Hemoglobin: 8.8 g/dL — ABNORMAL LOW (ref 12.0–16.0)
Lymphocytes Relative: 11 %
Lymphs Abs: 0.3 10*3/uL — ABNORMAL LOW (ref 1.0–3.6)
MCH: 33.7 pg (ref 26.0–34.0)
MCHC: 33.5 g/dL (ref 32.0–36.0)
MCV: 100.8 fL — ABNORMAL HIGH (ref 80.0–100.0)
MONO ABS: 0.2 10*3/uL (ref 0.2–0.9)
Monocytes Relative: 7 %
NEUTROS ABS: 2 10*3/uL (ref 1.4–6.5)
Neutrophils Relative %: 65 %
Platelets: 115 10*3/uL — ABNORMAL LOW (ref 150–440)
RBC: 2.6 MIL/uL — ABNORMAL LOW (ref 3.80–5.20)
RDW: 21.6 % — ABNORMAL HIGH (ref 11.5–14.5)
WBC: 3.1 10*3/uL — AB (ref 3.6–11.0)

## 2015-07-04 MED ORDER — FAMOTIDINE IN NACL 20-0.9 MG/50ML-% IV SOLN
20.0000 mg | Freq: Once | INTRAVENOUS | Status: AC
  Administered 2015-07-04: 20 mg via INTRAVENOUS
  Filled 2015-07-04: qty 50

## 2015-07-04 MED ORDER — SODIUM CHLORIDE 0.9 % IV SOLN
Freq: Once | INTRAVENOUS | Status: AC
  Administered 2015-07-04: 15:00:00 via INTRAVENOUS
  Filled 2015-07-04: qty 4

## 2015-07-04 MED ORDER — PACLITAXEL CHEMO INJECTION 300 MG/50ML
72.0000 mg/m2 | Freq: Once | INTRAVENOUS | Status: AC
  Administered 2015-07-04: 126 mg via INTRAVENOUS
  Filled 2015-07-04: qty 21

## 2015-07-04 MED ORDER — SODIUM CHLORIDE 0.9 % IJ SOLN
10.0000 mL | INTRAMUSCULAR | Status: DC | PRN
  Filled 2015-07-04: qty 10

## 2015-07-04 MED ORDER — HEPARIN SOD (PORK) LOCK FLUSH 100 UNIT/ML IV SOLN
500.0000 [IU] | Freq: Once | INTRAVENOUS | Status: AC
  Administered 2015-07-04: 500 [IU] via INTRAVENOUS

## 2015-07-04 MED ORDER — HEPARIN SOD (PORK) LOCK FLUSH 100 UNIT/ML IV SOLN
500.0000 [IU] | Freq: Once | INTRAVENOUS | Status: DC | PRN
  Filled 2015-07-04: qty 5

## 2015-07-04 MED ORDER — DIPHENHYDRAMINE HCL 50 MG/ML IJ SOLN
25.0000 mg | Freq: Once | INTRAMUSCULAR | Status: AC
  Administered 2015-07-04: 25 mg via INTRAVENOUS
  Filled 2015-07-04: qty 1

## 2015-07-04 MED ORDER — SODIUM CHLORIDE 0.9 % IV SOLN
Freq: Once | INTRAVENOUS | Status: AC
  Administered 2015-07-04: 15:00:00 via INTRAVENOUS
  Filled 2015-07-04: qty 1000

## 2015-07-04 NOTE — Progress Notes (Signed)
[place]  [phone] [phone]) [phone]  ID: [name] OB: 25-May-1961  MR#: [id number]  OMV#:[id number]  Patient Care Team: Maceo Pro, MD as PCP - General (Obstetrics and Gynecology) Gae Dry, MD as Referring Physician (Obstetrics and Gynecology) Robert Bellow, MD (General Surgery)  CHIEF COMPLAINT:  Chief Complaint  Patient presents with  . Follow-up    breast cancer  . Chemotherapy    INTERVAL HISTORY: Patient returns to clinic today for further evaluation and consideration of cycle 4 of 12 of weekly Taxol. She does not complain of weakness or fatigue today. She does not complain of dizziness today. She continues to be anxious. She has no neurologic complaints. She denies any recent fevers. She denies any chest pain or shortness of breath. She denies any vomiting, constipation, or diarrhea. She has no urinary complaints. Patient offers no further specific complaints.  REVIEW OF SYSTEMS:   Review of Systems  Constitutional: Negative for fever and malaise/fatigue.  Neurological: Negative for weakness.  Endo/Heme/Allergies: Does not bruise/bleed easily.  Psychiatric/Behavioral: The patient is nervous/anxious.     As per HPI. Otherwise, a complete review of systems is negatve.  PAST MEDICAL HISTORY: Past Medical History  Diagnosis Date  . 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 Wide excision, sentinel node biopsy.  . Breast cancer   . Dehydration 05/28/2015    PAST SURGICAL HISTORY: Past Surgical History  Procedure Laterality Date  . Colonoscopy  2013    Dr Vira Agar  . Breast biopsy Right 02-09-15    wide excision/sentinel node biopsy.  . Portacath placement  03-29-15    Dr Bary Castilla    FAMILY HISTORY Family History  Problem Relation Age of Onset  . Hypertension Mother   . Hypertension Father   . Diabetes Father   . Heart failure Mother         ADVANCED DIRECTIVES:    HEALTH MAINTENANCE: History  Substance Use Topics  . Smoking status: Never Smoker   . Smokeless tobacco: Never Used  . Alcohol Use: No     Colonoscopy:  PAP:  Bone density:  Lipid panel:  No Known Allergies  Current Outpatient Prescriptions  Medication Sig Dispense Refill  . acetaminophen (TYLENOL) 325 MG tablet Take 650 mg by mouth every 6 (six) hours as needed.    . Diphenhyd-Hydrocort-Nystatin (FIRST-DUKES MOUTHWASH) SUSP Use as directed 5 mLs in the mouth or throat 3 (three) times daily as needed. 237 mL 1  . DiphenhydrAMINE HCl, Sleep, (UNISOM SLEEPGELS PO) Take by mouth as needed.    . lidocaine-prilocaine (EMLA) cream Apply to port and cover with saran wrap 1-2 hours before chemotherapy 30 g 2  . loratadine (CLARITIN) 10 MG tablet Take 10 mg by mouth daily as needed for allergies.    Marland Kitchen prochlorperazine (COMPAZINE) 10 MG tablet Take 1 tablet by mouth 3 (three) times daily as needed.  0  . omeprazole (PRILOSEC) 20 MG capsule Take 1 capsule (20 mg total) by mouth daily. May increase to twice a day if once daily does not help after 3 days (Patient not taking: Reported on 07/04/2015) 45 capsule 2   No current facility-administered medications for this visit.   Facility-Administered Medications Ordered in Other Visits  Medication Dose Route Frequency Provider Last Rate Last Dose  . 0.9 %  sodium chloride infusion   Intravenous Once Lloyd Huger, MD      . diphenhydrAMINE (BENADRYL)  injection 25 mg  25 mg Intravenous Once Lloyd Huger, MD      . famotidine (PEPCID) IVPB 20 mg premix  20 mg Intravenous Once Lloyd Huger, MD      . heparin lock flush 100 unit/mL  500 Units Intravenous Once Lloyd Huger, MD      . heparin lock flush 100 unit/mL  500 Units Intracatheter Once PRN Lloyd Huger, MD      . ondansetron (ZOFRAN) 8 mg, dexamethasone (DECADRON) 10 mg in sodium chloride 0.9 % 50 mL IVPB   Intravenous Once Lloyd Huger, MD      . PACLitaxel (TAXOL) 126 mg in dextrose 5 % 250 mL chemo infusion (</= 72m/m2)  72 mg/m2 (Treatment Plan Actual) Intravenous Once TLloyd Huger MD      . sodium chloride 0.9 % injection 10 mL  10 mL Intracatheter PRN TLloyd Huger MD   10 mL at 06/13/15 1403  . sodium chloride 0.9 % injection 10 mL  10 mL Intravenous PRN TLloyd Huger MD      . sodium chloride 0.9 % injection 10 mL  10 mL Intracatheter PRN TLloyd Huger MD        OBJECTIVE: Filed Vitals:   07/04/15 1427  BP: 103/68  Pulse: 109  Temp: 98.5 F (36.9 C)  Resp: 18     Body mass index is 24.43 kg/(m^2).    ECOG FS:0 - Asymptomatic  General: Well-developed, well-nourished, no acute distress. Eyes: Anicteric sclera. Breasts: Exam deferred today. Lungs: Clear to auscultation bilaterally. Heart: Regular rate and rhythm. No rubs, murmurs, or gallops. Abdomen: Soft, nontender, nondistended. No organomegaly noted, normoactive bowel sounds. Musculoskeletal: No edema, cyanosis, or clubbing. Neuro: Alert, answering all questions appropriately. Cranial nerves grossly intact. Skin: No rashes or petechiae noted. Psych: Normal affect.   LAB RESULTS:  Lab Results  Component Value Date   NA 135 07/04/2015   K 3.6 07/04/2015   CL 102 07/04/2015   CO2 27 07/04/2015   GLUCOSE 129* 07/04/2015   BUN 9 07/04/2015   CREATININE 0.47 07/04/2015   CALCIUM 8.4* 07/04/2015   PROT 6.5 06/13/2015   ALBUMIN 3.2* 06/13/2015   AST 42* 06/13/2015   ALT 24 06/13/2015   ALKPHOS 68 06/13/2015   BILITOT 0.6 06/13/2015   GFRNONAA >60 07/04/2015   GFRAA >60 07/04/2015    Lab Results  Component Value Date   WBC 3.1* 07/04/2015   NEUTROABS 2.0 07/04/2015   HGB 8.8* 07/04/2015   HCT 26.2* 07/04/2015   MCV 100.8* 07/04/2015   PLT 115* 07/04/2015     STUDIES: No results found.  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  PLAN:    1.  Breast cancer: Given a triple negative status of her disease patient will definitely benefit from adjuvant chemotherapy. Patient has completed her MammoSite therapy and has tolerated it well. Pretreatment MUGA revealed an EF of 67%.  She has also completed 4 cycles of Adriamycin and Cytoxan. Proceed with cycle 4 of 12 of weekly Taxol today. Will dose reduce Taxol 10%.  Return to clinic in 1 week for consideration of cycle 5.  2. Nausea: Continue Compazine as prescribed. 3. Anxiety: Monitor. 4. Leukopenia: Improved. Secondary to chemotherapy, monitor.  Dose reduce taxol as above. 5. Anemia: Secondary to chemotherapy, monitor. We will consider a blood transfusion in the near future if her hemoglobin continues to drop. 6. Hypotension: Consider IV fluids  in the future if necessary. 7. Thrombocytopenia: Secondary to chemotherapy, dose reduction as above.  Patient expressed understanding and was in agreement with this plan. She also understands that She can call clinic at any time with any questions, concerns, or complaints.   Breast cancer   Staging form: Breast, AJCC 7th Edition     Clinical stage from 03/26/2015: Stage IA (T1c, N0, M0) - Signed by Lloyd Huger, MD on 03/26/2015   Lloyd Huger, MD   07/04/2015 2:38 PM

## 2015-07-09 ENCOUNTER — Ambulatory Visit: Payer: BLUE CROSS/BLUE SHIELD

## 2015-07-09 ENCOUNTER — Other Ambulatory Visit: Payer: BLUE CROSS/BLUE SHIELD

## 2015-07-09 ENCOUNTER — Ambulatory Visit: Payer: BLUE CROSS/BLUE SHIELD | Admitting: Oncology

## 2015-07-10 ENCOUNTER — Other Ambulatory Visit: Payer: Self-pay | Admitting: *Deleted

## 2015-07-10 DIAGNOSIS — C50911 Malignant neoplasm of unspecified site of right female breast: Secondary | ICD-10-CM

## 2015-07-11 ENCOUNTER — Telehealth: Payer: Self-pay | Admitting: Pharmacist

## 2015-07-11 ENCOUNTER — Inpatient Hospital Stay: Payer: BLUE CROSS/BLUE SHIELD

## 2015-07-11 ENCOUNTER — Inpatient Hospital Stay (HOSPITAL_BASED_OUTPATIENT_CLINIC_OR_DEPARTMENT_OTHER): Payer: BLUE CROSS/BLUE SHIELD | Admitting: Oncology

## 2015-07-11 ENCOUNTER — Telehealth: Payer: Self-pay

## 2015-07-11 VITALS — BP 98/65 | HR 86 | Resp 20

## 2015-07-11 DIAGNOSIS — Z171 Estrogen receptor negative status [ER-]: Secondary | ICD-10-CM | POA: Diagnosis not present

## 2015-07-11 DIAGNOSIS — D701 Agranulocytosis secondary to cancer chemotherapy: Secondary | ICD-10-CM | POA: Diagnosis not present

## 2015-07-11 DIAGNOSIS — C50511 Malignant neoplasm of lower-outer quadrant of right female breast: Secondary | ICD-10-CM | POA: Diagnosis not present

## 2015-07-11 DIAGNOSIS — D6481 Anemia due to antineoplastic chemotherapy: Secondary | ICD-10-CM

## 2015-07-11 DIAGNOSIS — C50911 Malignant neoplasm of unspecified site of right female breast: Secondary | ICD-10-CM

## 2015-07-11 DIAGNOSIS — Z79899 Other long term (current) drug therapy: Secondary | ICD-10-CM

## 2015-07-11 DIAGNOSIS — I959 Hypotension, unspecified: Secondary | ICD-10-CM

## 2015-07-11 DIAGNOSIS — D72819 Decreased white blood cell count, unspecified: Secondary | ICD-10-CM | POA: Diagnosis not present

## 2015-07-11 LAB — BASIC METABOLIC PANEL
ANION GAP: 4 — AB (ref 5–15)
BUN: 11 mg/dL (ref 6–20)
CO2: 28 mmol/L (ref 22–32)
Calcium: 8.4 mg/dL — ABNORMAL LOW (ref 8.9–10.3)
Chloride: 104 mmol/L (ref 101–111)
Creatinine, Ser: 0.51 mg/dL (ref 0.44–1.00)
GFR calc Af Amer: >60 mL/min (ref >60)
Glucose, Bld: 125 mg/dL — ABNORMAL HIGH (ref 65–99)
Potassium: 3.4 mmol/L — ABNORMAL LOW (ref 3.5–5.1)
SODIUM: 136 mmol/L (ref 135–145)

## 2015-07-11 LAB — CBC WITH DIFFERENTIAL/PLATELET
BASOS ABS: 0 10*3/uL (ref 0–0.1)
BASOS PCT: 2 %
EOS ABS: 0.3 10*3/uL (ref 0–0.7)
EOS PCT: 18 %
HCT: 24.7 % — ABNORMAL LOW (ref 35.0–47.0)
HEMOGLOBIN: 8.3 g/dL — AB (ref 12.0–16.0)
Lymphocytes Relative: 16 %
Lymphs Abs: 0.3 10*3/uL — ABNORMAL LOW (ref 1.0–3.6)
MCH: 34.3 pg — AB (ref 26.0–34.0)
MCHC: 33.6 g/dL (ref 32.0–36.0)
MCV: 102.1 fL — AB (ref 80.0–100.0)
MONO ABS: 0.2 10*3/uL (ref 0.2–0.9)
MONOS PCT: 9 %
NEUTROS ABS: 1 10*3/uL — AB (ref 1.4–6.5)
Neutrophils Relative %: 55 %
Platelets: 99 10*3/uL — ABNORMAL LOW (ref 150–440)
RBC: 2.42 MIL/uL — AB (ref 3.80–5.20)
RDW: 21.7 % — ABNORMAL HIGH (ref 11.5–14.5)
WBC: 1.8 10*3/uL — ABNORMAL LOW (ref 3.6–11.0)

## 2015-07-11 MED ORDER — SODIUM CHLORIDE 0.9 % IV SOLN
Freq: Once | INTRAVENOUS | Status: AC
  Administered 2015-07-11: 14:00:00 via INTRAVENOUS
  Filled 2015-07-11: qty 1000

## 2015-07-11 MED ORDER — DEXTROSE 5 % IV SOLN
72.0000 mg/m2 | Freq: Once | INTRAVENOUS | Status: AC
  Administered 2015-07-11: 126 mg via INTRAVENOUS
  Filled 2015-07-11: qty 21

## 2015-07-11 MED ORDER — DIPHENHYDRAMINE HCL 50 MG/ML IJ SOLN
25.0000 mg | Freq: Once | INTRAMUSCULAR | Status: AC
Start: 2015-07-11 — End: 2015-07-11
  Administered 2015-07-11: 25 mg via INTRAVENOUS
  Filled 2015-07-11: qty 1

## 2015-07-11 MED ORDER — HEPARIN SOD (PORK) LOCK FLUSH 100 UNIT/ML IV SOLN
500.0000 [IU] | Freq: Once | INTRAVENOUS | Status: AC | PRN
  Administered 2015-07-11: 500 [IU]

## 2015-07-11 MED ORDER — FAMOTIDINE IN NACL 20-0.9 MG/50ML-% IV SOLN
20.0000 mg | Freq: Once | INTRAVENOUS | Status: AC
  Administered 2015-07-11: 20 mg via INTRAVENOUS
  Filled 2015-07-11: qty 50

## 2015-07-11 MED ORDER — SODIUM CHLORIDE 0.9 % IV SOLN
Freq: Once | INTRAVENOUS | Status: AC
  Administered 2015-07-11: 15:00:00 via INTRAVENOUS
  Filled 2015-07-11: qty 4

## 2015-07-11 MED ORDER — HEPARIN SOD (PORK) LOCK FLUSH 100 UNIT/ML IV SOLN: 250.0000 [IU] | Freq: Once | INTRAVENOUS | Status: DC | PRN

## 2015-07-11 MED ORDER — HEPARIN SOD (PORK) LOCK FLUSH 100 UNIT/ML IV SOLN
INTRAVENOUS | Status: AC
  Filled 2015-07-11: qty 5

## 2015-07-11 NOTE — Progress Notes (Signed)
[place]  Telephone:(336) [phone] [phone]) [phone]  ID: Wanda Lopez OB: March 27, 1961  MR#: [id number]  LYY#:[id number]  Patient Care Team: Maceo Pro, MD as PCP - General (Obstetrics and Gynecology) Gae Dry, MD as Referring Physician (Obstetrics and Gynecology) Robert Bellow, MD (General Surgery)  CHIEF COMPLAINT:  Chief Complaint  Patient presents with  . Follow-up    breast cancer    INTERVAL HISTORY: Patient returns to clinic today for further evaluation and consideration of cycle 5 of 12 of weekly Taxol. She does not complain of weakness or fatigue today. She does not complain of dizziness today. She continues to be anxious. She has no neurologic complaints. She denies any recent fevers. She denies any chest pain or shortness of breath. She denies any vomiting, constipation, or diarrhea. She has no urinary complaints. Patient offers no further specific complaints.  REVIEW OF SYSTEMS:   Review of Systems  Constitutional: Negative for fever and malaise/fatigue.  Neurological: Negative for weakness.  Endo/Heme/Allergies: Does not bruise/bleed easily.  Psychiatric/Behavioral: The patient is nervous/anxious.     As per HPI. Otherwise, a complete review of systems is negatve.  PAST MEDICAL HISTORY: Past Medical History  Diagnosis Date  . 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 Wide excision, sentinel node biopsy.  . Breast cancer   . Dehydration 05/28/2015    PAST SURGICAL HISTORY: Past Surgical History  Procedure Laterality Date  . Colonoscopy  2013    Dr Vira Agar  . Breast biopsy Right 02-09-15    wide excision/sentinel node biopsy.  . Portacath placement  03-29-15    Dr Bary Castilla    FAMILY HISTORY Family History  Problem Relation Age of Onset  . Hypertension Mother   . Hypertension Father   . Diabetes Father   . Heart failure Mother         ADVANCED DIRECTIVES:    HEALTH MAINTENANCE: Social History  Substance Use Topics  . Smoking status: Never Smoker   . Smokeless tobacco: Never Used  . Alcohol Use: No     Colonoscopy:  PAP:  Bone density:  Lipid panel:  No Known Allergies  Current Outpatient Prescriptions  Medication Sig Dispense Refill  . acetaminophen (TYLENOL) 325 MG tablet Take 650 mg by mouth every 6 (six) hours as needed.    . Diphenhyd-Hydrocort-Nystatin (FIRST-DUKES MOUTHWASH) SUSP Use as directed 5 mLs in the mouth or throat 3 (three) times daily as needed. 237 mL 1  . DiphenhydrAMINE HCl, Sleep, (UNISOM SLEEPGELS PO) Take by mouth as needed.    . lidocaine-prilocaine (EMLA) cream Apply to port and cover with saran wrap 1-2 hours before chemotherapy 30 g 2  . loratadine (CLARITIN) 10 MG tablet Take 10 mg by mouth daily as needed for allergies.    Marland Kitchen omeprazole (PRILOSEC) 20 MG capsule Take 1 capsule (20 mg total) by mouth daily. May increase to twice a day if once daily does not help after 3 days 45 capsule 2  . prochlorperazine (COMPAZINE) 10 MG tablet Take 1 tablet by mouth 3 (three) times daily as needed.  0   No current facility-administered medications for this visit.   Facility-Administered Medications Ordered in Other Visits  Medication Dose Route Frequency Provider Last Rate Last Dose  . diphenhydrAMINE (BENADRYL) injection 25 mg  25 mg Intravenous Once Lloyd Huger, MD      . famotidine (PEPCID) IVPB 20 mg premix  20 mg Intravenous  Once Lloyd Huger, MD      . PACLitaxel (TAXOL) 126 mg in dextrose 5 % 250 mL chemo infusion (</= 32m/m2)  72 mg/m2 (Treatment Plan Actual) Intravenous Once TLloyd Huger MD      . sodium chloride 0.9 % injection 10 mL  10 mL Intracatheter PRN TLloyd Huger MD   10 mL at 06/13/15 1403    OBJECTIVE: Filed Vitals:   07/11/15 1356  BP: 102/65  Pulse: 103  Temp: 98.6 F (37 C)  Resp: 16     Body mass index is 24.81 kg/(m^2).     ECOG FS:0 - Asymptomatic  General: Well-developed, well-nourished, no acute distress. Eyes: Anicteric sclera. Breasts: Exam deferred today. Lungs: Clear to auscultation bilaterally. Heart: Regular rate and rhythm. No rubs, murmurs, or gallops. Abdomen: Soft, nontender, nondistended. No organomegaly noted, normoactive bowel sounds. Musculoskeletal: No edema, cyanosis, or clubbing. Neuro: Alert, answering all questions appropriately. Cranial nerves grossly intact. Skin: No rashes or petechiae noted. Psych: Normal affect.   LAB RESULTS:  Lab Results  Component Value Date   NA 136 07/11/2015   K 3.4* 07/11/2015   CL 104 07/11/2015   CO2 28 07/11/2015   GLUCOSE 125* 07/11/2015   BUN 11 07/11/2015   CREATININE 0.51 07/11/2015   CALCIUM 8.4* 07/11/2015   PROT 6.5 06/13/2015   ALBUMIN 3.2* 06/13/2015   AST 42* 06/13/2015   ALT 24 06/13/2015   ALKPHOS 68 06/13/2015   BILITOT 0.6 06/13/2015   GFRNONAA >60 07/11/2015   GFRAA >60 07/11/2015    Lab Results  Component Value Date   WBC 1.8* 07/11/2015   NEUTROABS 1.0* 07/11/2015   HGB 8.3* 07/11/2015   HCT 24.7* 07/11/2015   MCV 102.1* 07/11/2015   PLT 99* 07/11/2015     STUDIES: No results found.  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  PLAN:    1. Breast cancer: Given a triple negative status of her disease patient will definitely benefit from adjuvant chemotherapy. Patient has completed her MammoSite therapy and has tolerated it well. Pretreatment MUGA revealed an EF of 67%.  She has also completed 4 cycles of Adriamycin and Cytoxan. Proceed with cycle 5 of 12 of weekly Taxol today. Continue with dose reduced Taxol 10%.  Return to clinic in 1 week for consideration of cycle 6.  2. Nausea: Continue Compazine as prescribed. 3. Anxiety: Monitor. 4. Neutropenia: Secondary to chemotherapy, monitor.  Dose reduce taxol as above. 5. Anemia: Secondary to chemotherapy,  monitor. We will consider a blood transfusion in the near future if her hemoglobin continues to drop. 6. Hypotension: Consider IV fluids in the future if necessary. 7. Thrombocytopenia: Secondary to chemotherapy, dose reduction as above.  Patient expressed understanding and was in agreement with this plan. She also understands that She can call clinic at any time with any questions, concerns, or complaints.   Breast cancer   Staging form: Breast, AJCC 7th Edition     Clinical stage from 03/26/2015: Stage IA (T1c, N0, M0) - Signed by TLloyd Huger MD on 03/26/2015   TLloyd Huger MD   07/11/2015 3:19 PM

## 2015-07-11 NOTE — Telephone Encounter (Signed)
As of today 07/11/15, this is Cycle #5. Patient did not get taxol on Cycle 1 and Cycle 4.

## 2015-07-11 NOTE — Telephone Encounter (Signed)
Neutrophils =1.0.  MD verified to go ahead with treatment today

## 2015-07-17 ENCOUNTER — Other Ambulatory Visit: Payer: Self-pay | Admitting: *Deleted

## 2015-07-17 DIAGNOSIS — C50911 Malignant neoplasm of unspecified site of right female breast: Secondary | ICD-10-CM

## 2015-07-18 ENCOUNTER — Ambulatory Visit: Payer: BLUE CROSS/BLUE SHIELD

## 2015-07-18 ENCOUNTER — Other Ambulatory Visit: Payer: BLUE CROSS/BLUE SHIELD

## 2015-07-18 ENCOUNTER — Ambulatory Visit: Payer: BLUE CROSS/BLUE SHIELD | Admitting: Oncology

## 2015-07-18 ENCOUNTER — Inpatient Hospital Stay: Payer: BLUE CROSS/BLUE SHIELD

## 2015-07-18 ENCOUNTER — Inpatient Hospital Stay (HOSPITAL_BASED_OUTPATIENT_CLINIC_OR_DEPARTMENT_OTHER): Payer: BLUE CROSS/BLUE SHIELD | Admitting: Oncology

## 2015-07-18 VITALS — BP 108/70 | HR 103 | Temp 97.0°F | Resp 18 | Wt 144.6 lb

## 2015-07-18 DIAGNOSIS — D72819 Decreased white blood cell count, unspecified: Secondary | ICD-10-CM | POA: Diagnosis not present

## 2015-07-18 DIAGNOSIS — I959 Hypotension, unspecified: Secondary | ICD-10-CM

## 2015-07-18 DIAGNOSIS — D701 Agranulocytosis secondary to cancer chemotherapy: Secondary | ICD-10-CM

## 2015-07-18 DIAGNOSIS — D6481 Anemia due to antineoplastic chemotherapy: Secondary | ICD-10-CM

## 2015-07-18 DIAGNOSIS — C50911 Malignant neoplasm of unspecified site of right female breast: Secondary | ICD-10-CM

## 2015-07-18 DIAGNOSIS — C50511 Malignant neoplasm of lower-outer quadrant of right female breast: Secondary | ICD-10-CM

## 2015-07-18 DIAGNOSIS — Z171 Estrogen receptor negative status [ER-]: Secondary | ICD-10-CM | POA: Diagnosis not present

## 2015-07-18 DIAGNOSIS — Z79899 Other long term (current) drug therapy: Secondary | ICD-10-CM

## 2015-07-18 LAB — CBC WITH DIFFERENTIAL/PLATELET
Basophils Absolute: 0.1 10*3/uL (ref 0–0.1)
Basophils Relative: 4 %
EOS ABS: 0.1 10*3/uL (ref 0–0.7)
Eosinophils Relative: 8 %
HCT: 25.9 % — ABNORMAL LOW (ref 35.0–47.0)
Hemoglobin: 8.7 g/dL — ABNORMAL LOW (ref 12.0–16.0)
LYMPHS ABS: 0.3 10*3/uL — AB (ref 1.0–3.6)
LYMPHS PCT: 21 %
MCH: 35.1 pg — ABNORMAL HIGH (ref 26.0–34.0)
MCHC: 33.8 g/dL (ref 32.0–36.0)
MCV: 103.8 fL — AB (ref 80.0–100.0)
Monocytes Absolute: 0.1 10*3/uL — ABNORMAL LOW (ref 0.2–0.9)
Monocytes Relative: 9 %
NEUTROS ABS: 1 10*3/uL — AB (ref 1.4–6.5)
NEUTROS PCT: 58 %
Platelets: 132 10*3/uL — ABNORMAL LOW (ref 150–440)
RBC: 2.49 MIL/uL — AB (ref 3.80–5.20)
RDW: 22.3 % — ABNORMAL HIGH (ref 11.5–14.5)
WBC: 1.6 10*3/uL — AB (ref 3.6–11.0)

## 2015-07-18 LAB — BASIC METABOLIC PANEL
Anion gap: 3 — ABNORMAL LOW (ref 5–15)
BUN: 11 mg/dL (ref 6–20)
CO2: 29 mmol/L (ref 22–32)
Calcium: 8.4 mg/dL — ABNORMAL LOW (ref 8.9–10.3)
Chloride: 103 mmol/L (ref 101–111)
Creatinine, Ser: 0.52 mg/dL (ref 0.44–1.00)
GFR calc Af Amer: >60 mL/min (ref >60)
GFR calc non Af Amer: >60 mL/min (ref >60)
Glucose, Bld: 117 mg/dL — ABNORMAL HIGH (ref 65–99)
Potassium: 3.8 mmol/L (ref 3.5–5.1)
SODIUM: 135 mmol/L (ref 135–145)

## 2015-07-18 MED ORDER — SODIUM CHLORIDE 0.9 % IJ SOLN
10.0000 mL | INTRAMUSCULAR | Status: DC | PRN
  Filled 2015-07-18: qty 10

## 2015-07-18 MED ORDER — SODIUM CHLORIDE 0.9 % IV SOLN
Freq: Once | INTRAVENOUS | Status: AC
  Administered 2015-07-18: 16:00:00 via INTRAVENOUS
  Filled 2015-07-18: qty 4

## 2015-07-18 MED ORDER — SODIUM CHLORIDE 0.9 % IJ SOLN
10.0000 mL | Freq: Once | INTRAMUSCULAR | Status: AC
  Administered 2015-07-18: 10 mL via INTRAVENOUS
  Filled 2015-07-18: qty 10

## 2015-07-18 MED ORDER — FAMOTIDINE IN NACL 20-0.9 MG/50ML-% IV SOLN
20.0000 mg | Freq: Once | INTRAVENOUS | Status: AC
  Administered 2015-07-18: 20 mg via INTRAVENOUS
  Filled 2015-07-18: qty 50

## 2015-07-18 MED ORDER — SODIUM CHLORIDE 0.9 % IV SOLN
Freq: Once | INTRAVENOUS | Status: AC
  Administered 2015-07-18: 15:00:00 via INTRAVENOUS
  Filled 2015-07-18: qty 1000

## 2015-07-18 MED ORDER — HEPARIN SOD (PORK) LOCK FLUSH 100 UNIT/ML IV SOLN
500.0000 [IU] | Freq: Once | INTRAVENOUS | Status: DC | PRN
  Filled 2015-07-18: qty 5

## 2015-07-18 MED ORDER — HEPARIN SOD (PORK) LOCK FLUSH 100 UNIT/ML IV SOLN
500.0000 [IU] | Freq: Once | INTRAVENOUS | Status: AC
  Administered 2015-07-18: 500 [IU] via INTRAVENOUS

## 2015-07-18 MED ORDER — DIPHENHYDRAMINE HCL 50 MG/ML IJ SOLN
25.0000 mg | Freq: Once | INTRAMUSCULAR | Status: AC
Start: 2015-07-18 — End: 2015-07-18
  Administered 2015-07-18: 25 mg via INTRAVENOUS
  Filled 2015-07-18: qty 1

## 2015-07-18 MED ORDER — PACLITAXEL CHEMO INJECTION 300 MG/50ML
72.0000 mg/m2 | Freq: Once | INTRAVENOUS | Status: AC
  Administered 2015-07-18: 126 mg via INTRAVENOUS
  Filled 2015-07-18: qty 21

## 2015-07-22 NOTE — Progress Notes (Signed)
[place]  Telephone:(336) [phone] Fax:(336) [phone]  ID: [name] OB: 1961-08-17  MR#: [id number]  FUX#:[id number]  Patient Care Team: Maceo Pro, MD as PCP - General (Obstetrics and Gynecology) Gae Dry, MD as Referring Physician (Obstetrics and Gynecology) Robert Bellow, MD (General Surgery)  CHIEF COMPLAINT:  Chief Complaint  Patient presents with  . Follow-up    breast cancer  . Chemotherapy    INTERVAL HISTORY: Patient returns to clinic today for further evaluation and consideration of cycle 6 of 12 of weekly Taxol. She does not complain of weakness or fatigue today. She does not complain of dizziness today. She continues to be anxious. She has no neurologic complaints. She denies any recent fevers. She denies any chest pain or shortness of breath. She denies any vomiting, constipation, or diarrhea. She has no urinary complaints. Patient offers no specific complaints today.  REVIEW OF SYSTEMS:   Review of Systems  Constitutional: Negative for fever and malaise/fatigue.  Neurological: Negative for weakness.  Endo/Heme/Allergies: Does not bruise/bleed easily.  Psychiatric/Behavioral: The patient is nervous/anxious.     As per HPI. Otherwise, a complete review of systems is negatve.  PAST MEDICAL HISTORY: Past Medical History  Diagnosis Date  . 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 Wide excision, sentinel node biopsy.  . Breast cancer   . Dehydration 05/28/2015    PAST SURGICAL HISTORY: Past Surgical History  Procedure Laterality Date  . Colonoscopy  2013    Dr Vira Agar  . Breast biopsy Right 02-09-15    wide excision/sentinel node biopsy.  . Portacath placement  03-29-15    Dr Bary Castilla    FAMILY HISTORY Family History  Problem Relation Age of Onset  . Hypertension Mother   . Hypertension Father   . Diabetes Father   . Heart failure Mother          ADVANCED DIRECTIVES:    HEALTH MAINTENANCE: Social History  Substance Use Topics  . Smoking status: Never Smoker   . Smokeless tobacco: Never Used  . Alcohol Use: No     Colonoscopy:  PAP:  Bone density:  Lipid panel:  No Known Allergies  Current Outpatient Prescriptions  Medication Sig Dispense Refill  . acetaminophen (TYLENOL) 325 MG tablet Take 650 mg by mouth every 6 (six) hours as needed.    . Diphenhyd-Hydrocort-Nystatin (FIRST-DUKES MOUTHWASH) SUSP Use as directed 5 mLs in the mouth or throat 3 (three) times daily as needed. 237 mL 1  . DiphenhydrAMINE HCl, Sleep, (UNISOM SLEEPGELS PO) Take by mouth as needed.    . lidocaine-prilocaine (EMLA) cream Apply to port and cover with saran wrap 1-2 hours before chemotherapy 30 g 2  . loratadine (CLARITIN) 10 MG tablet Take 10 mg by mouth daily as needed for allergies.    Marland Kitchen prochlorperazine (COMPAZINE) 10 MG tablet Take 1 tablet by mouth 3 (three) times daily as needed.  0  . omeprazole (PRILOSEC) 20 MG capsule Take 1 capsule (20 mg total) by mouth daily. May increase to twice a day if once daily does not help after 3 days (Patient not taking: Reported on 07/18/2015) 45 capsule 2   No current facility-administered medications for this visit.   Facility-Administered Medications Ordered in Other Visits  Medication Dose Route Frequency Provider Last Rate Last Dose  . sodium chloride 0.9 % injection 10 mL  10 mL Intracatheter PRN Lloyd Huger, MD   10 mL  at 06/13/15 1403    OBJECTIVE: Filed Vitals:   07/18/15 1451  BP: 108/70  Pulse: 103  Temp: 97 F (36.1 C)  Resp: 18     Body mass index is 24.81 kg/(m^2).    ECOG FS:0 - Asymptomatic  General: Well-developed, well-nourished, no acute distress. Eyes: Anicteric sclera. Breasts: Exam deferred today. Lungs: Clear to auscultation bilaterally. Heart: Regular rate and rhythm. No rubs, murmurs, or gallops. Abdomen: Soft, nontender, nondistended. No organomegaly  noted, normoactive bowel sounds. Musculoskeletal: No edema, cyanosis, or clubbing. Neuro: Alert, answering all questions appropriately. Cranial nerves grossly intact. Skin: No rashes or petechiae noted. Psych: Normal affect.   LAB RESULTS:  Lab Results  Component Value Date   NA 135 07/18/2015   K 3.8 07/18/2015   CL 103 07/18/2015   CO2 29 07/18/2015   GLUCOSE 117* 07/18/2015   BUN 11 07/18/2015   CREATININE 0.52 07/18/2015   CALCIUM 8.4* 07/18/2015   PROT 6.5 06/13/2015   ALBUMIN 3.2* 06/13/2015   AST 42* 06/13/2015   ALT 24 06/13/2015   ALKPHOS 68 06/13/2015   BILITOT 0.6 06/13/2015   GFRNONAA >60 07/18/2015   GFRAA >60 07/18/2015    Lab Results  Component Value Date   WBC 1.6* 07/18/2015   NEUTROABS 1.0* 07/18/2015   HGB 8.7* 07/18/2015   HCT 25.9* 07/18/2015   MCV 103.8* 07/18/2015   PLT 132* 07/18/2015     STUDIES: No results found.  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  PLAN:    1. Breast cancer: Given a triple negative status of her disease patient will definitely benefit from adjuvant chemotherapy. Patient has completed her MammoSite therapy and has tolerated it well. Pretreatment MUGA revealed an EF of 67%.  She has also completed 4 cycles of Adriamycin and Cytoxan. Proceed with cycle 6 of 12 of weekly Taxol today. Continue with dose reduced Taxol 10%.  Return to clinic in 1 week for consideration of cycle 6.  2. Nausea: Continue Compazine as prescribed. 3. Anxiety: Monitor. 4. Neutropenia: Secondary to chemotherapy, monitor.  Dose reduce taxol as above. 5. Anemia: Secondary to chemotherapy, monitor. Will consider a blood transfusion in the near future if her hemoglobin continues to drop. 6. Hypotension: Improved. Consider IV fluids in the future if necessary. 7. Thrombocytopenia: Secondary to chemotherapy, dose reduction as above.  Patient expressed understanding and was in agreement with this  plan. She also understands that She can call clinic at any time with any questions, concerns, or complaints.   Breast cancer   Staging form: Breast, AJCC 7th Edition     Clinical stage from 03/26/2015: Stage IA (T1c, N0, M0) - Signed by Lloyd Huger, MD on 03/26/2015   Lloyd Huger, MD   07/22/2015 9:53 AM

## 2015-07-24 ENCOUNTER — Other Ambulatory Visit: Payer: Self-pay | Admitting: *Deleted

## 2015-07-24 DIAGNOSIS — C50911 Malignant neoplasm of unspecified site of right female breast: Secondary | ICD-10-CM

## 2015-07-25 ENCOUNTER — Inpatient Hospital Stay (HOSPITAL_BASED_OUTPATIENT_CLINIC_OR_DEPARTMENT_OTHER): Payer: BLUE CROSS/BLUE SHIELD | Admitting: Family Medicine

## 2015-07-25 ENCOUNTER — Inpatient Hospital Stay: Payer: BLUE CROSS/BLUE SHIELD

## 2015-07-25 DIAGNOSIS — D72819 Decreased white blood cell count, unspecified: Secondary | ICD-10-CM | POA: Diagnosis not present

## 2015-07-25 DIAGNOSIS — C50911 Malignant neoplasm of unspecified site of right female breast: Secondary | ICD-10-CM

## 2015-07-25 DIAGNOSIS — D701 Agranulocytosis secondary to cancer chemotherapy: Secondary | ICD-10-CM

## 2015-07-25 DIAGNOSIS — C50511 Malignant neoplasm of lower-outer quadrant of right female breast: Secondary | ICD-10-CM

## 2015-07-25 DIAGNOSIS — Z171 Estrogen receptor negative status [ER-]: Secondary | ICD-10-CM

## 2015-07-25 DIAGNOSIS — Z79899 Other long term (current) drug therapy: Secondary | ICD-10-CM

## 2015-07-25 DIAGNOSIS — D6481 Anemia due to antineoplastic chemotherapy: Secondary | ICD-10-CM

## 2015-07-25 DIAGNOSIS — I959 Hypotension, unspecified: Secondary | ICD-10-CM

## 2015-07-25 LAB — CBC WITH DIFFERENTIAL/PLATELET
BASOS ABS: 0 10*3/uL (ref 0–0.1)
EOS ABS: 0.1 10*3/uL (ref 0–0.7)
HCT: 25.1 % — ABNORMAL LOW (ref 35.0–47.0)
Hemoglobin: 8.6 g/dL — ABNORMAL LOW (ref 12.0–16.0)
Lymphocytes Relative: 26 %
Lymphs Abs: 0.4 10*3/uL — ABNORMAL LOW (ref 1.0–3.6)
MCH: 35.8 pg — ABNORMAL HIGH (ref 26.0–34.0)
MCHC: 34.1 g/dL (ref 32.0–36.0)
MCV: 104.9 fL — ABNORMAL HIGH (ref 80.0–100.0)
MONO ABS: 0.1 10*3/uL — AB (ref 0.2–0.9)
NEUTROS ABS: 0.8 10*3/uL — AB (ref 1.4–6.5)
Neutrophils Relative %: 58 %
PLATELETS: 123 10*3/uL — AB (ref 150–440)
RBC: 2.39 MIL/uL — ABNORMAL LOW (ref 3.80–5.20)
RDW: 22.1 % — AB (ref 11.5–14.5)
WBC: 1.4 10*3/uL — CL (ref 3.6–11.0)

## 2015-07-25 LAB — BASIC METABOLIC PANEL
ANION GAP: 4 — AB (ref 5–15)
BUN: 8 mg/dL (ref 6–20)
CALCIUM: 8.5 mg/dL — AB (ref 8.9–10.3)
CO2: 28 mmol/L (ref 22–32)
CREATININE: 0.48 mg/dL (ref 0.44–1.00)
Chloride: 104 mmol/L (ref 101–111)
Glucose, Bld: 130 mg/dL — ABNORMAL HIGH (ref 65–99)
Potassium: 3.4 mmol/L — ABNORMAL LOW (ref 3.5–5.1)
SODIUM: 136 mmol/L (ref 135–145)

## 2015-07-25 MED ORDER — SODIUM CHLORIDE 0.9 % IJ SOLN
10.0000 mL | INTRAMUSCULAR | Status: DC | PRN
  Administered 2015-07-25: 10 mL via INTRAVENOUS
  Filled 2015-07-25: qty 10

## 2015-07-25 MED ORDER — HEPARIN SOD (PORK) LOCK FLUSH 100 UNIT/ML IV SOLN
INTRAVENOUS | Status: AC
  Filled 2015-07-25: qty 5

## 2015-07-25 MED ORDER — HEPARIN SOD (PORK) LOCK FLUSH 100 UNIT/ML IV SOLN
500.0000 [IU] | Freq: Once | INTRAVENOUS | Status: AC
  Administered 2015-07-25: 500 [IU] via INTRAVENOUS

## 2015-07-25 NOTE — Progress Notes (Signed)
Patient is on Taxol treatment.  Right thumb nail is loosening.  Pad of thumb on right hand is larger than left hand. 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

## 2015-07-26 NOTE — Progress Notes (Signed)
[place]  Telephone:(336) [phone] Fax:(336) [phone]  ID: Wanda Lopez OB: 18-Jul-1961  MR#: [id number]  FFM#:[id number]  Date of service 07/25/2015.  Patient Care Team: Maceo Pro, MD as PCP - General (Obstetrics and Gynecology) Gae Dry, MD as Referring Physician (Obstetrics and Gynecology) Robert Bellow, MD (General Surgery)  CHIEF COMPLAINT:  Chief Complaint  Patient presents with  . Follow-up    INTERVAL HISTORY: Patient returns to clinic today for further evaluation and consideration of cycle 7 of 12 of weekly Taxol. She does not complain of weakness or fatigue today. She does not complain of dizziness today. She continues to be anxious. She has no neurologic complaints. She denies any recent fevers. She denies any chest pain or shortness of breath. She denies any vomiting, constipation, or diarrhea. She has no urinary complaints. Patient offers no specific complaints today.  REVIEW OF SYSTEMS:   Review of Systems  Constitutional: Negative for fever, chills, weight loss, malaise/fatigue and diaphoresis.  HENT: Negative for congestion, ear discharge, ear pain, hearing loss, nosebleeds, sore throat and tinnitus.   Eyes: Negative for blurred vision, double vision, photophobia, pain, discharge and redness.  Respiratory: Negative for cough, hemoptysis, sputum production, shortness of breath, wheezing and stridor.   Cardiovascular: Negative for chest pain, palpitations, orthopnea, claudication, leg swelling and PND.  Gastrointestinal: Negative for heartburn, nausea, vomiting, abdominal pain, diarrhea, constipation, blood in stool and melena.  Genitourinary: Negative.   Musculoskeletal: Negative.   Skin: Negative.   Neurological: Negative for dizziness, tingling, focal weakness, seizures, weakness and headaches.  Endo/Heme/Allergies: Does not bruise/bleed easily.  Psychiatric/Behavioral: Negative for depression. The patient is nervous/anxious.  The patient does not have insomnia.     As per HPI. Otherwise, a complete review of systems is negatve.  PAST MEDICAL HISTORY: Past Medical History  Diagnosis Date  . 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 Wide excision, sentinel node biopsy.  . Breast cancer   . Dehydration 05/28/2015    PAST SURGICAL HISTORY: Past Surgical History  Procedure Laterality Date  . Colonoscopy  2013    Dr Vira Agar  . Breast biopsy Right 02-09-15    wide excision/sentinel node biopsy.  . Portacath placement  03-29-15    Dr Bary Castilla    FAMILY HISTORY Family History  Problem Relation Age of Onset  . Hypertension Mother   . Hypertension Father   . Diabetes Father   . Heart failure Mother        ADVANCED DIRECTIVES:    HEALTH MAINTENANCE: Social History  Substance Use Topics  . Smoking status: Never Smoker   . Smokeless tobacco: Never Used  . Alcohol Use: No     Colonoscopy:  PAP:  Bone density:  Lipid panel:  No Known Allergies  Current Outpatient Prescriptions  Medication Sig Dispense Refill  . acetaminophen (TYLENOL) 325 MG tablet Take 650 mg by mouth every 6 (six) hours as needed.    . Diphenhyd-Hydrocort-Nystatin (FIRST-DUKES MOUTHWASH) SUSP Use as directed 5 mLs in the mouth or throat 3 (three) times daily as needed. 237 mL 1  . lidocaine-prilocaine (EMLA) cream Apply to port and cover with saran wrap 1-2 hours before chemotherapy 30 g 2  . omeprazole (PRILOSEC) 20 MG capsule Take 1 capsule (20 mg total) by mouth daily. May increase to twice a day if once daily does not help after 3 days 45 capsule 2  . prochlorperazine (COMPAZINE) 10 MG tablet  Take 1 tablet by mouth 3 (three) times daily as needed.  0  . DiphenhydrAMINE HCl, Sleep, (UNISOM SLEEPGELS PO) Take by mouth as needed.    . loratadine (CLARITIN) 10 MG tablet Take 10 mg by mouth daily as needed for allergies.     No current  facility-administered medications for this visit.   Facility-Administered Medications Ordered in Other Visits  Medication Dose Route Frequency Provider Last Rate Last Dose  . sodium chloride 0.9 % injection 10 mL  10 mL Intracatheter PRN Lloyd Huger, MD   10 mL at 06/13/15 1403    OBJECTIVE: Filed Vitals:   07/25/15 1358  BP: 108/68  Pulse: 96  Temp: 97.3 F (36.3 C)     Body mass index is 24.77 kg/(m^2).    ECOG FS:0 - Asymptomatic  General: Well-developed, well-nourished, no acute distress. Eyes: Anicteric sclera. Breasts: Exam deferred today. Lungs: Clear to auscultation bilaterally. Heart: Regular rate and rhythm. No rubs, murmurs, or gallops. Abdomen: Soft, nontender, nondistended. No organomegaly noted, normoactive bowel sounds. Musculoskeletal: No edema, cyanosis, or clubbing. Neuro: Alert, answering all questions appropriately. Cranial nerves grossly intact. Skin: No rashes or petechiae noted. Psych: Normal affect.   LAB RESULTS:  Lab Results  Component Value Date   NA 136 07/25/2015   K 3.4* 07/25/2015   CL 104 07/25/2015   CO2 28 07/25/2015   GLUCOSE 130* 07/25/2015   BUN 8 07/25/2015   CREATININE 0.48 07/25/2015   CALCIUM 8.5* 07/25/2015   PROT 6.5 06/13/2015   ALBUMIN 3.2* 06/13/2015   AST 42* 06/13/2015   ALT 24 06/13/2015   ALKPHOS 68 06/13/2015   BILITOT 0.6 06/13/2015   GFRNONAA >60 07/25/2015   GFRAA >60 07/25/2015    Lab Results  Component Value Date   WBC 1.4* 07/25/2015   NEUTROABS 0.8* 07/25/2015   HGB 8.6* 07/25/2015   HCT 25.1* 07/25/2015   MCV 104.9* 07/25/2015   PLT 123* 07/25/2015     STUDIES: No results found.  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  PLAN:    1. Breast cancer: Given a triple negative status of her disease patient will definitely benefit from adjuvant chemotherapy. Patient has completed her MammoSite therapy and has tolerated it well.  Pretreatment MUGA revealed an EF of 67%.  She has also completed 4 cycles of Adriamycin and Cytoxan. Proceed with cycle 7 of 12 of weekly Taxol today. Continue with dose reduced Taxol 10%.  Return to clinic in 1 week for consideration of cycle 8.  2. Nausea: Continue Compazine as prescribed. 3. Anxiety: Monitor. 4. Neutropenia: Secondary to chemotherapy, monitor.  Dose reduce taxol as above. 5. Anemia: Secondary to chemotherapy, monitor. Will consider a blood transfusion in the near future if her hemoglobin continues to drop. 6. Hypotension: Improved. Consider IV fluids in the future if necessary. 7. Thrombocytopenia: Secondary to chemotherapy, dose reduction as above.  Patient expressed understanding and was in agreement with this plan. She also understands that She can call clinic at any time with any questions, concerns, or complaints.   Breast cancer   Staging form: Breast, AJCC 7th Edition     Clinical stage from 03/26/2015: Stage IA (T1c, N0, M0) - Signed by Lloyd Huger, MD on 03/26/2015   Evlyn Kanner, NP   07/26/2015 1:39 PM

## 2015-08-01 ENCOUNTER — Inpatient Hospital Stay: Payer: BLUE CROSS/BLUE SHIELD

## 2015-08-01 ENCOUNTER — Inpatient Hospital Stay (HOSPITAL_BASED_OUTPATIENT_CLINIC_OR_DEPARTMENT_OTHER): Payer: BLUE CROSS/BLUE SHIELD | Admitting: Oncology

## 2015-08-01 ENCOUNTER — Ambulatory Visit (INDEPENDENT_AMBULATORY_CARE_PROVIDER_SITE_OTHER): Payer: BLUE CROSS/BLUE SHIELD | Admitting: General Surgery

## 2015-08-01 ENCOUNTER — Encounter: Payer: Self-pay | Admitting: General Surgery

## 2015-08-01 VITALS — BP 100/69 | HR 69 | Temp 97.6°F | Resp 20 | Wt 146.4 lb

## 2015-08-01 VITALS — BP 100/60 | HR 89 | Resp 14 | Ht 64.0 in | Wt 145.6 lb

## 2015-08-01 DIAGNOSIS — Z171 Estrogen receptor negative status [ER-]: Secondary | ICD-10-CM | POA: Diagnosis not present

## 2015-08-01 DIAGNOSIS — D701 Agranulocytosis secondary to cancer chemotherapy: Secondary | ICD-10-CM | POA: Diagnosis not present

## 2015-08-01 DIAGNOSIS — D6481 Anemia due to antineoplastic chemotherapy: Secondary | ICD-10-CM

## 2015-08-01 DIAGNOSIS — D72819 Decreased white blood cell count, unspecified: Secondary | ICD-10-CM | POA: Diagnosis not present

## 2015-08-01 DIAGNOSIS — Z79899 Other long term (current) drug therapy: Secondary | ICD-10-CM

## 2015-08-01 DIAGNOSIS — F419 Anxiety disorder, unspecified: Secondary | ICD-10-CM

## 2015-08-01 DIAGNOSIS — C50511 Malignant neoplasm of lower-outer quadrant of right female breast: Secondary | ICD-10-CM

## 2015-08-01 DIAGNOSIS — R110 Nausea: Secondary | ICD-10-CM

## 2015-08-01 DIAGNOSIS — C50911 Malignant neoplasm of unspecified site of right female breast: Secondary | ICD-10-CM

## 2015-08-01 DIAGNOSIS — I959 Hypotension, unspecified: Secondary | ICD-10-CM

## 2015-08-01 LAB — CBC WITH DIFFERENTIAL/PLATELET
BASOS ABS: 0.1 10*3/uL (ref 0–0.1)
BASOS PCT: 2 %
EOS ABS: 0.1 10*3/uL (ref 0–0.7)
Eosinophils Relative: 5 %
HEMATOCRIT: 29.9 % — AB (ref 35.0–47.0)
Hemoglobin: 10 g/dL — ABNORMAL LOW (ref 12.0–16.0)
Lymphocytes Relative: 18 %
Lymphs Abs: 0.4 10*3/uL — ABNORMAL LOW (ref 1.0–3.6)
MCH: 35.7 pg — ABNORMAL HIGH (ref 26.0–34.0)
MCHC: 33.5 g/dL (ref 32.0–36.0)
MCV: 106.6 fL — ABNORMAL HIGH (ref 80.0–100.0)
MONO ABS: 0.5 10*3/uL (ref 0.2–0.9)
MONOS PCT: 22 %
NEUTROS ABS: 1.2 10*3/uL — AB (ref 1.4–6.5)
NEUTROS PCT: 53 %
Platelets: 157 10*3/uL (ref 150–440)
RBC: 2.81 MIL/uL — ABNORMAL LOW (ref 3.80–5.20)
RDW: 21.2 % — AB (ref 11.5–14.5)
WBC: 2.3 10*3/uL — ABNORMAL LOW (ref 3.6–11.0)

## 2015-08-01 LAB — BASIC METABOLIC PANEL
ANION GAP: 1 — AB (ref 5–15)
BUN: 10 mg/dL (ref 6–20)
CALCIUM: 8.3 mg/dL — AB (ref 8.9–10.3)
CO2: 31 mmol/L (ref 22–32)
CREATININE: 0.47 mg/dL (ref 0.44–1.00)
Chloride: 103 mmol/L (ref 101–111)
Glucose, Bld: 101 mg/dL — ABNORMAL HIGH (ref 65–99)
Potassium: 3.6 mmol/L (ref 3.5–5.1)
SODIUM: 135 mmol/L (ref 135–145)

## 2015-08-01 MED ORDER — SODIUM CHLORIDE 0.9 % IV SOLN
Freq: Once | INTRAVENOUS | Status: AC
  Administered 2015-08-01: 15:00:00 via INTRAVENOUS
  Filled 2015-08-01: qty 1000

## 2015-08-01 MED ORDER — SODIUM CHLORIDE 0.9 % IJ SOLN
10.0000 mL | INTRAMUSCULAR | Status: DC | PRN
  Filled 2015-08-01: qty 10

## 2015-08-01 MED ORDER — FAMOTIDINE IN NACL 20-0.9 MG/50ML-% IV SOLN
20.0000 mg | Freq: Once | INTRAVENOUS | Status: AC
Start: 2015-08-01 — End: 2015-08-01
  Administered 2015-08-01: 20 mg via INTRAVENOUS
  Filled 2015-08-01: qty 50

## 2015-08-01 MED ORDER — PACLITAXEL CHEMO INJECTION 300 MG/50ML
72.0000 mg/m2 | Freq: Once | INTRAVENOUS | Status: AC
  Administered 2015-08-01: 126 mg via INTRAVENOUS
  Filled 2015-08-01: qty 21

## 2015-08-01 MED ORDER — SODIUM CHLORIDE 0.9 % IV SOLN
Freq: Once | INTRAVENOUS | Status: AC
  Administered 2015-08-01: 15:00:00 via INTRAVENOUS
  Filled 2015-08-01: qty 4

## 2015-08-01 MED ORDER — DIPHENHYDRAMINE HCL 50 MG/ML IJ SOLN
12.5000 mg | Freq: Once | INTRAMUSCULAR | Status: AC
  Administered 2015-08-01: 12.5 mg via INTRAVENOUS
  Filled 2015-08-01: qty 1

## 2015-08-01 MED ORDER — SODIUM CHLORIDE 0.9 % IJ SOLN
10.0000 mL | INTRAMUSCULAR | Status: DC | PRN
  Administered 2015-08-01: 10 mL via INTRAVENOUS
  Filled 2015-08-01: qty 10

## 2015-08-01 MED ORDER — HEPARIN SOD (PORK) LOCK FLUSH 100 UNIT/ML IV SOLN
500.0000 [IU] | Freq: Once | INTRAVENOUS | Status: AC
  Administered 2015-08-01: 500 [IU] via INTRAVENOUS
  Filled 2015-08-01: qty 5

## 2015-08-01 NOTE — Progress Notes (Signed)
Patient here today for ongoing follow up and treatment consideration regarding breast cancer. Patient denies any concerns today.  

## 2015-08-01 NOTE — Progress Notes (Signed)
Patient ID: Wanda Lopez, female   DOB: January 02, 1961, 54 y.o.   MRN: [id number]  Chief Complaint  Patient presents with  . Follow-up    right breast cancer    HPI Wanda Lopez is a 54 y.o. female here for right breast cancer follow up. She has 6 more chemotherapy treatments left. She reports that she is doing well. She states just some tingling in the right breast incision site, but no discomfort. She reports no problems with her port site.  HPI  Past Medical History  Diagnosis Date  . 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 Wide excision, sentinel node biopsy.  . Breast cancer   . Dehydration 05/28/2015    Past Surgical History  Procedure Laterality Date  . Colonoscopy  2013    Dr Vira Agar  . Breast biopsy Right 02-09-15    wide excision/sentinel node biopsy.  . Portacath placement  03-29-15    Dr Bary Castilla    Family History  Problem Relation Age of Onset  . Hypertension Mother   . Hypertension Father   . Diabetes Father   . Heart failure Mother     Social History Social History  Substance Use Topics  . Smoking status: Never Smoker   . Smokeless tobacco: Never Used  . Alcohol Use: No    No Known Allergies  Current Outpatient Prescriptions  Medication Sig Dispense Refill  . acetaminophen (TYLENOL) 325 MG tablet Take 650 mg by mouth every 6 (six) hours as needed.    . DiphenhydrAMINE HCl, Sleep, (UNISOM SLEEPGELS PO) Take by mouth as needed.    . loratadine (CLARITIN) 10 MG tablet Take 10 mg by mouth daily as needed for allergies.    Marland Kitchen prochlorperazine (COMPAZINE) 10 MG tablet Take 1 tablet by mouth 3 (three) times daily as needed.  0  . lidocaine-prilocaine (EMLA) cream Apply to port and cover with saran wrap 1-2 hours before chemotherapy 30 g 2   No current facility-administered medications for this visit.   Facility-Administered Medications Ordered in Other Visits  Medication Dose  Route Frequency Provider Last Rate Last Dose  . sodium chloride 0.9 % injection 10 mL  10 mL Intracatheter PRN Lloyd Huger, MD   10 mL at 06/13/15 1403    Review of Systems Review of Systems  Constitutional: Negative.   Respiratory: Negative.   Cardiovascular: Negative.     Blood pressure 100/60, pulse 89, resp. rate 14, height 5\' 4"  (1.626 m), weight 145 lb 9.6 oz (66.044 kg).  Physical Exam Physical Exam  Constitutional: She is oriented to person, place, and time. She appears well-developed and well-nourished.  Eyes: Conjunctivae are normal. No scleral icterus.  Neck: Neck supple.  Cardiovascular: Normal rate, regular rhythm and normal heart sounds.   Pulmonary/Chest: Effort normal and breath sounds normal. Right breast exhibits no inverted nipple, no mass, no nipple discharge, no skin change and no tenderness. Left breast exhibits no inverted nipple, no mass, no nipple discharge, no skin change and no tenderness.    Port site is good.  Right breast well healed incision   Lymphadenopathy:    She has no cervical adenopathy.    She has no axillary adenopathy.  Neurological: She is alert and oriented to person, place, and time.  Skin: Skin is warm and dry.  Psychiatric: She has a normal mood and affect.      Assessment    Doing well now 6 months  status post wide excision, sentinel node biopsy. Near completion of adjuvant chemotherapy.    Plan    We'll arrange for follow-up exam in 6 months with bilateral diagnostic mammograms at that time. This should put her about 3 months post-completion of radiation therapy.    PCP: [facility]   Hervey Ard W 08/01/2015, 9:15 AM

## 2015-08-01 NOTE — Patient Instructions (Addendum)
Continue self breast exams. Call office for any new breast issues or concerns.  Follow up in 7 months

## 2015-08-05 NOTE — Progress Notes (Signed)
[place]  Telephone:(336) [phone] Fax:(336) [phone]  ID: Wanda Lopez OB: [birth year]  MR#: [id number]  AOZ#:[id number]  Patient Care Team: Maceo Pro, MD as PCP - General (Obstetrics and Gynecology) Gae Dry, MD as Referring Physician (Obstetrics and Gynecology) Robert Bellow, MD (General Surgery)  CHIEF COMPLAINT:  Chief Complaint  Patient presents with  . Follow-up    breast cancer  . Chemotherapy    INTERVAL HISTORY: Patient returns to clinic today for further evaluation and consideration of cycle 8 of 12 of weekly Taxol. She does not complain of weakness or fatigue today. She does not complain of dizziness today.  She has no neurologic complaints. She denies any recent fevers. She denies any chest pain or shortness of breath. She denies any vomiting, constipation, or diarrhea. She has no urinary complaints. Patient offers no specific complaints today.  REVIEW OF SYSTEMS:   Review of Systems  Constitutional: Negative for fever and malaise/fatigue.  Neurological: Negative for weakness.  Endo/Heme/Allergies: Does not bruise/bleed easily.  Psychiatric/Behavioral: The patient is not nervous/anxious.     As per HPI. Otherwise, a complete review of systems is negatve.  PAST MEDICAL HISTORY: Past Medical History  Diagnosis Date  . 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 Wide excision, sentinel node biopsy.  . Breast cancer   . Dehydration 05/28/2015    PAST SURGICAL HISTORY: Past Surgical History  Procedure Laterality Date  . Colonoscopy  2013    Dr Vira Agar  . Breast biopsy Right 02-09-15    wide excision/sentinel node biopsy.  . Portacath placement  03-29-15    Dr Bary Castilla    FAMILY HISTORY Family History  Problem Relation Age of Onset  . Hypertension Mother   . Hypertension Father   . Diabetes Father   . Heart failure Mother        ADVANCED  DIRECTIVES:    HEALTH MAINTENANCE: Social History  Substance Use Topics  . Smoking status: Never Smoker   . Smokeless tobacco: Never Used  . Alcohol Use: No     Colonoscopy:  PAP:  Bone density:  Lipid panel:  No Known Allergies  Current Outpatient Prescriptions  Medication Sig Dispense Refill  . acetaminophen (TYLENOL) 325 MG tablet Take 650 mg by mouth every 6 (six) hours as needed.    . DiphenhydrAMINE HCl, Sleep, (UNISOM SLEEPGELS PO) Take by mouth as needed.    . lidocaine-prilocaine (EMLA) cream Apply to port and cover with saran wrap 1-2 hours before chemotherapy 30 g 2  . loratadine (CLARITIN) 10 MG tablet Take 10 mg by mouth daily as needed for allergies.    Marland Kitchen prochlorperazine (COMPAZINE) 10 MG tablet Take 1 tablet by mouth 3 (three) times daily as needed.  0   No current facility-administered medications for this visit.   Facility-Administered Medications Ordered in Other Visits  Medication Dose Route Frequency Provider Last Rate Last Dose  . sodium chloride 0.9 % injection 10 mL  10 mL Intracatheter PRN Lloyd Huger, MD   10 mL at 06/13/15 1403    OBJECTIVE: Filed Vitals:   08/01/15 1426  BP: 100/69  Pulse: 69  Temp: 97.6 F (36.4 C)  Resp: 20     Body mass index is 25.11 kg/(m^2).    ECOG FS:0 - Asymptomatic  General: Well-developed, well-nourished, no acute distress. Eyes: Anicteric sclera. Breasts: Exam deferred today. Lungs: Clear to auscultation bilaterally. Heart: Regular rate and rhythm.  No rubs, murmurs, or gallops. Abdomen: Soft, nontender, nondistended. No organomegaly noted, normoactive bowel sounds. Musculoskeletal: No edema, cyanosis, or clubbing. Neuro: Alert, answering all questions appropriately. Cranial nerves grossly intact. Skin: No rashes or petechiae noted. Psych: Normal affect.   LAB RESULTS:  Lab Results  Component Value Date   NA 135 08/01/2015   K 3.6 08/01/2015   CL 103 08/01/2015   CO2 31 08/01/2015   GLUCOSE  101* 08/01/2015   BUN 10 08/01/2015   CREATININE 0.47 08/01/2015   CALCIUM 8.3* 08/01/2015   PROT 6.5 06/13/2015   ALBUMIN 3.2* 06/13/2015   AST 42* 06/13/2015   ALT 24 06/13/2015   ALKPHOS 68 06/13/2015   BILITOT 0.6 06/13/2015   GFRNONAA >60 08/01/2015   GFRAA >60 08/01/2015    Lab Results  Component Value Date   WBC 2.3* 08/01/2015   NEUTROABS 1.2* 08/01/2015   HGB 10.0* 08/01/2015   HCT 29.9* 08/01/2015   MCV 106.6* 08/01/2015   PLT 157 08/01/2015     STUDIES: No results found.  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  PLAN:    1. Breast cancer: Given a triple negative status of her disease patient will definitely benefit from adjuvant chemotherapy. Patient has completed her MammoSite therapy and has tolerated it well. Pretreatment MUGA revealed an EF of 67%.  She has also completed 4 cycles of Adriamycin and Cytoxan. Proceed with cycle 8 of 12 of weekly Taxol today. Continue with dose reduced Taxol 10%.  Return to clinic in 1 week for consideration of cycle 9.  2. Nausea: Continue Compazine as prescribed. 3. Anxiety: Monitor. 4. Neutropenia: Secondary to chemotherapy, monitor.  Dose reduce taxol as above. 5. Anemia: Secondary to chemotherapy, monitor.  6. Hypotension: Improved. Consider IV fluids in the future if necessary. 7. Thrombocytopenia: Resolved, dose reduction as above.  Patient expressed understanding and was in agreement with this plan. She also understands that She can call clinic at any time with any questions, concerns, or complaints.   Breast cancer   Staging form: Breast, AJCC 7th Edition     Clinical stage from 03/26/2015: Stage IA (T1c, N0, M0) - Signed by Lloyd Huger, MD on 03/26/2015   Lloyd Huger, MD   08/05/2015 10:41 AM

## 2015-08-09 ENCOUNTER — Inpatient Hospital Stay: Payer: BLUE CROSS/BLUE SHIELD

## 2015-08-09 ENCOUNTER — Inpatient Hospital Stay: Payer: BLUE CROSS/BLUE SHIELD | Attending: Oncology

## 2015-08-09 ENCOUNTER — Inpatient Hospital Stay: Payer: BLUE CROSS/BLUE SHIELD | Admitting: Oncology

## 2015-08-09 DIAGNOSIS — Z171 Estrogen receptor negative status [ER-]: Secondary | ICD-10-CM | POA: Diagnosis not present

## 2015-08-09 DIAGNOSIS — Z79899 Other long term (current) drug therapy: Secondary | ICD-10-CM | POA: Diagnosis not present

## 2015-08-09 DIAGNOSIS — D649 Anemia, unspecified: Secondary | ICD-10-CM | POA: Diagnosis not present

## 2015-08-09 DIAGNOSIS — F419 Anxiety disorder, unspecified: Secondary | ICD-10-CM | POA: Insufficient documentation

## 2015-08-09 DIAGNOSIS — C50511 Malignant neoplasm of lower-outer quadrant of right female breast: Secondary | ICD-10-CM | POA: Insufficient documentation

## 2015-08-09 DIAGNOSIS — D701 Agranulocytosis secondary to cancer chemotherapy: Secondary | ICD-10-CM | POA: Insufficient documentation

## 2015-08-09 DIAGNOSIS — C50911 Malignant neoplasm of unspecified site of right female breast: Secondary | ICD-10-CM

## 2015-08-09 DIAGNOSIS — G629 Polyneuropathy, unspecified: Secondary | ICD-10-CM | POA: Diagnosis not present

## 2015-08-09 DIAGNOSIS — Z5111 Encounter for antineoplastic chemotherapy: Secondary | ICD-10-CM | POA: Diagnosis not present

## 2015-08-09 DIAGNOSIS — R110 Nausea: Secondary | ICD-10-CM | POA: Insufficient documentation

## 2015-08-09 LAB — CBC WITH DIFFERENTIAL/PLATELET
BASOS ABS: 0.1 10*3/uL (ref 0–0.1)
BASOS PCT: 2 %
EOS ABS: 0.3 10*3/uL (ref 0–0.7)
EOS PCT: 10 %
HCT: 31 % — ABNORMAL LOW (ref 35.0–47.0)
Hemoglobin: 10.6 g/dL — ABNORMAL LOW (ref 12.0–16.0)
Lymphocytes Relative: 17 %
Lymphs Abs: 0.6 10*3/uL — ABNORMAL LOW (ref 1.0–3.6)
MCH: 36.3 pg — ABNORMAL HIGH (ref 26.0–34.0)
MCHC: 34.1 g/dL (ref 32.0–36.0)
MCV: 106.5 fL — ABNORMAL HIGH (ref 80.0–100.0)
MONO ABS: 0.3 10*3/uL (ref 0.2–0.9)
Monocytes Relative: 9 %
Neutro Abs: 2.1 10*3/uL (ref 1.4–6.5)
Neutrophils Relative %: 62 %
PLATELETS: 162 10*3/uL (ref 150–440)
RBC: 2.91 MIL/uL — AB (ref 3.80–5.20)
RDW: 19.9 % — AB (ref 11.5–14.5)
WBC: 3.4 10*3/uL — AB (ref 3.6–11.0)

## 2015-08-09 LAB — BASIC METABOLIC PANEL
ANION GAP: 6 (ref 5–15)
BUN: 9 mg/dL (ref 6–20)
CALCIUM: 8.7 mg/dL — AB (ref 8.9–10.3)
CO2: 28 mmol/L (ref 22–32)
Chloride: 103 mmol/L (ref 101–111)
Creatinine, Ser: 0.51 mg/dL (ref 0.44–1.00)
GLUCOSE: 117 mg/dL — AB (ref 65–99)
Potassium: 3.4 mmol/L — ABNORMAL LOW (ref 3.5–5.1)
SODIUM: 137 mmol/L (ref 135–145)

## 2015-08-09 MED ORDER — FAMOTIDINE IN NACL 20-0.9 MG/50ML-% IV SOLN
20.0000 mg | Freq: Once | INTRAVENOUS | Status: AC
Start: 2015-08-09 — End: 2015-08-09
  Administered 2015-08-09: 20 mg via INTRAVENOUS
  Filled 2015-08-09: qty 50

## 2015-08-09 MED ORDER — DIPHENHYDRAMINE HCL 50 MG/ML IJ SOLN
12.5000 mg | Freq: Once | INTRAMUSCULAR | Status: AC
  Administered 2015-08-09: 12.5 mg via INTRAVENOUS
  Filled 2015-08-09: qty 1

## 2015-08-09 MED ORDER — PACLITAXEL CHEMO INJECTION 300 MG/50ML
72.0000 mg/m2 | Freq: Once | INTRAVENOUS | Status: AC
  Administered 2015-08-09: 126 mg via INTRAVENOUS
  Filled 2015-08-09: qty 21

## 2015-08-09 MED ORDER — SODIUM CHLORIDE 0.9 % IV SOLN
Freq: Once | INTRAVENOUS | Status: AC
  Administered 2015-08-09: 14:00:00 via INTRAVENOUS
  Filled 2015-08-09: qty 1000

## 2015-08-09 MED ORDER — HEPARIN SOD (PORK) LOCK FLUSH 100 UNIT/ML IV SOLN
500.0000 [IU] | Freq: Once | INTRAVENOUS | Status: AC | PRN
  Administered 2015-08-09: 500 [IU]
  Filled 2015-08-09: qty 5

## 2015-08-09 MED ORDER — SODIUM CHLORIDE 0.9 % IV SOLN
Freq: Once | INTRAVENOUS | Status: AC
  Administered 2015-08-09: 15:00:00 via INTRAVENOUS
  Filled 2015-08-09: qty 4

## 2015-08-09 MED ORDER — SODIUM CHLORIDE 0.9 % IJ SOLN
10.0000 mL | INTRAMUSCULAR | Status: DC | PRN
  Administered 2015-08-09: 10 mL
  Filled 2015-08-09: qty 10

## 2015-08-16 ENCOUNTER — Inpatient Hospital Stay: Payer: BLUE CROSS/BLUE SHIELD

## 2015-08-16 ENCOUNTER — Inpatient Hospital Stay (HOSPITAL_BASED_OUTPATIENT_CLINIC_OR_DEPARTMENT_OTHER): Payer: BLUE CROSS/BLUE SHIELD | Admitting: Oncology

## 2015-08-16 VITALS — BP 109/71 | HR 96 | Temp 98.1°F | Resp 18 | Wt 143.3 lb

## 2015-08-16 DIAGNOSIS — R110 Nausea: Secondary | ICD-10-CM

## 2015-08-16 DIAGNOSIS — Z79899 Other long term (current) drug therapy: Secondary | ICD-10-CM

## 2015-08-16 DIAGNOSIS — Z171 Estrogen receptor negative status [ER-]: Secondary | ICD-10-CM

## 2015-08-16 DIAGNOSIS — C50911 Malignant neoplasm of unspecified site of right female breast: Secondary | ICD-10-CM

## 2015-08-16 DIAGNOSIS — D649 Anemia, unspecified: Secondary | ICD-10-CM

## 2015-08-16 DIAGNOSIS — D701 Agranulocytosis secondary to cancer chemotherapy: Secondary | ICD-10-CM

## 2015-08-16 DIAGNOSIS — F419 Anxiety disorder, unspecified: Secondary | ICD-10-CM

## 2015-08-16 DIAGNOSIS — C50511 Malignant neoplasm of lower-outer quadrant of right female breast: Secondary | ICD-10-CM

## 2015-08-16 LAB — BASIC METABOLIC PANEL
ANION GAP: 4 — AB (ref 5–15)
BUN: 12 mg/dL (ref 6–20)
CALCIUM: 8.5 mg/dL — AB (ref 8.9–10.3)
CO2: 29 mmol/L (ref 22–32)
Chloride: 102 mmol/L (ref 101–111)
Creatinine, Ser: 0.48 mg/dL (ref 0.44–1.00)
GFR calc non Af Amer: >60 mL/min (ref >60)
Glucose, Bld: 119 mg/dL — ABNORMAL HIGH (ref 65–99)
Potassium: 3.6 mmol/L (ref 3.5–5.1)
Sodium: 135 mmol/L (ref 135–145)

## 2015-08-16 LAB — CBC WITH DIFFERENTIAL/PLATELET
BASOS ABS: 0 10*3/uL (ref 0–0.1)
BASOS PCT: 2 %
Eosinophils Absolute: 0.4 10*3/uL (ref 0–0.7)
Eosinophils Relative: 19 %
HEMATOCRIT: 31.1 % — AB (ref 35.0–47.0)
Hemoglobin: 10.6 g/dL — ABNORMAL LOW (ref 12.0–16.0)
Lymphocytes Relative: 22 %
Lymphs Abs: 0.5 10*3/uL — ABNORMAL LOW (ref 1.0–3.6)
MCH: 35.9 pg — ABNORMAL HIGH (ref 26.0–34.0)
MCHC: 34.2 g/dL (ref 32.0–36.0)
MCV: 105.1 fL — ABNORMAL HIGH (ref 80.0–100.0)
MONO ABS: 0.2 10*3/uL (ref 0.2–0.9)
Monocytes Relative: 7 %
NEUTROS ABS: 1.1 10*3/uL — AB (ref 1.4–6.5)
NEUTROS PCT: 50 %
PLATELETS: 144 10*3/uL — AB (ref 150–440)
RBC: 2.96 MIL/uL — ABNORMAL LOW (ref 3.80–5.20)
RDW: 19 % — AB (ref 11.5–14.5)
WBC: 2.2 10*3/uL — ABNORMAL LOW (ref 3.6–11.0)

## 2015-08-16 MED ORDER — HEPARIN SOD (PORK) LOCK FLUSH 100 UNIT/ML IV SOLN
INTRAVENOUS | Status: AC
  Filled 2015-08-16: qty 5

## 2015-08-16 MED ORDER — SODIUM CHLORIDE 0.9 % IV SOLN
Freq: Once | INTRAVENOUS | Status: AC
  Administered 2015-08-16: 16:00:00 via INTRAVENOUS
  Filled 2015-08-16: qty 4

## 2015-08-16 MED ORDER — PACLITAXEL CHEMO INJECTION 300 MG/50ML
72.0000 mg/m2 | Freq: Once | INTRAVENOUS | Status: AC
  Administered 2015-08-16: 126 mg via INTRAVENOUS
  Filled 2015-08-16: qty 21

## 2015-08-16 MED ORDER — HEPARIN SOD (PORK) LOCK FLUSH 100 UNIT/ML IV SOLN
500.0000 [IU] | Freq: Once | INTRAVENOUS | Status: AC | PRN
  Administered 2015-08-16: 500 [IU]

## 2015-08-16 MED ORDER — DIPHENHYDRAMINE HCL 50 MG/ML IJ SOLN
12.5000 mg | Freq: Once | INTRAMUSCULAR | Status: AC
  Administered 2015-08-16: 12.5 mg via INTRAVENOUS
  Filled 2015-08-16: qty 1

## 2015-08-16 MED ORDER — FAMOTIDINE IN NACL 20-0.9 MG/50ML-% IV SOLN
20.0000 mg | Freq: Once | INTRAVENOUS | Status: AC
  Administered 2015-08-16: 20 mg via INTRAVENOUS
  Filled 2015-08-16: qty 50

## 2015-08-16 MED ORDER — SODIUM CHLORIDE 0.9 % IV SOLN
Freq: Once | INTRAVENOUS | Status: AC
  Administered 2015-08-16: 15:00:00 via INTRAVENOUS
  Filled 2015-08-16: qty 1000

## 2015-08-16 NOTE — Progress Notes (Signed)
Patient has been having neuropathy in her fingers but now feeling neuropathy in her toes.

## 2015-08-23 ENCOUNTER — Inpatient Hospital Stay: Payer: BLUE CROSS/BLUE SHIELD

## 2015-08-23 ENCOUNTER — Inpatient Hospital Stay (HOSPITAL_BASED_OUTPATIENT_CLINIC_OR_DEPARTMENT_OTHER): Payer: BLUE CROSS/BLUE SHIELD | Admitting: Oncology

## 2015-08-23 VITALS — BP 108/74 | Temp 96.6°F | Resp 97 | Wt 148.1 lb

## 2015-08-23 DIAGNOSIS — D649 Anemia, unspecified: Secondary | ICD-10-CM | POA: Diagnosis not present

## 2015-08-23 DIAGNOSIS — R110 Nausea: Secondary | ICD-10-CM

## 2015-08-23 DIAGNOSIS — F419 Anxiety disorder, unspecified: Secondary | ICD-10-CM

## 2015-08-23 DIAGNOSIS — G629 Polyneuropathy, unspecified: Secondary | ICD-10-CM

## 2015-08-23 DIAGNOSIS — Z171 Estrogen receptor negative status [ER-]: Secondary | ICD-10-CM

## 2015-08-23 DIAGNOSIS — D701 Agranulocytosis secondary to cancer chemotherapy: Secondary | ICD-10-CM | POA: Diagnosis not present

## 2015-08-23 DIAGNOSIS — C50511 Malignant neoplasm of lower-outer quadrant of right female breast: Secondary | ICD-10-CM

## 2015-08-23 DIAGNOSIS — Z79899 Other long term (current) drug therapy: Secondary | ICD-10-CM

## 2015-08-23 DIAGNOSIS — C50911 Malignant neoplasm of unspecified site of right female breast: Secondary | ICD-10-CM

## 2015-08-23 LAB — CBC WITH DIFFERENTIAL/PLATELET
BASOS ABS: 0 10*3/uL (ref 0–0.1)
BASOS PCT: 2 %
EOS PCT: 8 %
Eosinophils Absolute: 0.1 10*3/uL (ref 0–0.7)
HCT: 30.6 % — ABNORMAL LOW (ref 35.0–47.0)
Hemoglobin: 10.3 g/dL — ABNORMAL LOW (ref 12.0–16.0)
Lymphocytes Relative: 25 %
Lymphs Abs: 0.4 10*3/uL — ABNORMAL LOW (ref 1.0–3.6)
MCH: 35.5 pg — ABNORMAL HIGH (ref 26.0–34.0)
MCHC: 33.6 g/dL (ref 32.0–36.0)
MCV: 105.6 fL — AB (ref 80.0–100.0)
MONO ABS: 0.1 10*3/uL — AB (ref 0.2–0.9)
Monocytes Relative: 8 %
Neutro Abs: 1 10*3/uL — ABNORMAL LOW (ref 1.4–6.5)
Neutrophils Relative %: 57 %
PLATELETS: 135 10*3/uL — AB (ref 150–440)
RBC: 2.9 MIL/uL — ABNORMAL LOW (ref 3.80–5.20)
RDW: 18.6 % — AB (ref 11.5–14.5)
WBC: 1.8 10*3/uL — ABNORMAL LOW (ref 3.6–11.0)

## 2015-08-23 LAB — BASIC METABOLIC PANEL
ANION GAP: 6 (ref 5–15)
BUN: 10 mg/dL (ref 6–20)
CALCIUM: 8.4 mg/dL — AB (ref 8.9–10.3)
CO2: 28 mmol/L (ref 22–32)
CREATININE: 0.38 mg/dL — AB (ref 0.44–1.00)
Chloride: 103 mmol/L (ref 101–111)
GLUCOSE: 138 mg/dL — AB (ref 65–99)
Potassium: 3.4 mmol/L — ABNORMAL LOW (ref 3.5–5.1)
Sodium: 137 mmol/L (ref 135–145)

## 2015-08-23 MED ORDER — HEPARIN SOD (PORK) LOCK FLUSH 100 UNIT/ML IV SOLN
500.0000 [IU] | Freq: Once | INTRAVENOUS | Status: AC | PRN
  Administered 2015-08-23: 500 [IU]

## 2015-08-23 MED ORDER — DEXAMETHASONE SODIUM PHOSPHATE 100 MG/10ML IJ SOLN
Freq: Once | INTRAMUSCULAR | Status: AC
  Administered 2015-08-23: 16:00:00 via INTRAVENOUS
  Filled 2015-08-23: qty 4

## 2015-08-23 MED ORDER — SODIUM CHLORIDE 0.9 % IJ SOLN
10.0000 mL | INTRAMUSCULAR | Status: DC | PRN
  Administered 2015-08-23: 10 mL
  Filled 2015-08-23: qty 10

## 2015-08-23 MED ORDER — PACLITAXEL CHEMO INJECTION 300 MG/50ML
72.0000 mg/m2 | Freq: Once | INTRAVENOUS | Status: AC
  Administered 2015-08-23: 126 mg via INTRAVENOUS
  Filled 2015-08-23: qty 21

## 2015-08-23 MED ORDER — SODIUM CHLORIDE 0.9 % IV SOLN
Freq: Once | INTRAVENOUS | Status: AC
  Administered 2015-08-23: 16:00:00 via INTRAVENOUS
  Filled 2015-08-23: qty 1000

## 2015-08-23 MED ORDER — HEPARIN SOD (PORK) LOCK FLUSH 100 UNIT/ML IV SOLN
INTRAVENOUS | Status: AC
  Filled 2015-08-23: qty 5

## 2015-08-23 MED ORDER — FAMOTIDINE IN NACL 20-0.9 MG/50ML-% IV SOLN
20.0000 mg | Freq: Once | INTRAVENOUS | Status: AC
  Administered 2015-08-23: 20 mg via INTRAVENOUS
  Filled 2015-08-23: qty 50

## 2015-08-23 MED ORDER — DIPHENHYDRAMINE HCL 50 MG/ML IJ SOLN
12.5000 mg | Freq: Once | INTRAMUSCULAR | Status: AC
  Administered 2015-08-23: 12.5 mg via INTRAVENOUS
  Filled 2015-08-23: qty 1

## 2015-08-23 NOTE — Progress Notes (Signed)
[place]  Telephone:(336) [phone] Fax:(336) [phone]  ID: Wanda Lopez OB: 07-02-1961  MR#: [id number]  KWI#:[id number]  Patient Care Team: Maceo Pro, MD as PCP - General (Obstetrics and Gynecology) Gae Dry, MD as Referring Physician (Obstetrics and Gynecology) Robert Bellow, MD (General Surgery)  CHIEF COMPLAINT:  Chief Complaint  Patient presents with  . Breast Cancer    INTERVAL HISTORY: Patient returns to clinic today for further evaluation and consideration of cycle 10 of 12 of weekly Taxol. She continues to have a mild peripheral neuropathy as well as cracking of her fingernails, but otherwise feels well.  She does not complain of weakness or fatigue today. She does not complain of dizziness today.  She has no other neurologic complaints. She denies any recent fevers. She denies any chest pain or shortness of breath. She denies any vomiting, constipation, or diarrhea. She has no urinary complaints. Patient offers no further specific complaints today.  REVIEW OF SYSTEMS:   Review of Systems  Constitutional: Negative for fever and malaise/fatigue.  Neurological: Negative for weakness.  Endo/Heme/Allergies: Does not bruise/bleed easily.  Psychiatric/Behavioral: The patient is not nervous/anxious.     As per HPI. Otherwise, a complete review of systems is negatve.  PAST MEDICAL HISTORY: Past Medical History  Diagnosis Date  . 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 Wide excision, sentinel node biopsy.  . Breast cancer   . Dehydration 05/28/2015    PAST SURGICAL HISTORY: Past Surgical History  Procedure Laterality Date  . Colonoscopy  2013    Dr Vira Agar  . Breast biopsy Right 02-09-15    wide excision/sentinel node biopsy.  . Portacath placement  03-29-15    Dr Bary Castilla    FAMILY HISTORY Family History  Problem Relation Age of Onset  . Hypertension  Mother   . Hypertension Father   . Diabetes Father   . Heart failure Mother        ADVANCED DIRECTIVES:    HEALTH MAINTENANCE: Social History  Substance Use Topics  . Smoking status: Never Smoker   . Smokeless tobacco: Never Used  . Alcohol Use: No     Colonoscopy:  PAP:  Bone density:  Lipid panel:  No Known Allergies  Current Outpatient Prescriptions  Medication Sig Dispense Refill  . acetaminophen (TYLENOL) 325 MG tablet Take 650 mg by mouth every 6 (six) hours as needed.    . DiphenhydrAMINE HCl, Sleep, (UNISOM SLEEPGELS PO) Take by mouth as needed.    . lidocaine-prilocaine (EMLA) cream Apply to port and cover with saran wrap 1-2 hours before chemotherapy 30 g 2  . loratadine (CLARITIN) 10 MG tablet Take 10 mg by mouth daily as needed for allergies.    Marland Kitchen prochlorperazine (COMPAZINE) 10 MG tablet Take 1 tablet by mouth 3 (three) times daily as needed.  0   No current facility-administered medications for this visit.   Facility-Administered Medications Ordered in Other Visits  Medication Dose Route Frequency Provider Last Rate Last Dose  . sodium chloride 0.9 % injection 10 mL  10 mL Intracatheter PRN Lloyd Huger, MD   10 mL at 06/13/15 1403  . sodium chloride 0.9 % injection 10 mL  10 mL Intracatheter PRN Lloyd Huger, MD   10 mL at 08/23/15 1352    OBJECTIVE: Filed Vitals:   08/23/15 1414  BP: 108/74  Temp: 96.6 F (35.9 C)  Resp: 97  Body mass index is 25.42 kg/(m^2).    ECOG FS:0 - Asymptomatic  General: Well-developed, well-nourished, no acute distress. Eyes: Anicteric sclera. Breasts: Exam deferred today. Lungs: Clear to auscultation bilaterally. Heart: Regular rate and rhythm. No rubs, murmurs, or gallops. Abdomen: Soft, nontender, nondistended. No organomegaly noted, normoactive bowel sounds. Musculoskeletal: No edema, cyanosis, or clubbing. Neuro: Alert, answering all questions appropriately. Cranial nerves grossly intact. Skin: No  rashes or petechiae noted. Discoloration and cracking fingernails, no erythema or infection noted. Psych: Normal affect.   LAB RESULTS:  Lab Results  Component Value Date   NA 137 08/23/2015   K 3.4* 08/23/2015   CL 103 08/23/2015   CO2 28 08/23/2015   GLUCOSE 138* 08/23/2015   BUN 10 08/23/2015   CREATININE 0.38* 08/23/2015   CALCIUM 8.4* 08/23/2015   PROT 6.5 06/13/2015   ALBUMIN 3.2* 06/13/2015   AST 42* 06/13/2015   ALT 24 06/13/2015   ALKPHOS 68 06/13/2015   BILITOT 0.6 06/13/2015   GFRNONAA >60 08/23/2015   GFRAA >60 08/23/2015    Lab Results  Component Value Date   WBC 1.8* 08/23/2015   NEUTROABS 1.0* 08/23/2015   HGB 10.3* 08/23/2015   HCT 30.6* 08/23/2015   MCV 105.6* 08/23/2015   PLT 135* 08/23/2015     STUDIES: No results found.  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  PLAN:    1. Breast cancer: Given a triple negative status of her disease patient will definitely benefit from adjuvant chemotherapy. Patient has completed her MammoSite therapy and has tolerated it well. Pretreatment MUGA revealed an EF of 67%.  She has also completed 4 cycles of Adriamycin and Cytoxan. Proceed with cycle 10 of 12 of weekly Taxol today. Continue with dose reduced Taxol 10%.  Return to clinic in 1 week for consideration of cycle 11.  2. Nausea: Continue Compazine as prescribed. 3. Anxiety: Monitor. 4. Neutropenia: We will proceed with his reduced treatment today, patient will likely require next week off of treatment. 5. Anemia: Hemoglobin essentially unchanged.  Secondary to chemotherapy, monitor.  6. Thrombocytopenia: Monitor, dose reduction as above. 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  Patient expressed understanding and was in agreement with this plan. She also understands that She can call clinic at any time with any questions, concerns, or complaints.   Breast cancer   Staging form:  Breast, AJCC 7th Edition     Clinical stage from 03/26/2015: Stage IA (T1c, N0, M0) - Signed by Lloyd Huger, MD on 03/26/2015   Lloyd Huger, MD   08/23/2015 3:07 PM

## 2015-08-23 NOTE — Progress Notes (Signed)
[place]  Telephone:(336) [phone] Fax:(336) [phone]  ID: Wanda Lopez OB: Dec 09, 1960  MR#: [id number]  FYB#:[id number]  Patient Care Team: Maceo Pro, MD as PCP - General (Obstetrics and Gynecology) Gae Dry, MD as Referring Physician (Obstetrics and Gynecology) Robert Bellow, MD (General Surgery)  CHIEF COMPLAINT:  Chief Complaint  Patient presents with  . Breast Cancer  . Chemotherapy    INTERVAL HISTORY: Patient returns to clinic today for further evaluation and consideration of cycle 9 of 12 of weekly Taxol. She does not complain of weakness or fatigue today. She does not complain of dizziness today.  She has no neurologic complaints. She denies any recent fevers. She denies any chest pain or shortness of breath. She denies any vomiting, constipation, or diarrhea. She has no urinary complaints. Patient offers no specific complaints today.  REVIEW OF SYSTEMS:   Review of Systems  Constitutional: Negative for fever and malaise/fatigue.  Neurological: Negative for weakness.  Endo/Heme/Allergies: Does not bruise/bleed easily.  Psychiatric/Behavioral: The patient is not nervous/anxious.     As per HPI. Otherwise, a complete review of systems is negatve.  PAST MEDICAL HISTORY: Past Medical History  Diagnosis Date  . 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 Wide excision, sentinel node biopsy.  . Breast cancer   . Dehydration 05/28/2015    PAST SURGICAL HISTORY: Past Surgical History  Procedure Laterality Date  . Colonoscopy  2013    Dr Vira Agar  . Breast biopsy Right 02-09-15    wide excision/sentinel node biopsy.  . Portacath placement  03-29-15    Dr Bary Castilla    FAMILY HISTORY Family History  Problem Relation Age of Onset  . Hypertension Mother   . Hypertension Father   . Diabetes Father   . Heart failure Mother        ADVANCED DIRECTIVES:     HEALTH MAINTENANCE: Social History  Substance Use Topics  . Smoking status: Never Smoker   . Smokeless tobacco: Never Used  . Alcohol Use: No     Colonoscopy:  PAP:  Bone density:  Lipid panel:  No Known Allergies  Current Outpatient Prescriptions  Medication Sig Dispense Refill  . acetaminophen (TYLENOL) 325 MG tablet Take 650 mg by mouth every 6 (six) hours as needed.    . DiphenhydrAMINE HCl, Sleep, (UNISOM SLEEPGELS PO) Take by mouth as needed.    . lidocaine-prilocaine (EMLA) cream Apply to port and cover with saran wrap 1-2 hours before chemotherapy 30 g 2  . loratadine (CLARITIN) 10 MG tablet Take 10 mg by mouth daily as needed for allergies.    Marland Kitchen prochlorperazine (COMPAZINE) 10 MG tablet Take 1 tablet by mouth 3 (three) times daily as needed.  0   No current facility-administered medications for this visit.   Facility-Administered Medications Ordered in Other Visits  Medication Dose Route Frequency Provider Last Rate Last Dose  . sodium chloride 0.9 % injection 10 mL  10 mL Intracatheter PRN Lloyd Huger, MD   10 mL at 06/13/15 1403  . sodium chloride 0.9 % injection 10 mL  10 mL Intracatheter PRN Lloyd Huger, MD   10 mL at 08/23/15 1352    OBJECTIVE: Filed Vitals:   08/16/15 1420  BP: 109/71  Pulse: 96  Temp: 98.1 F (36.7 C)  Resp: 18     Body mass index is 24.59 kg/(m^2).    ECOG FS:0 - Asymptomatic  General: Well-developed,  well-nourished, no acute distress. Eyes: Anicteric sclera. Breasts: Exam deferred today. Lungs: Clear to auscultation bilaterally. Heart: Regular rate and rhythm. No rubs, murmurs, or gallops. Abdomen: Soft, nontender, nondistended. No organomegaly noted, normoactive bowel sounds. Musculoskeletal: No edema, cyanosis, or clubbing. Neuro: Alert, answering all questions appropriately. Cranial nerves grossly intact. Skin: No rashes or petechiae noted. Psych: Normal affect.   LAB RESULTS:  Lab Results  Component  Value Date   NA 137 08/23/2015   K 3.4* 08/23/2015   CL 103 08/23/2015   CO2 28 08/23/2015   GLUCOSE 138* 08/23/2015   BUN 10 08/23/2015   CREATININE 0.38* 08/23/2015   CALCIUM 8.4* 08/23/2015   PROT 6.5 06/13/2015   ALBUMIN 3.2* 06/13/2015   AST 42* 06/13/2015   ALT 24 06/13/2015   ALKPHOS 68 06/13/2015   BILITOT 0.6 06/13/2015   GFRNONAA >60 08/23/2015   GFRAA >60 08/23/2015    Lab Results  Component Value Date   WBC 2.2* 08/16/2015   NEUTROABS 1.1* 08/16/2015   HGB 10.6* 08/16/2015   HCT 31.1* 08/16/2015   MCV 105.1* 08/16/2015   PLT 144* 08/16/2015     STUDIES: No results found.  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  PLAN:    1. Breast cancer: Given a triple negative status of her disease patient will definitely benefit from adjuvant chemotherapy. Patient has completed her MammoSite therapy and has tolerated it well. Pretreatment MUGA revealed an EF of 67%.  She has also completed 4 cycles of Adriamycin and Cytoxan. Proceed with cycle 9 of 12 of weekly Taxol today. Continue with dose reduced Taxol 10%.  Return to clinic in 1 week for consideration of cycle 9.  2. Nausea: Continue Compazine as prescribed. 3. Anxiety: Monitor. 4. Neutropenia: Secondary to chemotherapy, monitor.  Dose reduce taxol as above. 5. Anemia: Hemoglobin essentially unchanged.  Secondary to chemotherapy, monitor.  6. Thrombocytopenia: Resolved, dose reduction as above.  Patient expressed understanding and was in agreement with this plan. She also understands that She can call clinic at any time with any questions, concerns, or complaints.   Breast cancer   Staging form: Breast, AJCC 7th Edition     Clinical stage from 03/26/2015: Stage IA (T1c, N0, M0) - Signed by Lloyd Huger, MD on 03/26/2015   Lloyd Huger, MD   08/23/2015 2:21 PM

## 2015-08-30 ENCOUNTER — Inpatient Hospital Stay: Payer: BLUE CROSS/BLUE SHIELD | Admitting: Oncology

## 2015-08-30 ENCOUNTER — Inpatient Hospital Stay: Payer: BLUE CROSS/BLUE SHIELD

## 2015-08-30 ENCOUNTER — Ambulatory Visit: Payer: BLUE CROSS/BLUE SHIELD

## 2015-08-30 ENCOUNTER — Other Ambulatory Visit: Payer: Self-pay | Admitting: Oncology

## 2015-08-30 DIAGNOSIS — C50919 Malignant neoplasm of unspecified site of unspecified female breast: Secondary | ICD-10-CM

## 2015-08-30 MED ORDER — DOXYCYCLINE HYCLATE 100 MG PO TABS: 100.0000 mg | ORAL_TABLET | Freq: Two times a day (BID) | ORAL | Status: DC

## 2015-09-03 ENCOUNTER — Telehealth: Payer: Self-pay | Admitting: *Deleted

## 2015-09-03 ENCOUNTER — Inpatient Hospital Stay: Payer: BLUE CROSS/BLUE SHIELD | Attending: Oncology | Admitting: Oncology

## 2015-09-03 DIAGNOSIS — Z79899 Other long term (current) drug therapy: Secondary | ICD-10-CM | POA: Insufficient documentation

## 2015-09-03 DIAGNOSIS — D649 Anemia, unspecified: Secondary | ICD-10-CM | POA: Diagnosis not present

## 2015-09-03 DIAGNOSIS — R110 Nausea: Secondary | ICD-10-CM | POA: Diagnosis not present

## 2015-09-03 DIAGNOSIS — Z5111 Encounter for antineoplastic chemotherapy: Secondary | ICD-10-CM | POA: Insufficient documentation

## 2015-09-03 DIAGNOSIS — Z171 Estrogen receptor negative status [ER-]: Secondary | ICD-10-CM

## 2015-09-03 DIAGNOSIS — L089 Local infection of the skin and subcutaneous tissue, unspecified: Secondary | ICD-10-CM | POA: Diagnosis not present

## 2015-09-03 DIAGNOSIS — F419 Anxiety disorder, unspecified: Secondary | ICD-10-CM | POA: Diagnosis not present

## 2015-09-03 DIAGNOSIS — D709 Neutropenia, unspecified: Secondary | ICD-10-CM | POA: Diagnosis not present

## 2015-09-03 DIAGNOSIS — G629 Polyneuropathy, unspecified: Secondary | ICD-10-CM | POA: Diagnosis not present

## 2015-09-03 DIAGNOSIS — C50911 Malignant neoplasm of unspecified site of right female breast: Secondary | ICD-10-CM | POA: Diagnosis not present

## 2015-09-03 NOTE — Telephone Encounter (Signed)
abx given on 29th not helping, finger looks like it needs to be drained. After discussing with Dr Grayland Ormond, appt for 1030 given to pt who agrees to come in

## 2015-09-05 ENCOUNTER — Telehealth: Payer: Self-pay | Admitting: *Deleted

## 2015-09-05 NOTE — Telephone Encounter (Signed)
Requesting that Dr Grayland Ormond call her something in for swelling in hands feet and legs

## 2015-09-05 NOTE — Telephone Encounter (Signed)
Per Dr Grayland Ormond he will evaluate her at her appt tomorrow Patient notified.

## 2015-09-06 ENCOUNTER — Inpatient Hospital Stay: Payer: BLUE CROSS/BLUE SHIELD

## 2015-09-06 ENCOUNTER — Inpatient Hospital Stay (HOSPITAL_BASED_OUTPATIENT_CLINIC_OR_DEPARTMENT_OTHER): Payer: BLUE CROSS/BLUE SHIELD | Admitting: Oncology

## 2015-09-06 VITALS — BP 100/68 | HR 97 | Temp 97.0°F | Wt 152.1 lb

## 2015-09-06 DIAGNOSIS — Z709 Sex counseling, unspecified: Secondary | ICD-10-CM

## 2015-09-06 DIAGNOSIS — Z79899 Other long term (current) drug therapy: Secondary | ICD-10-CM

## 2015-09-06 DIAGNOSIS — D649 Anemia, unspecified: Secondary | ICD-10-CM

## 2015-09-06 DIAGNOSIS — L089 Local infection of the skin and subcutaneous tissue, unspecified: Secondary | ICD-10-CM

## 2015-09-06 DIAGNOSIS — F419 Anxiety disorder, unspecified: Secondary | ICD-10-CM

## 2015-09-06 DIAGNOSIS — C50911 Malignant neoplasm of unspecified site of right female breast: Secondary | ICD-10-CM

## 2015-09-06 DIAGNOSIS — Z171 Estrogen receptor negative status [ER-]: Secondary | ICD-10-CM

## 2015-09-06 DIAGNOSIS — R110 Nausea: Secondary | ICD-10-CM

## 2015-09-06 LAB — BASIC METABOLIC PANEL
ANION GAP: 5 (ref 5–15)
BUN: 11 mg/dL (ref 6–20)
CHLORIDE: 103 mmol/L (ref 101–111)
CO2: 28 mmol/L (ref 22–32)
Calcium: 8.4 mg/dL — ABNORMAL LOW (ref 8.9–10.3)
Creatinine, Ser: 0.53 mg/dL (ref 0.44–1.00)
GFR calc non Af Amer: >60 mL/min (ref >60)
Glucose, Bld: 127 mg/dL — ABNORMAL HIGH (ref 65–99)
Potassium: 3.8 mmol/L (ref 3.5–5.1)
SODIUM: 136 mmol/L (ref 135–145)

## 2015-09-06 LAB — CBC WITH DIFFERENTIAL/PLATELET
BASOS PCT: 2 %
Basophils Absolute: 0.1 10*3/uL (ref 0–0.1)
EOS ABS: 0.1 10*3/uL (ref 0–0.7)
Eosinophils Relative: 2 %
HEMATOCRIT: 31.3 % — AB (ref 35.0–47.0)
HEMOGLOBIN: 10.8 g/dL — AB (ref 12.0–16.0)
Lymphocytes Relative: 20 %
Lymphs Abs: 0.5 10*3/uL — ABNORMAL LOW (ref 1.0–3.6)
MCH: 35.8 pg — ABNORMAL HIGH (ref 26.0–34.0)
MCHC: 34.4 g/dL (ref 32.0–36.0)
MCV: 104.1 fL — ABNORMAL HIGH (ref 80.0–100.0)
MONOS PCT: 24 %
Monocytes Absolute: 0.6 10*3/uL (ref 0.2–0.9)
NEUTROS ABS: 1.3 10*3/uL — AB (ref 1.4–6.5)
NEUTROS PCT: 52 %
Platelets: 173 10*3/uL (ref 150–440)
RBC: 3 MIL/uL — AB (ref 3.80–5.20)
RDW: 18.3 % — ABNORMAL HIGH (ref 11.5–14.5)
WBC: 2.6 10*3/uL — AB (ref 3.6–11.0)

## 2015-09-06 MED ORDER — HEPARIN SOD (PORK) LOCK FLUSH 100 UNIT/ML IV SOLN
INTRAVENOUS | Status: AC
  Filled 2015-09-06: qty 5

## 2015-09-06 MED ORDER — SODIUM CHLORIDE 0.9 % IJ SOLN
10.0000 mL | Freq: Once | INTRAMUSCULAR | Status: AC
  Administered 2015-09-06: 10 mL via INTRAVENOUS
  Filled 2015-09-06: qty 10

## 2015-09-06 MED ORDER — FUROSEMIDE 20 MG PO TABS: 20.0000 mg | ORAL_TABLET | Freq: Every day | ORAL | Status: DC

## 2015-09-06 MED ORDER — HEPARIN SOD (PORK) LOCK FLUSH 100 UNIT/ML IV SOLN
500.0000 [IU] | Freq: Once | INTRAVENOUS | Status: AC
  Administered 2015-09-06: 500 [IU] via INTRAVENOUS

## 2015-09-06 NOTE — Progress Notes (Signed)
Patient has bilateral edema in lower extremities for past one week.  Also has had infected middle finger on left hand.

## 2015-09-07 NOTE — Progress Notes (Signed)
[place]  Telephone:(336) [phone] Fax:(336) [phone]  ID: [name] OB: 05/24/1961  MR#: [id number]  PFY#:[id number]  Patient Care Team: Maceo Pro, MD as PCP - General (Obstetrics and Gynecology) Gae Dry, MD as Referring Physician (Obstetrics and Gynecology) Robert Bellow, MD (General Surgery)  CHIEF COMPLAINT:  No chief complaint on file.   INTERVAL HISTORY: Patient returns to clinic today as an add-on with complaints of a swollen and painful finger. Patient states it had been erythematous, but over the last several days has become worse despite treatment with anti-biotics. She otherwise feels well. She denies any fevers.  She does not complain of weakness or fatigue today. She does not complain of dizziness today. She denies any chest pain or shortness of breath. She denies any vomiting, constipation, or diarrhea. She has no urinary complaints. Patient offers no further specific complaints today.  REVIEW OF SYSTEMS:   Review of Systems  Constitutional: Negative for fever and malaise/fatigue.  Neurological: Negative for weakness.  Endo/Heme/Allergies: Does not bruise/bleed easily.  Psychiatric/Behavioral: The patient is not nervous/anxious.     As per HPI. Otherwise, a complete review of systems is negatve.  PAST MEDICAL HISTORY: Past Medical History  Diagnosis Date  . 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 Wide excision, sentinel node biopsy.  . Breast cancer   . Dehydration 05/28/2015    PAST SURGICAL HISTORY: Past Surgical History  Procedure Laterality Date  . Colonoscopy  2013    Dr Vira Agar  . Breast biopsy Right 02-09-15    wide excision/sentinel node biopsy.  . Portacath placement  03-29-15    Dr Bary Castilla    FAMILY HISTORY Family History  Problem Relation Age of Onset  . Hypertension Mother   . Hypertension Father   . Diabetes Father   .  Heart failure Mother        ADVANCED DIRECTIVES:    HEALTH MAINTENANCE: Social History  Substance Use Topics  . Smoking status: Never Smoker   . Smokeless tobacco: Never Used  . Alcohol Use: No     Colonoscopy:  PAP:  Bone density:  Lipid panel:  No Known Allergies  Current Outpatient Prescriptions  Medication Sig Dispense Refill  . acetaminophen (TYLENOL) 325 MG tablet Take 650 mg by mouth every 6 (six) hours as needed.    . DiphenhydrAMINE HCl, Sleep, (UNISOM SLEEPGELS PO) Take by mouth as needed.    . doxycycline (VIBRA-TABS) 100 MG tablet Take 1 tablet (100 mg total) by mouth 2 (two) times daily. (Patient not taking: Reported on 09/06/2015) 14 tablet 1  . furosemide (LASIX) 20 MG tablet Take 1 tablet (20 mg total) by mouth daily. Take 1 or 2 times per day as needed for swelling. 30 tablet 2  . lidocaine-prilocaine (EMLA) cream Apply to port and cover with saran wrap 1-2 hours before chemotherapy 30 g 2  . loratadine (CLARITIN) 10 MG tablet Take 10 mg by mouth daily as needed for allergies.    . naproxen sodium (RA NAPROXEN SODIUM) 220 MG tablet Take by mouth.    . prochlorperazine (COMPAZINE) 10 MG tablet Take 1 tablet by mouth 3 (three) times daily as needed.  0   No current facility-administered medications for this visit.   Facility-Administered Medications Ordered in Other Visits  Medication Dose Route Frequency Provider Last Rate Last Dose  . sodium chloride 0.9 % injection 10 mL  10 mL Intracatheter PRN Christia Reading  Bridget Hartshorn, MD   10 mL at 06/13/15 1403    OBJECTIVE: There were no vitals filed for this visit.   There is no weight on file to calculate BMI.    ECOG FS:0 - Asymptomatic  General: Well-developed, well-nourished, no acute distress. Eyes: Anicteric sclera. Breasts: Exam deferred today. Lungs: Clear to auscultation bilaterally. Heart: Regular rate and rhythm. No rubs, murmurs, or gallops. Abdomen: Soft, nontender, nondistended. No organomegaly noted,  normoactive bowel sounds. Musculoskeletal: No edema, cyanosis, or clubbing. Neuro: Alert, answering all questions appropriately. Cranial nerves grossly intact. Skin: Left middle finger with erythema and blistering. Psych: Normal affect.   LAB RESULTS:  Lab Results  Component Value Date   NA 136 09/06/2015   K 3.8 09/06/2015   CL 103 09/06/2015   CO2 28 09/06/2015   GLUCOSE 127* 09/06/2015   BUN 11 09/06/2015   CREATININE 0.53 09/06/2015   CALCIUM 8.4* 09/06/2015   PROT 6.5 06/13/2015   ALBUMIN 3.2* 06/13/2015   AST 42* 06/13/2015   ALT 24 06/13/2015   ALKPHOS 68 06/13/2015   BILITOT 0.6 06/13/2015   GFRNONAA >60 09/06/2015   GFRAA >60 09/06/2015    Lab Results  Component Value Date   WBC 2.6* 09/06/2015   NEUTROABS 1.3* 09/06/2015   HGB 10.8* 09/06/2015   HCT 31.3* 09/06/2015   MCV 104.1* 09/06/2015   PLT 173 09/06/2015     STUDIES: No results found.  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  PLAN:    1. Breast cancer: Given a triple negative status of her disease patient will definitely benefit from adjuvant chemotherapy. Patient has completed her MammoSite therapy and has tolerated it well. Pretreatment MUGA revealed an EF of 67%.  She has also completed 4 cycles of Adriamycin and Cytoxan. Patient has been instructed to keep her previously scheduled follow-up appointment for cycle 11 later this week.  2. Nausea: Continue Compazine as prescribed. 3. Anxiety: Monitor. 4. Finger infection: Continue doxycycline as ordered and patient was given a referral to surgery for consideration of I&D.  Approximately 30 minutes was spent in discussion and consultation.  Patient expressed understanding and was in agreement with this plan. She also understands that She can call clinic at any time with any questions, concerns, or complaints.   Breast cancer   Staging form: Breast, AJCC 7th Edition     Clinical stage from  03/26/2015: Stage IA (T1c, N0, M0) - Signed by Lloyd Huger, MD on 03/26/2015   Lloyd Huger, MD   09/07/2015 5:12 PM

## 2015-09-07 NOTE — Progress Notes (Signed)
[facility]  Telephone:(336) [phone] Fax:(336) [phone]  ID: [name] OB: 09/24/1961  MR#: [id number]  LKT#:[id number]  Patient Care Team: Maceo Pro, MD as PCP - General (Obstetrics and Gynecology) Gae Dry, MD as Referring Physician (Obstetrics and Gynecology) Robert Bellow, MD (General Surgery)  CHIEF COMPLAINT:  Chief Complaint  Patient presents with  . Follow-up    INTERVAL HISTORY: Patient returns to clinic today for further evaluation and consideration of cycle 11 of 12 of weekly Taxol. She saw surgery earlier this week with an I&D of her finger with improvement of her infection. She continues to have a mild peripheral neuropathy as well as cracking of her fingernails, but otherwise feels well.  She does not complain of weakness or fatigue today. She does not complain of dizziness today.  She has no other neurologic complaints. She denies any recent fevers. She denies any chest pain or shortness of breath. She denies any vomiting, constipation, or diarrhea. She has no urinary complaints. Patient offers no further specific complaints today.  REVIEW OF SYSTEMS:   Review of Systems  Constitutional: Negative for fever and malaise/fatigue.  Neurological: Negative for weakness.  Endo/Heme/Allergies: Does not bruise/bleed easily.  Psychiatric/Behavioral: The patient is not nervous/anxious.     As per HPI. Otherwise, a complete review of systems is negatve.  PAST MEDICAL HISTORY: Past Medical History  Diagnosis Date  . 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 Wide excision, sentinel node biopsy.  . Breast cancer   . Dehydration 05/28/2015    PAST SURGICAL HISTORY: Past Surgical History  Procedure Laterality Date  . Colonoscopy  2013    Dr Vira Agar  . Breast biopsy Right 02-09-15    wide excision/sentinel node biopsy.  . Portacath placement  03-29-15    Dr  Bary Castilla    FAMILY HISTORY Family History  Problem Relation Age of Onset  . Hypertension Mother   . Hypertension Father   . Diabetes Father   . Heart failure Mother        ADVANCED DIRECTIVES:    HEALTH MAINTENANCE: Social History  Substance Use Topics  . Smoking status: Never Smoker   . Smokeless tobacco: Never Used  . Alcohol Use: No     Colonoscopy:  PAP:  Bone density:  Lipid panel:  No Known Allergies  Current Outpatient Prescriptions  Medication Sig Dispense Refill  . acetaminophen (TYLENOL) 325 MG tablet Take 650 mg by mouth every 6 (six) hours as needed.    . lidocaine-prilocaine (EMLA) cream Apply to port and cover with saran wrap 1-2 hours before chemotherapy 30 g 2  . DiphenhydrAMINE HCl, Sleep, (UNISOM SLEEPGELS PO) Take by mouth as needed.    . doxycycline (VIBRA-TABS) 100 MG tablet Take 1 tablet (100 mg total) by mouth 2 (two) times daily. (Patient not taking: Reported on 09/06/2015) 14 tablet 1  . furosemide (LASIX) 20 MG tablet Take 1 tablet (20 mg total) by mouth daily. Take 1 or 2 times per day as needed for swelling. 30 tablet 2  . loratadine (CLARITIN) 10 MG tablet Take 10 mg by mouth daily as needed for allergies.    . naproxen sodium (RA NAPROXEN SODIUM) 220 MG tablet Take by mouth.    . prochlorperazine (COMPAZINE) 10 MG tablet Take 1 tablet by mouth 3 (three) times daily as needed.  0   No current facility-administered medications for this visit.   Facility-Administered Medications Ordered  in Other Visits  Medication Dose Route Frequency Provider Last Rate Last Dose  . sodium chloride 0.9 % injection 10 mL  10 mL Intracatheter PRN Lloyd Huger, MD   10 mL at 06/13/15 1403    OBJECTIVE: Filed Vitals:   09/06/15 1344  BP: 100/68  Pulse: 97  Temp: 97 F (36.1 C)     Body mass index is 26.1 kg/(m^2).    ECOG FS:0 - Asymptomatic  General: Well-developed, well-nourished, no acute distress. Eyes: Anicteric sclera. Breasts: Exam deferred  today. Lungs: Clear to auscultation bilaterally. Heart: Regular rate and rhythm. No rubs, murmurs, or gallops. Abdomen: Soft, nontender, nondistended. No organomegaly noted, normoactive bowel sounds. Musculoskeletal: No edema, cyanosis, or clubbing. Neuro: Alert, answering all questions appropriately. Cranial nerves grossly intact. Skin: No rashes or petechiae noted. Discoloration and cracking fingernails, no erythema or infection noted. Left middle finger with surgical dressing. Psych: Normal affect.   LAB RESULTS:  Lab Results  Component Value Date   NA 136 09/06/2015   K 3.8 09/06/2015   CL 103 09/06/2015   CO2 28 09/06/2015   GLUCOSE 127* 09/06/2015   BUN 11 09/06/2015   CREATININE 0.53 09/06/2015   CALCIUM 8.4* 09/06/2015   PROT 6.5 06/13/2015   ALBUMIN 3.2* 06/13/2015   AST 42* 06/13/2015   ALT 24 06/13/2015   ALKPHOS 68 06/13/2015   BILITOT 0.6 06/13/2015   GFRNONAA >60 09/06/2015   GFRAA >60 09/06/2015    Lab Results  Component Value Date   WBC 2.6* 09/06/2015   NEUTROABS 1.3* 09/06/2015   HGB 10.8* 09/06/2015   HCT 31.3* 09/06/2015   MCV 104.1* 09/06/2015   PLT 173 09/06/2015     STUDIES: No results found.  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  PLAN:    1. Breast cancer: Given a triple negative status of her disease patient will definitely benefit from adjuvant chemotherapy. Patient has completed her MammoSite therapy and has tolerated it well. Pretreatment MUGA revealed an EF of 67%.  She has also completed 4 cycles of Adriamycin and Cytoxan. Proceed with cycle 11 of 12 of weekly Taxol today. Continue with dose reduced Taxol 10%.  Return to clinic in 1 week for consideration of cycle 12.  2. Nausea: Continue Compazine as prescribed. 3. Anxiety: Monitor. 4. Neutropenia: Will proceed with dose reduced treatment today. 5. Anemia: Hemoglobin essentially unchanged.  Secondary to chemotherapy,  monitor.  6. Thrombocytopenia: Resolved. 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 8. Finger infection appreciate surgical input. Patient was instructed to continue doxycycline for 1 more week for a total of 14 days.  Patient expressed understanding and was in agreement with this plan. She also understands that She can call clinic at any time with any questions, concerns, or complaints.   Breast cancer   Staging form: Breast, AJCC 7th Edition     Clinical stage from 03/26/2015: Stage IA (T1c, N0, M0) - Signed by Lloyd Huger, MD on 03/26/2015   Lloyd Huger, MD   09/07/2015 5:17 PM

## 2015-09-10 ENCOUNTER — Inpatient Hospital Stay: Payer: BLUE CROSS/BLUE SHIELD

## 2015-09-10 VITALS — BP 102/66 | HR 93 | Temp 97.1°F | Resp 18

## 2015-09-10 DIAGNOSIS — C50911 Malignant neoplasm of unspecified site of right female breast: Secondary | ICD-10-CM

## 2015-09-10 MED ORDER — SODIUM CHLORIDE 0.9 % IV SOLN
Freq: Once | INTRAVENOUS | Status: AC
  Administered 2015-09-10: 14:00:00 via INTRAVENOUS
  Filled 2015-09-10: qty 4

## 2015-09-10 MED ORDER — HEPARIN SOD (PORK) LOCK FLUSH 100 UNIT/ML IV SOLN
500.0000 [IU] | Freq: Once | INTRAVENOUS | Status: AC | PRN
  Administered 2015-09-10: 500 [IU]

## 2015-09-10 MED ORDER — SODIUM CHLORIDE 0.9 % IJ SOLN
10.0000 mL | INTRAMUSCULAR | Status: DC | PRN
  Administered 2015-09-10: 10 mL
  Filled 2015-09-10: qty 10

## 2015-09-10 MED ORDER — SODIUM CHLORIDE 0.9 % IV SOLN
Freq: Once | INTRAVENOUS | Status: AC
  Administered 2015-09-10: 14:00:00 via INTRAVENOUS
  Filled 2015-09-10: qty 1000

## 2015-09-10 MED ORDER — DIPHENHYDRAMINE HCL 50 MG/ML IJ SOLN
12.5000 mg | Freq: Once | INTRAMUSCULAR | Status: AC
  Administered 2015-09-10: 12.5 mg via INTRAVENOUS
  Filled 2015-09-10: qty 1

## 2015-09-10 MED ORDER — FAMOTIDINE IN NACL 20-0.9 MG/50ML-% IV SOLN
20.0000 mg | Freq: Once | INTRAVENOUS | Status: AC
  Administered 2015-09-10: 20 mg via INTRAVENOUS
  Filled 2015-09-10: qty 50

## 2015-09-10 MED ORDER — PACLITAXEL CHEMO INJECTION 300 MG/50ML
72.0000 mg/m2 | Freq: Once | INTRAVENOUS | Status: AC
  Administered 2015-09-10: 126 mg via INTRAVENOUS
  Filled 2015-09-10: qty 21

## 2015-09-10 MED ORDER — HEPARIN SOD (PORK) LOCK FLUSH 100 UNIT/ML IV SOLN
INTRAVENOUS | Status: AC
  Filled 2015-09-10: qty 5

## 2015-09-10 NOTE — Progress Notes (Signed)
No labs to be drawn today per MD, Dr. Grayland Ormond, order.

## 2015-09-11 NOTE — Progress Notes (Unsigned)
Card sent for 3 month follow up.

## 2015-09-13 ENCOUNTER — Other Ambulatory Visit: Payer: BLUE CROSS/BLUE SHIELD

## 2015-09-13 ENCOUNTER — Ambulatory Visit: Payer: BLUE CROSS/BLUE SHIELD | Admitting: Oncology

## 2015-09-13 ENCOUNTER — Ambulatory Visit: Payer: BLUE CROSS/BLUE SHIELD

## 2015-09-17 ENCOUNTER — Inpatient Hospital Stay (HOSPITAL_BASED_OUTPATIENT_CLINIC_OR_DEPARTMENT_OTHER): Payer: BLUE CROSS/BLUE SHIELD | Admitting: Oncology

## 2015-09-17 ENCOUNTER — Inpatient Hospital Stay: Payer: BLUE CROSS/BLUE SHIELD

## 2015-09-17 VITALS — BP 109/71 | HR 99 | Temp 98.3°F | Resp 20 | Wt 145.9 lb

## 2015-09-17 DIAGNOSIS — R110 Nausea: Secondary | ICD-10-CM

## 2015-09-17 DIAGNOSIS — Z171 Estrogen receptor negative status [ER-]: Secondary | ICD-10-CM | POA: Diagnosis not present

## 2015-09-17 DIAGNOSIS — D649 Anemia, unspecified: Secondary | ICD-10-CM

## 2015-09-17 DIAGNOSIS — C50911 Malignant neoplasm of unspecified site of right female breast: Secondary | ICD-10-CM

## 2015-09-17 DIAGNOSIS — Z79899 Other long term (current) drug therapy: Secondary | ICD-10-CM

## 2015-09-17 DIAGNOSIS — G629 Polyneuropathy, unspecified: Secondary | ICD-10-CM

## 2015-09-17 LAB — CBC WITH DIFFERENTIAL/PLATELET
Basophils Absolute: 0.1 10*3/uL (ref 0–0.1)
Basophils Relative: 3 %
EOS ABS: 0.5 10*3/uL (ref 0–0.7)
Eosinophils Relative: 14 %
HEMATOCRIT: 31.9 % — AB (ref 35.0–47.0)
HEMOGLOBIN: 10.8 g/dL — AB (ref 12.0–16.0)
LYMPHS ABS: 0.6 10*3/uL — AB (ref 1.0–3.6)
Lymphocytes Relative: 18 %
MCH: 35.1 pg — AB (ref 26.0–34.0)
MCHC: 33.8 g/dL (ref 32.0–36.0)
MCV: 103.6 fL — ABNORMAL HIGH (ref 80.0–100.0)
Monocytes Absolute: 0.2 10*3/uL (ref 0.2–0.9)
Monocytes Relative: 5 %
NEUTROS ABS: 2.2 10*3/uL (ref 1.4–6.5)
NEUTROS PCT: 60 %
Platelets: 160 10*3/uL (ref 150–440)
RBC: 3.08 MIL/uL — ABNORMAL LOW (ref 3.80–5.20)
RDW: 17.5 % — ABNORMAL HIGH (ref 11.5–14.5)
WBC: 3.7 10*3/uL (ref 3.6–11.0)

## 2015-09-17 LAB — BASIC METABOLIC PANEL
Anion gap: 7 (ref 5–15)
BUN: 16 mg/dL (ref 6–20)
CHLORIDE: 99 mmol/L — AB (ref 101–111)
CO2: 29 mmol/L (ref 22–32)
CREATININE: 0.46 mg/dL (ref 0.44–1.00)
Calcium: 8 mg/dL — ABNORMAL LOW (ref 8.9–10.3)
GFR calc Af Amer: >60 mL/min (ref >60)
GFR calc non Af Amer: >60 mL/min (ref >60)
GLUCOSE: 145 mg/dL — AB (ref 65–99)
Potassium: 3.4 mmol/L — ABNORMAL LOW (ref 3.5–5.1)
Sodium: 135 mmol/L (ref 135–145)

## 2015-09-17 MED ORDER — SODIUM CHLORIDE 0.9 % IV SOLN
Freq: Once | INTRAVENOUS | Status: AC
  Administered 2015-09-17: 15:00:00 via INTRAVENOUS
  Filled 2015-09-17: qty 4

## 2015-09-17 MED ORDER — SODIUM CHLORIDE 0.9 % IV SOLN
Freq: Once | INTRAVENOUS | Status: AC
  Administered 2015-09-17: 15:00:00 via INTRAVENOUS
  Filled 2015-09-17: qty 1000

## 2015-09-17 MED ORDER — SODIUM CHLORIDE 0.9 % IJ SOLN
10.0000 mL | INTRAMUSCULAR | Status: DC | PRN
  Administered 2015-09-17: 10 mL via INTRAVENOUS
  Filled 2015-09-17: qty 10

## 2015-09-17 MED ORDER — DIPHENHYDRAMINE HCL 50 MG/ML IJ SOLN
12.5000 mg | Freq: Once | INTRAMUSCULAR | Status: AC
  Administered 2015-09-17: 12.5 mg via INTRAVENOUS
  Filled 2015-09-17: qty 1

## 2015-09-17 MED ORDER — PACLITAXEL CHEMO INJECTION 300 MG/50ML
72.0000 mg/m2 | Freq: Once | INTRAVENOUS | Status: AC
  Administered 2015-09-17: 126 mg via INTRAVENOUS
  Filled 2015-09-17: qty 21

## 2015-09-17 MED ORDER — FAMOTIDINE IN NACL 20-0.9 MG/50ML-% IV SOLN
20.0000 mg | Freq: Once | INTRAVENOUS | Status: AC
  Administered 2015-09-17: 20 mg via INTRAVENOUS
  Filled 2015-09-17: qty 50

## 2015-09-17 MED ORDER — HEPARIN SOD (PORK) LOCK FLUSH 100 UNIT/ML IV SOLN
500.0000 [IU] | Freq: Once | INTRAVENOUS | Status: AC
  Administered 2015-09-17: 500 [IU] via INTRAVENOUS
  Filled 2015-09-17: qty 5

## 2015-09-17 NOTE — Progress Notes (Signed)
Patient here today for ongoing follow up and treatment consideration regarding breast cancer. Patient denies any concerns today.  

## 2015-09-29 NOTE — Progress Notes (Signed)
[place]  [phone] [phone]  ID: Wanda Lopez OB: [birth year]  MR#: [id number]  OEV#:[id number]  Patient Care Team: Maceo Pro, MD as PCP - General (Obstetrics and Gynecology) Gae Dry, MD as Referring Physician (Obstetrics and Gynecology) Robert Bellow, MD (General Surgery)  CHIEF COMPLAINT:  Chief Complaint  Patient presents with  . Breast Cancer    follow up    INTERVAL HISTORY: Patient returns to clinic today for further evaluation and consideration of cycle 12 of 12 of weekly Taxol. She continues to have a mild peripheral neuropathy as well as cracking of her fingernails, but otherwise feels well.  She does not complain of weakness or fatigue today. She does not complain of dizziness today.  She has no other neurologic complaints. She denies any recent fevers. She denies any chest pain or shortness of breath. She denies any vomiting, constipation, or diarrhea. She has no urinary complaints. Patient offers no specific complaints today.  REVIEW OF SYSTEMS:   Review of Systems  Constitutional: Negative for fever and malaise/fatigue.  Respiratory: Negative.   Cardiovascular: Negative.   Gastrointestinal: Negative.   Musculoskeletal: Negative.   Neurological: Positive for sensory change. Negative for weakness.  Endo/Heme/Allergies: Does not bruise/bleed easily.  Psychiatric/Behavioral: The patient is not nervous/anxious.     As per HPI. Otherwise, a complete review of systems is negatve.  PAST MEDICAL HISTORY: Past Medical History  Diagnosis Date  . 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 Wide excision, sentinel node biopsy.  . Breast cancer   . Dehydration 05/28/2015    PAST SURGICAL HISTORY: Past Surgical History  Procedure Laterality Date  . Colonoscopy  2013    Dr Vira Agar  . Breast biopsy Right 02-09-15    wide  excision/sentinel node biopsy.  . Portacath placement  03-29-15    Dr Bary Castilla    FAMILY HISTORY Family History  Problem Relation Age of Onset  . Hypertension Mother   . Hypertension Father   . Diabetes Father   . Heart failure Mother        ADVANCED DIRECTIVES:    HEALTH MAINTENANCE: Social History  Substance Use Topics  . Smoking status: Never Smoker   . Smokeless tobacco: Never Used  . Alcohol Use: No     Colonoscopy:  PAP:  Bone density:  Lipid panel:  No Known Allergies  Current Outpatient Prescriptions  Medication Sig Dispense Refill  . acetaminophen (TYLENOL) 325 MG tablet Take 650 mg by mouth every 6 (six) hours as needed.    . DiphenhydrAMINE HCl, Sleep, (UNISOM SLEEPGELS PO) Take by mouth as needed.    . furosemide (LASIX) 20 MG tablet Take 1 tablet (20 mg total) by mouth daily. Take 1 or 2 times per day as needed for swelling. 30 tablet 2  . lidocaine-prilocaine (EMLA) cream Apply to port and cover with saran wrap 1-2 hours before chemotherapy 30 g 2  . loratadine (CLARITIN) 10 MG tablet Take 10 mg by mouth daily as needed for allergies.    . naproxen sodium (RA NAPROXEN SODIUM) 220 MG tablet Take by mouth.    . prochlorperazine (COMPAZINE) 10 MG tablet Take 1 tablet by mouth 3 (three) times daily as needed.  0  . doxycycline (VIBRA-TABS) 100 MG tablet Take 1 tablet (100 mg total) by mouth 2 (two) times daily. (Patient not taking: Reported on 09/17/2015) 14 tablet 1   No current facility-administered medications  for this visit.   Facility-Administered Medications Ordered in Other Visits  Medication Dose Route Frequency Provider Last Rate Last Dose  . sodium chloride 0.9 % injection 10 mL  10 mL Intracatheter PRN Lloyd Huger, MD   10 mL at 06/13/15 1403    OBJECTIVE: Filed Vitals:   09/17/15 1432  BP: 109/71  Pulse: 99  Temp: 98.3 F (36.8 C)  Resp: 20     Body mass index is 25.04 kg/(m^2).    ECOG FS:0 - Asymptomatic  General:  Well-developed, well-nourished, no acute distress. Eyes: Anicteric sclera. Breasts: Exam deferred today. Lungs: Clear to auscultation bilaterally. Heart: Regular rate and rhythm. No rubs, murmurs, or gallops. Abdomen: Soft, nontender, nondistended. No organomegaly noted, normoactive bowel sounds. Musculoskeletal: No edema, cyanosis, or clubbing. Neuro: Alert, answering all questions appropriately. Cranial nerves grossly intact. Skin: No rashes or petechiae noted. Discoloration and cracking fingernails, no erythema or infection noted. Left middle finger with surgical dressing. Psych: Normal affect.   LAB RESULTS:  Lab Results  Component Value Date   NA 135 09/17/2015   K 3.4* 09/17/2015   CL 99* 09/17/2015   CO2 29 09/17/2015   GLUCOSE 145* 09/17/2015   BUN 16 09/17/2015   CREATININE 0.46 09/17/2015   CALCIUM 8.0* 09/17/2015   PROT 6.5 06/13/2015   ALBUMIN 3.2* 06/13/2015   AST 42* 06/13/2015   ALT 24 06/13/2015   ALKPHOS 68 06/13/2015   BILITOT 0.6 06/13/2015   GFRNONAA >60 09/17/2015   GFRAA >60 09/17/2015    Lab Results  Component Value Date   WBC 3.7 09/17/2015   NEUTROABS 2.2 09/17/2015   HGB 10.8* 09/17/2015   HCT 31.9* 09/17/2015   MCV 103.6* 09/17/2015   PLT 160 09/17/2015     STUDIES: No results found.  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  PLAN:    1. Breast cancer: Given a triple negative status of her disease patient will definitely benefit from adjuvant chemotherapy. Patient has completed her MammoSite therapy and has tolerated it well. Pretreatment MUGA revealed an EF of 67%.  She has also completed 4 cycles of Adriamycin and Cytoxan. Proceed with cycle 12 of 12 of weekly Taxol today. Continue with dose reduced Taxol 10%.  She does not require an aromatase inhibitor given the triple negative status of her tumor. Return to clinic in 6 weeks for her survivorship visit and then in 3 months for  routine evaluation.   2. Nausea: Continue Compazine as prescribed. 3. Anxiety: Monitor. 4. Neutropenia: Resolved. 5. Anemia: Hemoglobin essentially unchanged.  Secondary to chemotherapy, monitor.  6. Thrombocytopenia: Resolved. 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 8. Finger infection appreciate surgical input. Resolved.  Patient expressed understanding and was in agreement with this plan. She also understands that She can call clinic at any time with any questions, concerns, or complaints.   Breast cancer   Staging form: Breast, AJCC 7th Edition     Clinical stage from 03/26/2015: Stage IA (T1c, N0, M0) - Signed by Lloyd Huger, MD on 03/26/2015   Lloyd Huger, MD   09/29/2015 9:48 PM

## 2015-10-11 ENCOUNTER — Other Ambulatory Visit: Payer: Self-pay | Admitting: *Deleted

## 2015-10-11 DIAGNOSIS — R600 Localized edema: Secondary | ICD-10-CM

## 2015-10-15 ENCOUNTER — Ambulatory Visit
Admission: RE | Admit: 2015-10-15 | Discharge: 2015-10-15 | Disposition: A | Discharge status: Home / Self Care | Payer: BLUE CROSS/BLUE SHIELD | Source: Ambulatory Visit | Attending: Oncology | Admitting: Oncology

## 2015-10-15 DIAGNOSIS — R600 Localized edema: Secondary | ICD-10-CM

## 2015-10-17 ENCOUNTER — Telehealth: Payer: Self-pay | Admitting: *Deleted

## 2015-10-17 NOTE — Telephone Encounter (Signed)
I thought she was worried about the swelling, not pain?  If she has peripheral neuropathy, we can call in gabapentin to see if it helps, but it will not help her edema.

## 2015-10-17 NOTE — Telephone Encounter (Signed)
Start 300mg  tid.  Thank you.

## 2015-10-17 NOTE — Telephone Encounter (Signed)
Per Dr Grayland Ormond, there is no clot and recommend that she see her PMD for further evaluation and possible referral to cardiologist.  She stated she is "Not happy with that answer" and does not know what her PMD would do for her and that she "will probably just keep suffering with the pain in my feet and hope it will goes away". She said she has needle type pain in her feet adn that her lower legs are red and cannot understand how it is not related to chemotherapy. Then she stated thanks for nothing.

## 2015-10-17 NOTE — Telephone Encounter (Signed)
So do you want to order gabapentin and if so what dose and what directions adn how many?

## 2015-10-18 ENCOUNTER — Other Ambulatory Visit: Payer: Self-pay | Admitting: Oncology

## 2015-10-18 MED ORDER — GABAPENTIN 300 MG PO CAPS: 300.0000 mg | ORAL_CAPSULE | Freq: Three times a day (TID) | ORAL | Status: DC

## 2015-10-30 ENCOUNTER — Telehealth: Payer: Self-pay

## 2015-10-30 ENCOUNTER — Inpatient Hospital Stay: Payer: BLUE CROSS/BLUE SHIELD | Attending: Oncology

## 2015-10-30 ENCOUNTER — Inpatient Hospital Stay: Payer: BLUE CROSS/BLUE SHIELD

## 2015-10-30 DIAGNOSIS — Z171 Estrogen receptor negative status [ER-]: Secondary | ICD-10-CM | POA: Diagnosis not present

## 2015-10-30 DIAGNOSIS — Z452 Encounter for adjustment and management of vascular access device: Secondary | ICD-10-CM | POA: Insufficient documentation

## 2015-10-30 DIAGNOSIS — C50911 Malignant neoplasm of unspecified site of right female breast: Secondary | ICD-10-CM | POA: Diagnosis not present

## 2015-10-30 MED ORDER — SODIUM CHLORIDE 0.9 % IJ SOLN
10.0000 mL | INTRAMUSCULAR | Status: AC | PRN
  Administered 2015-10-30: 10 mL via INTRAVENOUS
  Filled 2015-10-30: qty 10

## 2015-10-30 MED ORDER — HEPARIN SOD (PORK) LOCK FLUSH 100 UNIT/ML IV SOLN
500.0000 [IU] | Freq: Once | INTRAVENOUS | Status: AC
Start: 2015-10-30 — End: 2015-10-30
  Administered 2015-10-30: 500 [IU] via INTRAVENOUS
  Filled 2015-10-30: qty 5

## 2015-10-30 NOTE — Progress Notes (Unsigned)
Survivorship Care Plan visit completed.  Treatment summary reviewed and given to patient.  ASCO answers booklet reviewed and given to patient.  Encouraged CARE program and Cancer Transitions along with other resources available through the cancer center.  Patient verbalized understanding.

## 2015-10-30 NOTE — Telephone Encounter (Signed)
Left message:  Dr. Grayland Ormond advised she can increase Gabapentin to 600mg  TID or could refer to neurology.

## 2015-10-30 NOTE — Telephone Encounter (Signed)
Patient has been taking the Gabapentin and the neuropathy is not improving.  The neuropathy in her feet is making it painful for her to walk.

## 2015-10-31 ENCOUNTER — Other Ambulatory Visit: Payer: Self-pay

## 2015-10-31 MED ORDER — GABAPENTIN 300 MG PO CAPS: 600.0000 mg | ORAL_CAPSULE | Freq: Three times a day (TID) | ORAL | Status: DC

## 2015-10-31 NOTE — Telephone Encounter (Signed)
Patient prefers to increase the Gabapentin at this time.  Will send a new rx to pharmacy.

## 2015-11-05 ENCOUNTER — Other Ambulatory Visit: Payer: Self-pay | Admitting: *Deleted

## 2015-11-05 MED ORDER — FUROSEMIDE 20 MG PO TABS: 20.0000 mg | ORAL_TABLET | Freq: Every day | ORAL | Status: DC

## 2015-11-05 MED ORDER — POTASSIUM CHLORIDE CRYS ER 20 MEQ PO TBCR: 40.0000 meq | EXTENDED_RELEASE_TABLET | Freq: Every day | ORAL | Status: DC

## 2015-11-05 NOTE — Telephone Encounter (Signed)
She has used Lasix  # 90 tabs in 2 months and is not on potassium replacement.    Ref Range 7mo ago (09/17/15) 40mo ago (09/06/15) 10mo ago (08/23/15) 99mo ago (08/16/15)     Sodium 135 - 145 mmol/L 135 136 137 135    Potassium 3.5 - 5.1 mmol/L 3.4 (L) 3.8 3.4 (L) 3.6

## 2015-11-05 NOTE — Telephone Encounter (Signed)
OK to refill lasix and add potassium 40 meq daily  E scribed

## 2015-11-16 ENCOUNTER — Encounter: Payer: Self-pay | Admitting: Radiation Oncology

## 2015-11-16 ENCOUNTER — Ambulatory Visit
Admission: RE | Admit: 2015-11-16 | Discharge: 2015-11-16 | Disposition: A | Discharge status: Home / Self Care | Payer: BLUE CROSS/BLUE SHIELD | Source: Ambulatory Visit | Attending: Radiation Oncology | Admitting: Radiation Oncology

## 2015-11-16 VITALS — BP 103/67 | HR 90 | Temp 97.8°F | Resp 18 | Wt 144.7 lb

## 2015-11-16 DIAGNOSIS — C50911 Malignant neoplasm of unspecified site of right female breast: Secondary | ICD-10-CM

## 2015-11-16 NOTE — Progress Notes (Signed)
Radiation Oncology Follow up Note  Name: KIRK CHEFF   Date:   11/16/2015 MRN:  [id number] DOB: 01-Nov-1961    This 54 y.o. female presents to the clinic today for patient is a 54 year old female now out 2-1/2 years having completed accelerated partial breast irradiation to her right breast status post wide local excision. For a triple negative invasive mammary carcinoma. She completed Cytoxan and Adriamycin and Taxol after MammoSite therapy. She seen today in routine follow-up and is doing well. She is not recent mammogram we are contacting her surgeon's office to try to arrange that. She also based on triple negative nature of disease is not on antiestrogen therapy. She specifically denies breast tenderness cough or bone pain.  REFERRING PROVIDER: Ward, Honor Loh, MD  HPI: As above.  COMPLICATIONS OF TREATMENT: none  FOLLOW UP COMPLIANCE: keeps appointments   PHYSICAL EXAM:  BP 103/67 mmHg  Pulse 90  Temp(Src) 97.8 F (36.6 C)  Resp 18  Wt 144 lb 11.7 oz (65.65 kg) Lungs are clear to A&P cardiac examination essentially unremarkable with regular rate and rhythm. No dominant mass or nodularity is noted in either breast in 2 positions examined. Incision is well-healed. No axillary or supraclavicular adenopathy is appreciated. Cosmetic result is excellent. Well-developed well-nourished patient in NAD. HEENT reveals PERLA, EOMI, discs not visualized.  Oral cavity is clear. No oral mucosal lesions are identified. Neck is clear without evidence of cervical or supraclavicular adenopathy. Lungs are clear to A&P. Cardiac examination is essentially unremarkable with regular rate and rhythm without murmur rub or thrill. Abdomen is benign with no organomegaly or masses noted. Motor sensory and DTR levels are equal and symmetric in the upper and lower extremities. Cranial nerves II through XII are grossly intact. Proprioception is intact. No peripheral adenopathy or edema is identified. No motor or  sensory levels are noted. Crude visual fields are within normal range.  RADIOLOGY RESULTS: Mammograms to be ordered through surgeon's office and will review when available  PLAN: At the present time she is doing well with no evidence of disease. I'm please were overall progress. We are contacting her surgeon's office for making appointments for follow-up mammograms. Otherwise I've asked to see her back in 1 year for follow-up. She continues close follow-up care with medical oncology and surgeon. Patient is to call with any concerns.  I would like to take this opportunity for allowing me to participate in the care of your patient.Armstead Peaks., MD

## 2015-11-30 ENCOUNTER — Other Ambulatory Visit: Payer: Self-pay | Admitting: *Deleted

## 2015-11-30 MED ORDER — GABAPENTIN 300 MG PO CAPS: 600.0000 mg | ORAL_CAPSULE | Freq: Three times a day (TID) | ORAL | Status: DC

## 2015-12-04 ENCOUNTER — Telehealth: Payer: Self-pay | Admitting: *Deleted

## 2015-12-04 MED ORDER — POTASSIUM CHLORIDE CRYS ER 20 MEQ PO TBCR: 40.0000 meq | EXTENDED_RELEASE_TABLET | Freq: Every day | ORAL | Status: DC

## 2015-12-04 NOTE — Telephone Encounter (Signed)
escribed

## 2015-12-07 ENCOUNTER — Other Ambulatory Visit: Payer: Self-pay

## 2015-12-07 DIAGNOSIS — C50511 Malignant neoplasm of lower-outer quadrant of right female breast: Secondary | ICD-10-CM

## 2015-12-17 ENCOUNTER — Inpatient Hospital Stay: Payer: BLUE CROSS/BLUE SHIELD

## 2015-12-17 ENCOUNTER — Inpatient Hospital Stay: Payer: BLUE CROSS/BLUE SHIELD | Attending: Oncology | Admitting: Oncology

## 2015-12-17 ENCOUNTER — Encounter: Payer: Self-pay | Admitting: Oncology

## 2015-12-17 DIAGNOSIS — Z9221 Personal history of antineoplastic chemotherapy: Secondary | ICD-10-CM | POA: Insufficient documentation

## 2015-12-17 DIAGNOSIS — Z171 Estrogen receptor negative status [ER-]: Secondary | ICD-10-CM | POA: Insufficient documentation

## 2015-12-17 DIAGNOSIS — C50511 Malignant neoplasm of lower-outer quadrant of right female breast: Secondary | ICD-10-CM | POA: Diagnosis not present

## 2015-12-17 DIAGNOSIS — G629 Polyneuropathy, unspecified: Secondary | ICD-10-CM

## 2015-12-17 DIAGNOSIS — Z79899 Other long term (current) drug therapy: Secondary | ICD-10-CM | POA: Diagnosis not present

## 2015-12-17 HISTORY — DX: Malignant neoplasm of lower-outer quadrant of right female breast: C50.511

## 2015-12-17 LAB — CBC WITH DIFFERENTIAL/PLATELET
BASOS ABS: 0 10*3/uL (ref 0–0.1)
Basophils Relative: 1 %
EOS ABS: 0.5 10*3/uL (ref 0–0.7)
EOS PCT: 10 %
HCT: 37.9 % (ref 35.0–47.0)
Hemoglobin: 12.8 g/dL (ref 12.0–16.0)
Lymphocytes Relative: 27 %
Lymphs Abs: 1.3 10*3/uL (ref 1.0–3.6)
MCH: 33 pg (ref 26.0–34.0)
MCHC: 33.7 g/dL (ref 32.0–36.0)
MCV: 98 fL (ref 80.0–100.0)
Monocytes Absolute: 0.6 10*3/uL (ref 0.2–0.9)
Monocytes Relative: 12 %
Neutro Abs: 2.4 10*3/uL (ref 1.4–6.5)
Neutrophils Relative %: 50 %
PLATELETS: 153 10*3/uL (ref 150–440)
RBC: 3.87 MIL/uL (ref 3.80–5.20)
RDW: 13.8 % (ref 11.5–14.5)
WBC: 4.7 10*3/uL (ref 3.6–11.0)

## 2015-12-17 LAB — COMPREHENSIVE METABOLIC PANEL
ALT: 14 U/L (ref 14–54)
AST: 25 U/L (ref 15–41)
Albumin: 3.9 g/dL (ref 3.5–5.0)
Alkaline Phosphatase: 90 U/L (ref 38–126)
Anion gap: 5 (ref 5–15)
BUN: 14 mg/dL (ref 6–20)
CHLORIDE: 98 mmol/L — AB (ref 101–111)
CO2: 31 mmol/L (ref 22–32)
CREATININE: 0.39 mg/dL — AB (ref 0.44–1.00)
Calcium: 8.8 mg/dL — ABNORMAL LOW (ref 8.9–10.3)
GFR calc non Af Amer: >60 mL/min (ref >60)
Glucose, Bld: 109 mg/dL — ABNORMAL HIGH (ref 65–99)
POTASSIUM: 3.6 mmol/L (ref 3.5–5.1)
SODIUM: 134 mmol/L — AB (ref 135–145)
Total Bilirubin: 0.6 mg/dL (ref 0.3–1.2)
Total Protein: 7.2 g/dL (ref 6.5–8.1)

## 2015-12-17 MED ORDER — HEPARIN SOD (PORK) LOCK FLUSH 100 UNIT/ML IV SOLN
500.0000 [IU] | Freq: Once | INTRAVENOUS | Status: AC
Start: 2015-12-17 — End: 2015-12-17
  Administered 2015-12-17: 500 [IU] via INTRAVENOUS
  Filled 2015-12-17: qty 5

## 2015-12-17 MED ORDER — PREGABALIN 50 MG PO CAPS: 50.0000 mg | ORAL_CAPSULE | Freq: Two times a day (BID) | ORAL | Status: DC

## 2015-12-17 MED ORDER — SODIUM CHLORIDE 0.9 % IJ SOLN
10.0000 mL | INTRAMUSCULAR | Status: DC | PRN
  Administered 2015-12-17: 10 mL via INTRAVENOUS
  Filled 2015-12-17: qty 10

## 2015-12-18 ENCOUNTER — Other Ambulatory Visit: Payer: Self-pay | Admitting: Family Medicine

## 2015-12-18 NOTE — Progress Notes (Signed)
[place]  [phone]  [phone]   DOS 12/17/2015  [name] DOB: 23-Nov-1961  MR#: [id number]  LGX#:[id number]  Patient Care Team: Maceo Pro, MD as PCP - General (Obstetrics and Gynecology) Gae Dry, MD as Referring Physician (Obstetrics and Gynecology) Robert Bellow, MD (General Surgery)  CHIEF COMPLAINT:  Chief Complaint  Patient presents with  . Breast Cancer    INTERVAL HISTORY:  Patient returns to clinic today for further evaluation following completion of chemotherapy. She continues to have a mild peripheral neuropathy as well as cracking of her fingernails, but otherwise feels well. She is currently taking Neurontin 600 mg 3 times daily. Patient states that this does not appear to be helping with her neuropathy. She does not complain of weakness or fatigue today. She does not complain of dizziness today. She has no other neurologic complaints. She denies any recent fevers. She denies any chest pain or shortness of breath. She denies any vomiting, constipation, or diarrhea. She has no urinary complaints. Patient offers no other specific complaints today.  REVIEW OF SYSTEMS:   Review of Systems  Constitutional: Negative for fever, chills, weight loss, malaise/fatigue and diaphoresis.  HENT: Negative.   Eyes: Negative.   Respiratory: Negative for cough, hemoptysis, sputum production, shortness of breath and wheezing.   Cardiovascular: Negative for chest pain, palpitations, orthopnea, claudication, leg swelling and PND.  Gastrointestinal: Negative for heartburn, nausea, vomiting, abdominal pain, diarrhea, constipation, blood in stool and melena.  Genitourinary: Negative.   Musculoskeletal: Negative.   Skin: Negative.   Neurological: Positive for tingling. Negative for dizziness, focal weakness, seizures and weakness.       Grade 2 peripheral neuropathy  Endo/Heme/Allergies: Does not bruise/bleed easily.    Psychiatric/Behavioral: Negative for depression. The patient is not nervous/anxious and does not have insomnia.     As per HPI. Otherwise, a complete review of systems is negatve.   PAST MEDICAL HISTORY: Past Medical History  Diagnosis Date  . 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 Wide excision, sentinel node biopsy.  . Breast cancer ([place])   . Dehydration 05/28/2015  . Malignant neoplasm of lower-outer quadrant of right female breast ([place]) 12/17/2015    PAST SURGICAL HISTORY: Past Surgical History  Procedure Laterality Date  . Colonoscopy  2013    Dr Vira Agar  . Breast biopsy Right 02-09-15    wide excision/sentinel node biopsy.  . Portacath placement  03-29-15    Dr Bary Castilla    FAMILY HISTORY Family History  Problem Relation Age of Onset  . Hypertension Mother   . Hypertension Father   . Diabetes Father   . Heart failure Mother     GYNECOLOGIC HISTORY:  No LMP recorded. Patient is not currently having periods (Reason: Chemotherapy).     ADVANCED DIRECTIVES:    HEALTH MAINTENANCE: Social History  Substance Use Topics  . Smoking status: Never Smoker   . Smokeless tobacco: Never Used  . Alcohol Use: No     Colonoscopy:  PAP:  Bone density:  Lipid panel:  No Known Allergies  Current Outpatient Prescriptions  Medication Sig Dispense Refill  . acetaminophen (TYLENOL) 325 MG tablet Take 650 mg by mouth every 6 (six) hours as needed.    . DiphenhydrAMINE HCl, Sleep, (UNISOM SLEEPGELS PO) Take by mouth as needed.    . furosemide (LASIX) 20 MG tablet Take 1 tablet (20 mg total) by mouth daily. Take  1 or 2 times per day as needed for swelling. 30 tablet 2  . gabapentin (NEURONTIN) 300 MG capsule Take 2 capsules (600 mg total) by mouth 3 (three) times daily. 180 capsule 1  . lidocaine-prilocaine (EMLA) cream Apply to port and cover with saran wrap 1-2 hours before chemotherapy (Patient  not taking: Reported on 11/16/2015) 30 g 2  . loratadine (CLARITIN) 10 MG tablet Take 10 mg by mouth daily as needed for allergies.    . naproxen sodium (RA NAPROXEN SODIUM) 220 MG tablet Take by mouth.    . potassium chloride SA (K-DUR,KLOR-CON) 20 MEQ tablet Take 2 tablets (40 mEq total) by mouth daily. 60 tablet 0  . pregabalin (LYRICA) 50 MG capsule Take 1 capsule (50 mg total) by mouth 2 (two) times daily. 60 capsule 0  . prochlorperazine (COMPAZINE) 10 MG tablet Take 1 tablet by mouth 3 (three) times daily as needed. Reported on 11/16/2015  0   No current facility-administered medications for this visit.   Facility-Administered Medications Ordered in Other Visits  Medication Dose Route Frequency Provider Last Rate Last Dose  . sodium chloride 0.9 % injection 10 mL  10 mL Intracatheter PRN Lloyd Huger, MD   10 mL at 06/13/15 1403  . sodium chloride 0.9 % injection 10 mL  10 mL Intravenous PRN Lloyd Huger, MD   10 mL at 10/30/15 1442    OBJECTIVE: There were no vitals taken for this visit.   There is no weight on file to calculate BMI.    ECOG FS:1 - Symptomatic but completely ambulatory  General: Well-developed, well-nourished, no acute distress. Eyes: Pink conjunctiva, anicteric sclera. HEENT: Normocephalic, moist mucous membranes, clear oropharnyx. Lungs: Clear to auscultation bilaterally. Heart: Regular rate and rhythm. No rubs, murmurs, or gallops. Abdomen: Soft, nontender, nondistended. No organomegaly noted, normoactive bowel sounds. Breast: Deferred. Musculoskeletal: No edema, cyanosis, or clubbing. Neuro: Alert, answering all questions appropriately. Cranial nerves grossly intact. Skin: No rashes or petechiae note. Psych: Normal affect. Lymphatics: No cervical or clavicular LAD.   LAB RESULTS:  Infusion on 12/17/2015  Component Date Value Ref Range Status  . WBC 12/17/2015 4.7  3.6 - 11.0 K/uL Final  . RBC 12/17/2015 3.87  3.80 - 5.20 MIL/uL Final  .  Hemoglobin 12/17/2015 12.8  12.0 - 16.0 g/dL Final  . HCT 12/17/2015 37.9  35.0 - 47.0 % Final  . MCV 12/17/2015 98.0  80.0 - 100.0 fL Final  . MCH 12/17/2015 33.0  26.0 - 34.0 pg Final  . MCHC 12/17/2015 33.7  32.0 - 36.0 g/dL Final  . RDW 12/17/2015 13.8  11.5 - 14.5 % Final  . Platelets 12/17/2015 153  150 - 440 K/uL Final  . Neutrophils Relative % 12/17/2015 50   Final  . Neutro Abs 12/17/2015 2.4  1.4 - 6.5 K/uL Final  . Lymphocytes Relative 12/17/2015 27   Final  . Lymphs Abs 12/17/2015 1.3  1.0 - 3.6 K/uL Final  . Monocytes Relative 12/17/2015 12   Final  . Monocytes Absolute 12/17/2015 0.6  0.2 - 0.9 K/uL Final  . Eosinophils Relative 12/17/2015 10   Final  . Eosinophils Absolute 12/17/2015 0.5  0 - 0.7 K/uL Final  . Basophils Relative 12/17/2015 1   Final  . Basophils Absolute 12/17/2015 0.0  0 - 0.1 K/uL Final  . Sodium 12/17/2015 134* 135 - 145 mmol/L Final  . Potassium 12/17/2015 3.6  3.5 - 5.1 mmol/L Final  . Chloride 12/17/2015 98* 101 - 111 mmol/L  Final  . CO2 12/17/2015 31  22 - 32 mmol/L Final  . Glucose, Bld 12/17/2015 109* 65 - 99 mg/dL Final  . BUN 12/17/2015 14  6 - 20 mg/dL Final  . Creatinine, Ser 12/17/2015 0.39* 0.44 - 1.00 mg/dL Final  . Calcium 12/17/2015 8.8* 8.9 - 10.3 mg/dL Final  . Total Protein 12/17/2015 7.2  6.5 - 8.1 g/dL Final  . Albumin 12/17/2015 3.9  3.5 - 5.0 g/dL Final  . AST 12/17/2015 25  15 - 41 U/L Final  . ALT 12/17/2015 14  14 - 54 U/L Final  . Alkaline Phosphatase 12/17/2015 90  38 - 126 U/L Final  . Total Bilirubin 12/17/2015 0.6  0.3 - 1.2 mg/dL Final  . GFR calc non Af Amer 12/17/2015 >60  >60 mL/min Final  . GFR calc Af Amer 12/17/2015 >60  >60 mL/min Final   Comment: (NOTE) The eGFR has been calculated using the CKD EPI equation. This calculation has not been validated in all clinical situations. eGFR's persistently <60 mL/min signify possible Chronic Kidney Disease.   . Anion gap 12/17/2015 5  5 - 15 Final    STUDIES: No  results found.  ASSESSMENT:  Carcinoma of breast, lower outer quadrant of right breast.  PLAN:   1. Carcinoma breast. 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 Patient is status post wide excision, MammoSite therapy, chemotherapy with Adriamycin and Cytoxan, as well as 12 weeks of Taxol. Clinically there is no evidence of recurrent disease.  We'll order routine diagnostic mammogram as she is due. 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 Patient is currently on 600 mg of Neurontin 3 times daily but reports having no improvement. Asked patient to hold gabapentin. We will trial patient on Lyrica 50 mg. Instructed patient to try first doses at night to see if they make her sleepy. Discussed and right specific instructions that over weekend she could try 50 mg in the morning as well as at bedtime to see if this would be an option for her as she works full-time. If Lyrica makes patient to drowsy then she was instructed to take only at bedtime and continue with 600 mg of gabapentin twice during the day.  Patient to call and update [place] regarding effectiveness. Prescription for Lyrica given to patient with no refills. Patient to return in approximately 3 months for further follow-up.  Patient expressed understanding and was in agreement with this plan. She also understands that She can call clinic at any time with any questions, concerns, or complaints.   Dr. Oliva Bustard was available for consultation and review of plan of care for this patient.  Breast cancer [facility])   Staging form: Breast, AJCC 7th Edition     Clinical stage from 03/26/2015: Stage IA (T1c, N0, M0) - Signed by Lloyd Huger, MD on 03/26/2015   Evlyn Kanner, NP   12/18/2015 8:43 AM

## 2015-12-19 ENCOUNTER — Telehealth: Payer: Self-pay

## 2015-12-19 NOTE — Telephone Encounter (Signed)
Patient was given rx for Lyrica for neuropathy when she saw [facility] Monday.  The price of Lyrica is $300 and not affordable so she would like to know what can she do next.

## 2015-12-19 NOTE — Telephone Encounter (Signed)
Have we done a neurology referral yet?

## 2015-12-21 NOTE — Telephone Encounter (Signed)
I have checked with Deedra Ehrich who reached out to a representative for Lyrica and they do not do samples since it is a controlled medication.    Patient does not want a referral at this time and she will hold on the the rx.

## 2015-12-25 ENCOUNTER — Telehealth: Payer: Self-pay | Admitting: *Deleted

## 2015-12-25 MED ORDER — FUROSEMIDE 20 MG PO TABS: 20.0000 mg | ORAL_TABLET | Freq: Every day | ORAL | Status: DC

## 2015-12-25 NOTE — Telephone Encounter (Signed)
Escribed

## 2015-12-26 ENCOUNTER — Telehealth: Payer: Self-pay | Admitting: *Deleted

## 2015-12-26 ENCOUNTER — Other Ambulatory Visit: Payer: Self-pay | Admitting: *Deleted

## 2015-12-26 DIAGNOSIS — G629 Polyneuropathy, unspecified: Secondary | ICD-10-CM

## 2015-12-26 NOTE — Telephone Encounter (Signed)
I spoke with patient to let her know referral had been made to [facility] Neurology.

## 2015-12-26 NOTE — Telephone Encounter (Signed)
Lets go ahead a refer her.  This has been an ongoing problem for a while.  Thanks!

## 2015-12-26 NOTE — Telephone Encounter (Signed)
Patient called in to report she cannot take Lyrica due to cost. She would like to know if you recommend increasing Gabapentin or referral to Neurologist at this point.

## 2016-01-07 ENCOUNTER — Other Ambulatory Visit: Payer: Self-pay | Admitting: *Deleted

## 2016-01-07 NOTE — Telephone Encounter (Signed)
    Reference Range 3 weeks ago 35mo ago    Sodium 135 - 145 mmol/L 134 (L) 135    Potassium 3.5 - 5.1 mmol/L 3.6 3.4 (L)

## 2016-01-28 ENCOUNTER — Inpatient Hospital Stay: Payer: BLUE CROSS/BLUE SHIELD | Attending: Oncology

## 2016-01-28 DIAGNOSIS — C50511 Malignant neoplasm of lower-outer quadrant of right female breast: Secondary | ICD-10-CM | POA: Insufficient documentation

## 2016-01-28 DIAGNOSIS — Z171 Estrogen receptor negative status [ER-]: Secondary | ICD-10-CM | POA: Diagnosis not present

## 2016-01-28 DIAGNOSIS — Z95828 Presence of other vascular implants and grafts: Secondary | ICD-10-CM

## 2016-01-28 DIAGNOSIS — Z452 Encounter for adjustment and management of vascular access device: Secondary | ICD-10-CM | POA: Diagnosis not present

## 2016-01-28 MED ORDER — SODIUM CHLORIDE 0.9% FLUSH
10.0000 mL | INTRAVENOUS | Status: DC | PRN
  Administered 2016-01-28: 10 mL via INTRAVENOUS
  Filled 2016-01-28: qty 10

## 2016-01-28 MED ORDER — HEPARIN SOD (PORK) LOCK FLUSH 100 UNIT/ML IV SOLN
500.0000 [IU] | Freq: Once | INTRAVENOUS | Status: AC
  Administered 2016-01-28: 500 [IU] via INTRAVENOUS

## 2016-01-29 ENCOUNTER — Ambulatory Visit
Admission: RE | Admit: 2016-01-29 | Discharge: 2016-01-29 | Disposition: A | Discharge status: Home / Self Care | Payer: BLUE CROSS/BLUE SHIELD | Source: Ambulatory Visit | Attending: General Surgery | Admitting: General Surgery

## 2016-01-29 ENCOUNTER — Other Ambulatory Visit: Payer: Self-pay | Admitting: General Surgery

## 2016-01-29 DIAGNOSIS — C50511 Malignant neoplasm of lower-outer quadrant of right female breast: Secondary | ICD-10-CM

## 2016-01-29 DIAGNOSIS — L7634 Postprocedural seroma of skin and subcutaneous tissue following other procedure: Secondary | ICD-10-CM | POA: Insufficient documentation

## 2016-01-31 ENCOUNTER — Other Ambulatory Visit: Payer: Self-pay

## 2016-01-31 MED ORDER — GABAPENTIN 300 MG PO CAPS: 600.0000 mg | ORAL_CAPSULE | Freq: Three times a day (TID) | ORAL | Status: DC

## 2016-02-04 ENCOUNTER — Ambulatory Visit (INDEPENDENT_AMBULATORY_CARE_PROVIDER_SITE_OTHER): Payer: 59 | Admitting: General Surgery

## 2016-02-04 ENCOUNTER — Encounter: Payer: Self-pay | Admitting: General Surgery

## 2016-02-04 VITALS — BP 100/70 | HR 64 | Resp 12 | Ht 64.0 in | Wt 150.0 lb

## 2016-02-04 DIAGNOSIS — C50911 Malignant neoplasm of unspecified site of right female breast: Secondary | ICD-10-CM

## 2016-02-04 DIAGNOSIS — C50511 Malignant neoplasm of lower-outer quadrant of right female breast: Secondary | ICD-10-CM

## 2016-02-04 NOTE — Progress Notes (Signed)
Patient ID: Wanda Lopez, female   DOB: 12-04-1960, 55 y.o.   MRN: [id number]  Chief Complaint  Patient presents with  . Follow-up    mammogram    HPI Wanda Lopez is a 55 y.o. female who presents for a breast evaluation. The most recent mammogram was done on 01/29/16.  Patient does perform regular self breast checks and gets regular mammograms done.  Patient finished her chemo in October, 2016.  HPI  Past Medical History  Diagnosis Date  . 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 Wide excision, sentinel node biopsy.  . Dehydration 05/28/2015  . Malignant neoplasm of lower-outer quadrant of right female breast ([place]) 12/17/2015  . Breast cancer [facility]) 2016    right breast    Past Surgical History  Procedure Laterality Date  . Colonoscopy  2013    Dr Vira Agar  . Portacath placement  03-29-15    Dr Bary Castilla  . Breast excisional biopsy Right 02-21-15    wide excision/sentinel node biopsy.    Family History  Problem Relation Age of Onset  . Hypertension Mother   . Hypertension Father   . Diabetes Father   . Heart failure Mother     Social History Social History  Substance Use Topics  . Smoking status: Never Smoker   . Smokeless tobacco: Never Used  . Alcohol Use: No    No Known Allergies  Current Outpatient Prescriptions  Medication Sig Dispense Refill  . acetaminophen (TYLENOL) 325 MG tablet Take 650 mg by mouth every 6 (six) hours as needed.    Marland Kitchen b complex vitamins tablet Take 1 tablet by mouth daily.    . Biotin 1 MG CAPS Take by mouth daily.    . furosemide (LASIX) 20 MG tablet Take 1 tablet (20 mg total) by mouth daily. Take 1 or 2 times per day as needed for swelling. 30 tablet 2  . gabapentin (NEURONTIN) 300 MG capsule Take 2 capsules (600 mg total) by mouth 3 (three) times daily. 180 capsule 1  . loratadine (CLARITIN) 10 MG tablet Take 10 mg by mouth daily as needed for  allergies.    . potassium chloride SA (K-DUR,KLOR-CON) 20 MEQ tablet Take 2 tablets (40 mEq total) by mouth daily. 60 tablet 0   No current facility-administered medications for this visit.   Facility-Administered Medications Ordered in Other Visits  Medication Dose Route Frequency Provider Last Rate Last Dose  . sodium chloride 0.9 % injection 10 mL  10 mL Intracatheter PRN Lloyd Huger, MD   10 mL at 06/13/15 1403  . sodium chloride 0.9 % injection 10 mL  10 mL Intravenous PRN Lloyd Huger, MD   10 mL at 10/30/15 1442    Review of Systems Review of Systems  Constitutional: Negative.   Respiratory: Negative.   Cardiovascular: Negative.     Blood pressure 100/70, pulse 64, resp. rate 12, height 5\' 4"  (1.626 m), weight 150 lb (68.04 kg).  Physical Exam Physical Exam  Constitutional: She is oriented to person, place, and time. She appears well-developed and well-nourished.  Eyes: Conjunctivae are normal. No scleral icterus.  Neck: Neck supple.  Cardiovascular: Normal rate, regular rhythm and normal heart sounds.   Pulmonary/Chest: Effort normal and breath sounds normal. Right breast exhibits no inverted nipple, no mass, no nipple discharge, no skin change and no tenderness. Left breast exhibits no inverted nipple, no mass, no nipple discharge, no skin  change and no tenderness.    Excellent volume preservation on the right. Right side about 10% larger than left. Minimal left breast ptosis.  Lymphadenopathy:    She has no cervical adenopathy.    She has no axillary adenopathy.  Neurological: She is alert and oriented to person, place, and time.  Skin: Skin is warm and dry.    Data Reviewed Postoperative changes. Focal seroma measuring 1 x 3.5 cm. This is superficially located. BI-RADS-1.  Assessment    Doing well 1 year post surgery, four-month status completion of adjuvant chemotherapy.    Plan    The patient will discuss with medical oncology port removal.    Patient will be asked to return to the office in one year with a bilateral diagnotic mammogram.  PCP:  No Pcp   This information has been scribed by Wanda Lopez CMA.    Wanda Lopez 02/04/2016, 9:44 AM

## 2016-02-04 NOTE — Patient Instructions (Signed)
Patient will be asked to return to the office in one year with a bilateral diagnotic  mammogram. 

## 2016-02-07 ENCOUNTER — Other Ambulatory Visit: Payer: Self-pay

## 2016-02-07 MED ORDER — FUROSEMIDE 20 MG PO TABS: 20.0000 mg | ORAL_TABLET | Freq: Every day | ORAL | Status: DC

## 2016-02-08 ENCOUNTER — Telehealth: Payer: Self-pay | Admitting: Oncology

## 2016-02-08 NOTE — Telephone Encounter (Signed)
She saw the neurologist today that Woodfin Ganja referred her to and he has put her on cymbalta in addition to the gabapentin she was already taking. Later when she got home she looked at the paperwork and realized the dosage he saw for her cymbalta was recorded incorrectly and now she is wondering about safe dosage for gabapentin with the existing meds. Neurologist's office is closed. Can you please call her to advise? Thanks. [phone].

## 2016-02-08 NOTE — Telephone Encounter (Signed)
This is something she needs to ask her neurologist, she should call and speak with the on call there. Please advise her of this

## 2016-03-17 ENCOUNTER — Inpatient Hospital Stay: Payer: 59

## 2016-03-17 ENCOUNTER — Ambulatory Visit
Admission: RE | Admit: 2016-03-17 | Discharge: 2016-03-17 | Disposition: A | Discharge status: Home / Self Care | Payer: 59 | Source: Ambulatory Visit | Attending: Oncology | Admitting: Oncology

## 2016-03-17 ENCOUNTER — Inpatient Hospital Stay: Payer: 59 | Attending: Oncology | Admitting: Oncology

## 2016-03-17 VITALS — BP 114/76 | HR 81 | Temp 97.1°F | Resp 18 | Wt 148.4 lb

## 2016-03-17 DIAGNOSIS — Z95828 Presence of other vascular implants and grafts: Secondary | ICD-10-CM

## 2016-03-17 DIAGNOSIS — Z79899 Other long term (current) drug therapy: Secondary | ICD-10-CM | POA: Insufficient documentation

## 2016-03-17 DIAGNOSIS — C50511 Malignant neoplasm of lower-outer quadrant of right female breast: Secondary | ICD-10-CM

## 2016-03-17 DIAGNOSIS — M25572 Pain in left ankle and joints of left foot: Principal | ICD-10-CM

## 2016-03-17 DIAGNOSIS — M25571 Pain in right ankle and joints of right foot: Secondary | ICD-10-CM | POA: Insufficient documentation

## 2016-03-17 DIAGNOSIS — Z923 Personal history of irradiation: Secondary | ICD-10-CM | POA: Insufficient documentation

## 2016-03-17 DIAGNOSIS — C50911 Malignant neoplasm of unspecified site of right female breast: Secondary | ICD-10-CM

## 2016-03-17 DIAGNOSIS — R938 Abnormal findings on diagnostic imaging of other specified body structures: Secondary | ICD-10-CM | POA: Diagnosis not present

## 2016-03-17 DIAGNOSIS — Z9221 Personal history of antineoplastic chemotherapy: Secondary | ICD-10-CM | POA: Diagnosis not present

## 2016-03-17 DIAGNOSIS — M7731 Calcaneal spur, right foot: Secondary | ICD-10-CM | POA: Diagnosis not present

## 2016-03-17 DIAGNOSIS — G629 Polyneuropathy, unspecified: Secondary | ICD-10-CM | POA: Diagnosis not present

## 2016-03-17 DIAGNOSIS — Z171 Estrogen receptor negative status [ER-]: Secondary | ICD-10-CM | POA: Insufficient documentation

## 2016-03-17 LAB — BASIC METABOLIC PANEL
Anion gap: 3 — ABNORMAL LOW (ref 5–15)
BUN: 11 mg/dL (ref 6–20)
CALCIUM: 8.5 mg/dL — AB (ref 8.9–10.3)
CO2: 29 mmol/L (ref 22–32)
CREATININE: 0.58 mg/dL (ref 0.44–1.00)
Chloride: 101 mmol/L (ref 101–111)
Glucose, Bld: 117 mg/dL — ABNORMAL HIGH (ref 65–99)
Potassium: 3.5 mmol/L (ref 3.5–5.1)
SODIUM: 133 mmol/L — AB (ref 135–145)

## 2016-03-17 LAB — CBC WITH DIFFERENTIAL/PLATELET
BASOS PCT: 1 %
Basophils Absolute: 0.1 10*3/uL (ref 0–0.1)
EOS ABS: 0.4 10*3/uL (ref 0–0.7)
EOS PCT: 8 %
HCT: 36.8 % (ref 35.0–47.0)
Hemoglobin: 12.9 g/dL (ref 12.0–16.0)
Lymphocytes Relative: 24 %
Lymphs Abs: 1.4 10*3/uL (ref 1.0–3.6)
MCH: 34.4 pg — ABNORMAL HIGH (ref 26.0–34.0)
MCHC: 35.1 g/dL (ref 32.0–36.0)
MCV: 97.8 fL (ref 80.0–100.0)
MONO ABS: 0.6 10*3/uL (ref 0.2–0.9)
MONOS PCT: 10 %
Neutro Abs: 3.4 10*3/uL (ref 1.4–6.5)
Neutrophils Relative %: 57 %
PLATELETS: 142 10*3/uL — AB (ref 150–440)
RBC: 3.76 MIL/uL — ABNORMAL LOW (ref 3.80–5.20)
RDW: 13.7 % (ref 11.5–14.5)
WBC: 5.8 10*3/uL (ref 3.6–11.0)

## 2016-03-17 MED ORDER — SODIUM CHLORIDE 0.9% FLUSH
10.0000 mL | INTRAVENOUS | Status: DC | PRN
  Administered 2016-03-17: 10 mL via INTRAVENOUS
  Filled 2016-03-17: qty 10

## 2016-03-17 MED ORDER — HEPARIN SOD (PORK) LOCK FLUSH 100 UNIT/ML IV SOLN
500.0000 [IU] | Freq: Once | INTRAVENOUS | Status: AC
  Administered 2016-03-17: 500 [IU] via INTRAVENOUS

## 2016-03-17 NOTE — Progress Notes (Signed)
[place]  [phone]  [phone]   DOS 12/17/2015  Wanda Lopez DOB: May 05, 1961  MR#: [id number]  RWE#:[id number]  Patient Care Team: No Pcp Per Patient as PCP - General (General Practice) Gae Dry, MD as Referring Physician (Obstetrics and Gynecology) Robert Bellow, MD (General Surgery)  CHIEF COMPLAINT:  Chief Complaint  Patient presents with  . Breast Cancer    INTERVAL HISTORY:  Patient returns to clinic toay for 3 month follow up.  She continues to have a mild peripheral neuropathy that is being managed by neurology with gabapentin and cymbalta.She is complaining of ankle pain in both ankles and she has trouble walking up and down steps. She thinks she has fluid in her ankles which is causing this even though she is taking 20 mg of furosemide BID. She otherwise does not have any other complaints. She does not complain of weakness or fatigue today. She does not complain of dizziness today. She has no other neurologic complaints. She denies any recent fevers. She denies any chest pain or shortness of breath. She denies any vomiting, constipation, or diarrhea. She has no urinary complaints. Patient offers no other specific complaints today.  REVIEW OF SYSTEMS:   Review of Systems  Constitutional: Negative for fever, chills, weight loss, malaise/fatigue and diaphoresis.  HENT: Negative.   Eyes: Negative.   Respiratory: Negative for cough, hemoptysis, sputum production, shortness of breath and wheezing.   Cardiovascular: Negative for chest pain, palpitations, orthopnea, claudication, leg swelling and PND.  Gastrointestinal: Negative for heartburn, nausea, vomiting, abdominal pain, diarrhea, constipation, blood in stool and melena.  Genitourinary: Negative.   Musculoskeletal:       Ankle pain  Skin: Negative.   Neurological: Positive for tingling and sensory change. Negative for dizziness, focal weakness, seizures and weakness.    Endo/Heme/Allergies: Does not bruise/bleed easily.  Psychiatric/Behavioral: Negative for depression. The patient is not nervous/anxious and does not have insomnia.     As per HPI. Otherwise, a complete review of systems is negatve.   PAST MEDICAL HISTORY: Past Medical History  Diagnosis Date  . 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 Wide excision, sentinel node biopsy.  . Dehydration 05/28/2015  . Malignant neoplasm of lower-outer quadrant of right female breast ([place]) 12/17/2015  . Breast cancer ([place]) 2016    right breast    PAST SURGICAL HISTORY: Past Surgical History  Procedure Laterality Date  . Colonoscopy  2013    Dr Vira Agar  . Portacath placement  03-29-15    Dr Bary Castilla  . Breast excisional biopsy Right 02-21-15    wide excision/sentinel node biopsy.    FAMILY HISTORY Family History  Problem Relation Age of Onset  . Hypertension Mother   . Hypertension Father   . Diabetes Father   . Heart failure Mother     GYNECOLOGIC HISTORY:  No LMP recorded. Patient is not currently having periods (Reason: Chemotherapy).     ADVANCED DIRECTIVES:    HEALTH MAINTENANCE: Social History  Substance Use Topics  . Smoking status: Never Smoker   . Smokeless tobacco: Never Used  . Alcohol Use: No     Colonoscopy:  PAP:  Bone density:  Lipid panel:  No Known Allergies  Current Outpatient Prescriptions  Medication Sig Dispense Refill  . acetaminophen (TYLENOL) 325 MG tablet Take 650 mg by mouth every 6 (six) hours as needed.    Marland Kitchen b complex vitamins tablet  Take 1 tablet by mouth daily.    . Biotin 1 MG CAPS Take by mouth daily.    . DULoxetine (CYMBALTA) 20 MG capsule Take by mouth.    . furosemide (LASIX) 20 MG tablet Take 1 tablet (20 mg total) by mouth daily. Take 1 or 2 times per day as needed for swelling. 60 tablet 1  . gabapentin (NEURONTIN) 300 MG capsule Take 2 capsules (600 mg  total) by mouth 3 (three) times daily. (Patient taking differently: Take 300 mg by mouth 3 (three) times daily. ) 180 capsule 1  . loratadine (CLARITIN) 10 MG tablet Take 10 mg by mouth daily as needed for allergies.    . potassium chloride SA (K-DUR,KLOR-CON) 20 MEQ tablet Take 2 tablets (40 mEq total) by mouth daily. 60 tablet 0   No current facility-administered medications for this visit.   Facility-Administered Medications Ordered in Other Visits  Medication Dose Route Frequency Provider Last Rate Last Dose  . sodium chloride 0.9 % injection 10 mL  10 mL Intracatheter PRN Lloyd Huger, MD   10 mL at 06/13/15 1403  . sodium chloride 0.9 % injection 10 mL  10 mL Intravenous PRN Lloyd Huger, MD   10 mL at 10/30/15 1442  . sodium chloride flush (NS) 0.9 % injection 10 mL  10 mL Intravenous PRN Lloyd Huger, MD   10 mL at 03/17/16 1505    OBJECTIVE: BP 114/76 mmHg  Pulse 81  Temp(Src) 97.1 F (36.2 C) (Tympanic)  Resp 18  Wt 148 lb 5.9 oz (67.3 kg)   Body mass index is 25.46 kg/(m^2).    ECOG FS:1 - Symptomatic but completely ambulatory  General: Well-developed, well-nourished, no acute distress. Eyes: Pink conjunctiva, anicteric sclera. HEENT: Normocephalic, moist mucous membranes, clear oropharnyx. Lungs: Clear to auscultation bilaterally. Heart: Regular rate and rhythm. No rubs, murmurs, or gallops. Abdomen: Soft, nontender, nondistended. No organomegaly noted, normoactive bowel sounds. Breast: Deferred. Musculoskeletal: No edema, cyanosis, or clubbing. Neuro: Alert, answering all questions appropriately. Cranial nerves grossly intact. Skin: No rashes or petechiae note. Psych: Normal affect. Lymphatics: No cervical or clavicular LAD.   LAB RESULTS:  Appointment on 03/17/2016  Component Date Value Ref Range Status  . WBC 03/17/2016 5.8  3.6 - 11.0 K/uL Final  . RBC 03/17/2016 3.76* 3.80 - 5.20 MIL/uL Final  . Hemoglobin 03/17/2016 12.9  12.0 - 16.0 g/dL  Final  . HCT 03/17/2016 36.8  35.0 - 47.0 % Final  . MCV 03/17/2016 97.8  80.0 - 100.0 fL Final  . MCH 03/17/2016 34.4* 26.0 - 34.0 pg Final  . MCHC 03/17/2016 35.1  32.0 - 36.0 g/dL Final  . RDW 03/17/2016 13.7  11.5 - 14.5 % Final  . Platelets 03/17/2016 142* 150 - 440 K/uL Final  . Neutrophils Relative % 03/17/2016 57   Final  . Neutro Abs 03/17/2016 3.4  1.4 - 6.5 K/uL Final  . Lymphocytes Relative 03/17/2016 24   Final  . Lymphs Abs 03/17/2016 1.4  1.0 - 3.6 K/uL Final  . Monocytes Relative 03/17/2016 10   Final  . Monocytes Absolute 03/17/2016 0.6  0.2 - 0.9 K/uL Final  . Eosinophils Relative 03/17/2016 8   Final  . Eosinophils Absolute 03/17/2016 0.4  0 - 0.7 K/uL Final  . Basophils Relative 03/17/2016 1   Final  . Basophils Absolute 03/17/2016 0.1  0 - 0.1 K/uL Final  . Sodium 03/17/2016 133* 135 - 145 mmol/L Final  . Potassium 03/17/2016 3.5  3.5 -  5.1 mmol/L Final  . Chloride 03/17/2016 101  101 - 111 mmol/L Final  . CO2 03/17/2016 29  22 - 32 mmol/L Final  . Glucose, Bld 03/17/2016 117* 65 - 99 mg/dL Final  . BUN 03/17/2016 11  6 - 20 mg/dL Final  . Creatinine, Ser 03/17/2016 0.58  0.44 - 1.00 mg/dL Final  . Calcium 03/17/2016 8.5* 8.9 - 10.3 mg/dL Final  . GFR calc non Af Amer 03/17/2016 >60  >60 mL/min Final  . GFR calc Af Amer 03/17/2016 >60  >60 mL/min Final   Comment: (NOTE) The eGFR has been calculated using the CKD EPI equation. This calculation has not been validated in all clinical situations. eGFR's persistently <60 mL/min signify possible Chronic Kidney Disease.   . Anion gap 03/17/2016 3* 5 - 15 Final   Lab Results  Component Value Date   LABCA2 23.9 03/17/2016    STUDIES: No results found.  ASSESSMENT:  Carcinoma of breast, lower outer quadrant of right breast.  PLAN:   1. Carcinoma breast. 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 Patient is status post wide excision, MammoSite therapy, chemotherapy with Adriamycin and Cytoxan, as well as 12  weeks of Taxol. Clinically there is no evidence of recurrent disease.  Mammogram January 29, 2016 Birads-1 repeat in one year. CA-27-29 continues to be within normal limits at 23.9. 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 Currently taking Cymbalta and Gabapentin 100 mg TID managed by neurology.  3. Ankle pain/stiffness: Unsure that lasix is working. Hold lasix for one week. If swelling resumes then restart lasix if no changes then stop lasix.  Bilateral ankle xrays. Call patient with results. If abnormal refer to podiatrist. 4. Hypocalcemia: 8.5 today. Patient to take calcium/vitamin D OTC BID. 5. Follow up in 3 months with lab work.  Patient expressed understanding and was in agreement with this plan. She also understands that She can call clinic at any time with any questions, concerns, or complaints.     Breast cancer [facility])   Staging form: Breast, AJCC 7th Edition     Clinical stage from 03/26/2015: Stage IA (T1c, N0, M0) - Signed by Lloyd Huger, MD on 03/26/2015   Mayra Reel, NP   03/17/2016 4:39 PM  Patient was seen and evaluated independently and I agree with the assessment and plan as dictated above.  Lloyd Huger, MD 03/18/2016 9:18 AM

## 2016-03-17 NOTE — Progress Notes (Signed)
Patient has been evaluated by neurology regarding neuropathy and they have her tapering down the Gabapentin and start taking Cymbalta 20mg .  She's not sure if this has helped much because she was on her feet most of the weekend and her ankles are swollen.  The ankle edema is her main concern today because she has had no improvement despite taking Furosemide 20mg  BID and the edema makes it difficult to walk.  Also would like to discuss running a tumor marker today our lab did draw for a CA27.29 but the test was not ordered so if the test needs to be ran there needs to be an order entered.

## 2016-03-18 LAB — CANCER ANTIGEN 27.29: CA 27.29: 23.9 U/mL (ref 0.0–38.6)

## 2016-04-15 LAB — HM PAP SMEAR: HM PAP: NEGATIVE

## 2016-04-21 ENCOUNTER — Other Ambulatory Visit: Payer: Self-pay

## 2016-04-21 MED ORDER — FUROSEMIDE 20 MG PO TABS: 20.0000 mg | ORAL_TABLET | Freq: Every day | ORAL | Status: DC

## 2016-04-22 ENCOUNTER — Other Ambulatory Visit: Payer: Self-pay | Admitting: Obstetrics & Gynecology

## 2016-04-22 DIAGNOSIS — Z8262 Family history of osteoporosis: Secondary | ICD-10-CM

## 2016-04-22 DIAGNOSIS — Z13820 Encounter for screening for osteoporosis: Secondary | ICD-10-CM

## 2016-05-02 ENCOUNTER — Other Ambulatory Visit: Payer: Self-pay | Admitting: *Deleted

## 2016-05-02 ENCOUNTER — Inpatient Hospital Stay: Payer: 59 | Attending: Oncology

## 2016-05-02 DIAGNOSIS — Z171 Estrogen receptor negative status [ER-]: Secondary | ICD-10-CM | POA: Insufficient documentation

## 2016-05-02 DIAGNOSIS — Z452 Encounter for adjustment and management of vascular access device: Secondary | ICD-10-CM | POA: Diagnosis not present

## 2016-05-02 DIAGNOSIS — Z95828 Presence of other vascular implants and grafts: Secondary | ICD-10-CM

## 2016-05-02 DIAGNOSIS — C50511 Malignant neoplasm of lower-outer quadrant of right female breast: Secondary | ICD-10-CM | POA: Insufficient documentation

## 2016-05-02 MED ORDER — SODIUM CHLORIDE 0.9% FLUSH
10.0000 mL | INTRAVENOUS | Status: DC | PRN
  Administered 2016-05-02: 10 mL via INTRAVENOUS
  Filled 2016-05-02: qty 10

## 2016-05-02 MED ORDER — HEPARIN SOD (PORK) LOCK FLUSH 100 UNIT/ML IV SOLN
500.0000 [IU] | Freq: Once | INTRAVENOUS | Status: AC
  Administered 2016-05-02: 500 [IU] via INTRAVENOUS

## 2016-05-02 MED ORDER — POTASSIUM CHLORIDE CRYS ER 20 MEQ PO TBCR: 40.0000 meq | EXTENDED_RELEASE_TABLET | Freq: Every day | ORAL | Status: DC

## 2016-05-07 ENCOUNTER — Ambulatory Visit: Payer: 59

## 2016-05-13 ENCOUNTER — Ambulatory Visit
Admission: RE | Admit: 2016-05-13 | Discharge: 2016-05-13 | Disposition: A | Discharge status: Home / Self Care | Payer: 59 | Source: Ambulatory Visit | Attending: Obstetrics & Gynecology | Admitting: Obstetrics & Gynecology

## 2016-05-13 DIAGNOSIS — M85851 Other specified disorders of bone density and structure, right thigh: Secondary | ICD-10-CM | POA: Insufficient documentation

## 2016-05-13 DIAGNOSIS — M810 Age-related osteoporosis without current pathological fracture: Secondary | ICD-10-CM | POA: Insufficient documentation

## 2016-05-13 DIAGNOSIS — Z13820 Encounter for screening for osteoporosis: Secondary | ICD-10-CM | POA: Diagnosis present

## 2016-05-13 DIAGNOSIS — Z8262 Family history of osteoporosis: Secondary | ICD-10-CM

## 2016-05-28 ENCOUNTER — Other Ambulatory Visit: Payer: Self-pay | Admitting: *Deleted

## 2016-05-28 DIAGNOSIS — C50511 Malignant neoplasm of lower-outer quadrant of right female breast: Secondary | ICD-10-CM

## 2016-05-28 MED ORDER — POTASSIUM CHLORIDE CRYS ER 20 MEQ PO TBCR: 40.0000 meq | EXTENDED_RELEASE_TABLET | Freq: Every day | ORAL | Status: DC

## 2016-05-28 NOTE — Telephone Encounter (Signed)
   Ref Range 79mo ago    Sodium 135 - 145 mmol/L 133 (L)   Potassium 3.5 - 5.1 mmol/L 3.5        Next appt is in July

## 2016-06-16 ENCOUNTER — Inpatient Hospital Stay: Payer: 59 | Attending: Oncology | Admitting: Oncology

## 2016-06-16 ENCOUNTER — Inpatient Hospital Stay: Payer: 59

## 2016-06-16 VITALS — BP 112/76 | HR 84 | Temp 97.6°F | Resp 18 | Wt 148.8 lb

## 2016-06-16 DIAGNOSIS — C50511 Malignant neoplasm of lower-outer quadrant of right female breast: Secondary | ICD-10-CM | POA: Insufficient documentation

## 2016-06-16 DIAGNOSIS — C50911 Malignant neoplasm of unspecified site of right female breast: Secondary | ICD-10-CM

## 2016-06-16 DIAGNOSIS — G629 Polyneuropathy, unspecified: Secondary | ICD-10-CM | POA: Diagnosis not present

## 2016-06-16 DIAGNOSIS — Z171 Estrogen receptor negative status [ER-]: Secondary | ICD-10-CM | POA: Insufficient documentation

## 2016-06-16 DIAGNOSIS — Z9221 Personal history of antineoplastic chemotherapy: Secondary | ICD-10-CM | POA: Insufficient documentation

## 2016-06-16 DIAGNOSIS — Z79899 Other long term (current) drug therapy: Secondary | ICD-10-CM | POA: Insufficient documentation

## 2016-06-16 LAB — BASIC METABOLIC PANEL
ANION GAP: 8 (ref 5–15)
BUN: 16 mg/dL (ref 6–20)
CALCIUM: 9 mg/dL (ref 8.9–10.3)
CO2: 30 mmol/L (ref 22–32)
CREATININE: 0.5 mg/dL (ref 0.44–1.00)
Chloride: 99 mmol/L — ABNORMAL LOW (ref 101–111)
GFR calc Af Amer: >60 mL/min (ref >60)
GLUCOSE: 110 mg/dL — AB (ref 65–99)
Potassium: 3.5 mmol/L (ref 3.5–5.1)
Sodium: 137 mmol/L (ref 135–145)

## 2016-06-16 LAB — CBC WITH DIFFERENTIAL/PLATELET
BASOS ABS: 0 10*3/uL (ref 0–0.1)
Basophils Relative: 1 %
EOS PCT: 8 %
Eosinophils Absolute: 0.4 10*3/uL (ref 0–0.7)
HCT: 39.5 % (ref 35.0–47.0)
Hemoglobin: 13.9 g/dL (ref 12.0–16.0)
LYMPHS PCT: 28 %
Lymphs Abs: 1.3 10*3/uL (ref 1.0–3.6)
MCH: 34.8 pg — AB (ref 26.0–34.0)
MCHC: 35.1 g/dL (ref 32.0–36.0)
MCV: 99.2 fL (ref 80.0–100.0)
MONO ABS: 0.5 10*3/uL (ref 0.2–0.9)
MONOS PCT: 10 %
Neutro Abs: 2.5 10*3/uL (ref 1.4–6.5)
Neutrophils Relative %: 53 %
PLATELETS: 158 10*3/uL (ref 150–440)
RBC: 3.98 MIL/uL (ref 3.80–5.20)
RDW: 13.6 % (ref 11.5–14.5)
WBC: 4.7 10*3/uL (ref 3.6–11.0)

## 2016-06-16 MED ORDER — HEPARIN SOD (PORK) LOCK FLUSH 100 UNIT/ML IV SOLN
500.0000 [IU] | Freq: Once | INTRAVENOUS | Status: AC
  Administered 2016-06-16: 500 [IU] via INTRAVENOUS
  Filled 2016-06-16: qty 5

## 2016-06-16 MED ORDER — SODIUM CHLORIDE 0.9% FLUSH
10.0000 mL | Freq: Once | INTRAVENOUS | Status: AC
  Administered 2016-06-16: 10 mL via INTRAVENOUS
  Filled 2016-06-16: qty 10

## 2016-06-16 NOTE — Progress Notes (Signed)
Continues to have neuropathy in both feet and questions if can stop cymbalta to see if it makes a difference when taking.

## 2016-06-17 LAB — CANCER ANTIGEN 27.29: CA 27.29: 21.2 U/mL (ref 0.0–38.6)

## 2016-06-23 NOTE — Progress Notes (Signed)
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  INTERVAL HISTORY:   Patient returns to clinic toay for routine 3 month follow-up  She continues to have a mild peripheral neuropathy that is being managed by neurology with gabapentin and Cymbalta.Although she states the Cymbalta does not work and is thinking of discontinuing. She otherwise feels well and is asymptomatic. She does not complain of weakness or fatigue today. She does not complain of dizziness today. She has no other neurologic complaints. She denies any recent fevers. She denies any chest pain or shortness of breath. She denies any vomiting, constipation, or diarrhea. She has no urinary complaints. Patient offers no further specific complaints today.  REVIEW OF SYSTEMS:   Review of Systems  Constitutional: Negative.  Negative for fever, malaise/fatigue and weight loss.  Respiratory: Negative for shortness of breath.   Cardiovascular: Negative.  Negative for chest pain.  Gastrointestinal: Negative.   Genitourinary: Negative.   Musculoskeletal: Negative.   Neurological: Positive for sensory change. Negative for weakness.  Psychiatric/Behavioral: Negative.  The patient is not nervous/anxious.     As per HPI. Otherwise, a complete review of systems is negatve.   PAST MEDICAL HISTORY: Past Medical History:  Diagnosis Date  . Breast cancer ([place]) 2016   right breast  . 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  Wide excision, sentinel node biopsy.  . Dehydration 05/28/2015  . Malignant neoplasm of lower-outer quadrant of right female breast ([place]) 12/17/2015    PAST SURGICAL HISTORY: Past Surgical History:  Procedure Laterality Date  . BREAST EXCISIONAL BIOPSY Right 02-21-15   wide excision/sentinel node biopsy.  . COLONOSCOPY  2013   Dr Vira Agar  . PORTACATH PLACEMENT  03-29-15   Dr Bary Castilla    FAMILY HISTORY Family History  Problem Relation Age of Onset  . Hypertension Mother   . Hypertension Father   . Diabetes Father   . Heart failure Mother     GYNECOLOGIC HISTORY:  No LMP recorded. Patient is not currently having periods (Reason: Chemotherapy).     ADVANCED DIRECTIVES:    HEALTH MAINTENANCE: Social History  Substance Use Topics  . Smoking status: Never Smoker  . Smokeless tobacco: Never Used  . Alcohol use No     Colonoscopy:  PAP:  Bone density:  Lipid panel:  No Known Allergies  Current Outpatient Prescriptions  Medication Sig Dispense Refill  . acetaminophen (TYLENOL) 325 MG tablet Take 650 mg by mouth every 6 (six) hours as needed.    Marland Kitchen b complex vitamins tablet Take 1 tablet by mouth daily.    . Biotin 1 MG CAPS Take by mouth daily.    . Calcium-Vitamin D 600-200 MG-UNIT tablet Take 1 tablet by mouth daily.    . DULoxetine (CYMBALTA) 60 MG capsule Take 1 capsule by mouth daily.    . furosemide (LASIX) 20 MG tablet Take 1 tablet (20 mg total) by mouth daily. Take 1 or 2 times per day as needed for swelling. 60 tablet 1  . potassium chloride SA (K-DUR,KLOR-CON) 20 MEQ  tablet Take 2 tablets (40 mEq total) by mouth daily. 60 tablet 0  . Vitamin D, Ergocalciferol, (DRISDOL) 50000 units CAPS capsule Take 1 capsule by mouth once a week.     No current facility-administered medications for this visit.    Facility-Administered Medications Ordered in Other Visits  Medication Dose Route Frequency Provider Last Rate Last Dose  . sodium chloride 0.9 % injection 10 mL  10  mL Intracatheter PRN Lloyd Huger, MD   10 mL at 06/13/15 1403  . sodium chloride 0.9 % injection 10 mL  10 mL Intravenous PRN Lloyd Huger, MD   10 mL at 10/30/15 1442    OBJECTIVE: BP 112/76 (BP Location: Left Arm, Patient Position: Sitting)   Pulse 84   Temp 97.6 F (36.4 C) (Tympanic)   Resp 18   Wt 148 lb 13 oz (67.5 kg)   BMI 25.54 kg/m    Body mass index is 25.54 kg/m.    ECOG FS:1 - Symptomatic but completely ambulatory  General: Well-developed, well-nourished, no acute distress. Eyes: Pink conjunctiva, anicteric sclera. Breasts: Patient refused exam today. Lungs: Clear to auscultation bilaterally. Heart: Regular rate and rhythm. No rubs, murmurs, or gallops. Abdomen: Soft, nontender, nondistended. No organomegaly noted, normoactive bowel sounds. Musculoskeletal: No edema, cyanosis, or clubbing. Neuro: Alert, answering all questions appropriately. Cranial nerves grossly intact. Skin: No rashes or petechiae note. Psych: Normal affect.  LAB RESULTS:  Infusion on 06/16/2016  Component Date Value Ref Range Status  . WBC 06/16/2016 4.7  3.6 - 11.0 K/uL Final  . RBC 06/16/2016 3.98  3.80 - 5.20 MIL/uL Final  . Hemoglobin 06/16/2016 13.9  12.0 - 16.0 g/dL Final  . HCT 06/16/2016 39.5  35.0 - 47.0 % Final  . MCV 06/16/2016 99.2  80.0 - 100.0 fL Final  . MCH 06/16/2016 34.8* 26.0 - 34.0 pg Final  . MCHC 06/16/2016 35.1  32.0 - 36.0 g/dL Final  . RDW 06/16/2016 13.6  11.5 - 14.5 % Final  . Platelets 06/16/2016 158  150 - 440 K/uL Final  . Neutrophils Relative % 06/16/2016 53  % Final  . Neutro Abs 06/16/2016 2.5  1.4 - 6.5 K/uL Final  . Lymphocytes Relative 06/16/2016 28  % Final  . Lymphs Abs 06/16/2016 1.3  1.0 - 3.6 K/uL Final  . Monocytes Relative 06/16/2016 10  % Final  . Monocytes Absolute 06/16/2016 0.5  0.2 - 0.9 K/uL Final  . Eosinophils Relative 06/16/2016 8  % Final  . Eosinophils Absolute 06/16/2016 0.4  0 - 0.7 K/uL Final  . Basophils Relative  06/16/2016 1  % Final  . Basophils Absolute 06/16/2016 0.0  0 - 0.1 K/uL Final  . Sodium 06/16/2016 137  135 - 145 mmol/L Final  . Potassium 06/16/2016 3.5  3.5 - 5.1 mmol/L Final  . Chloride 06/16/2016 99* 101 - 111 mmol/L Final  . CO2 06/16/2016 30  22 - 32 mmol/L Final  . Glucose, Bld 06/16/2016 110* 65 - 99 mg/dL Final  . BUN 06/16/2016 16  6 - 20 mg/dL Final  . Creatinine, Ser 06/16/2016 0.50  0.44 - 1.00 mg/dL Final  . Calcium 06/16/2016 9.0  8.9 - 10.3 mg/dL Final  . GFR calc non Af Amer 06/16/2016 >60  >60 mL/min Final  . GFR calc Af Amer 06/16/2016 >60  >60 mL/min Final   Comment: (NOTE) The eGFR has been calculated using the CKD EPI equation. This calculation has not been validated in all clinical situations. eGFR's persistently <60  mL/min signify possible Chronic Kidney Disease.   . Anion gap 06/16/2016 8  5 - 15 Final  . CA 27.29 06/17/2016 21.2  0.0 - 38.6 U/mL Final   Comment: (NOTE) [employer] Cent[employer]/ACS methodology Performed At: [facility] [place], [place] [id number] Lindon Romp MD FW:[id number]    Lab Results  Component Value Date   LABCA2 21.2 06/16/2016    STUDIES: No results found.  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  PLAN:   1. 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 Patient is status post wide excision, MammoSite therapy, chemotherapy with Adriamycin and Cytoxan, as well as 12 weeks of Taxol. Patient completed her chemotherapy on September 17, 2015.  Patient's most recent mammogram on January 29, 2016 was reported as BI-RADS 1, repeat in February 2018. Patient's CA-27-29 also continues to be within normal limits. Return to clinic in 3 months for further evaluation. 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 Currently taking Cymbalta and Gabapentin 100 mg TID managed by neurology.   Patient  expressed understanding and was in agreement with this plan. She also understands that She can call clinic at any time with any questions, concerns, or complaints.    Breast cancer [facility])   Staging form: Breast, AJCC 7th Edition     Clinical stage from 03/26/2015: Stage IA (T1c, N0, M0) - Signed by Lloyd Huger, MD on 03/26/2015   Lloyd Huger, MD   06/23/2016 1:51 PM

## 2016-06-27 ENCOUNTER — Telehealth: Payer: Self-pay | Admitting: *Deleted

## 2016-06-27 NOTE — Telephone Encounter (Signed)
Called patient with results of 27-29.

## 2016-06-29 IMAGING — US US EXTREM LOW VENOUS BILAT
1 series · 13 of 24 positions shown · non-contrast
Comparison: None.

CLINICAL DATA: Pain edema involving the bilateral legs and feet.
History of breast cancer. Evaluate for DVT.



[Series 1: us extrem low venous bilat · 13 of 67 slices shown]
[im 1/67]
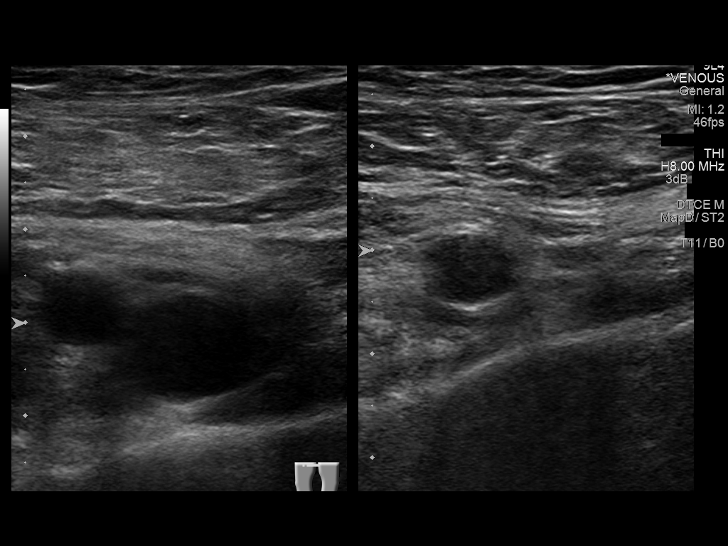
[im 6/67]
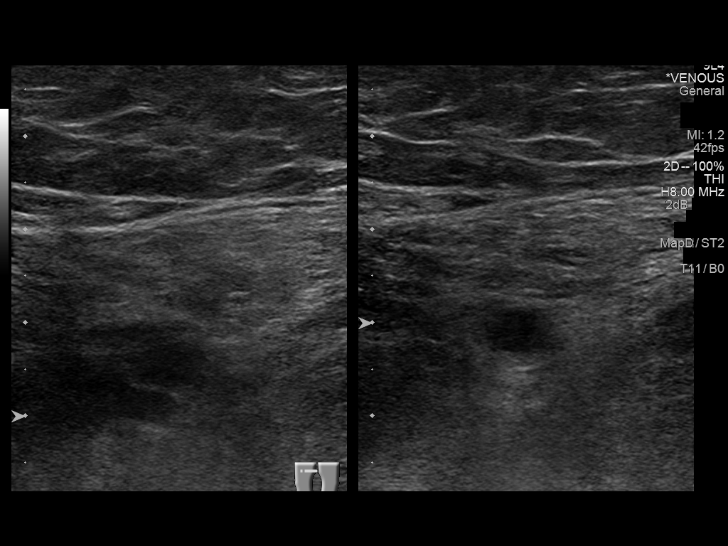
[im 12/67]
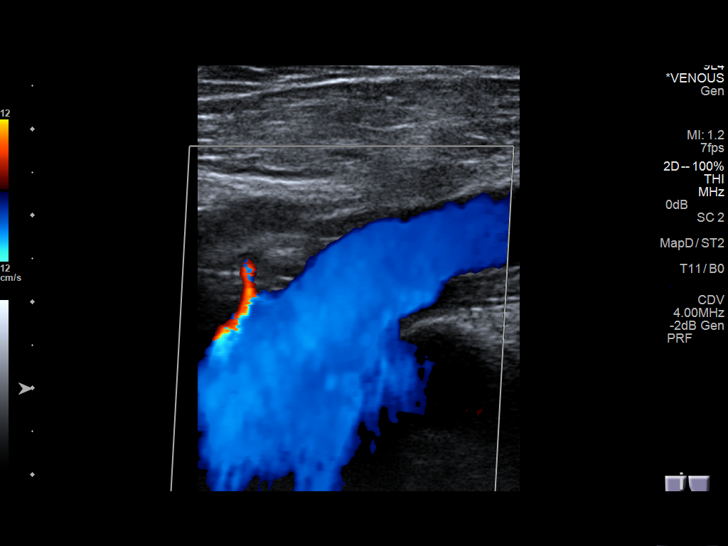
[im 18/67]
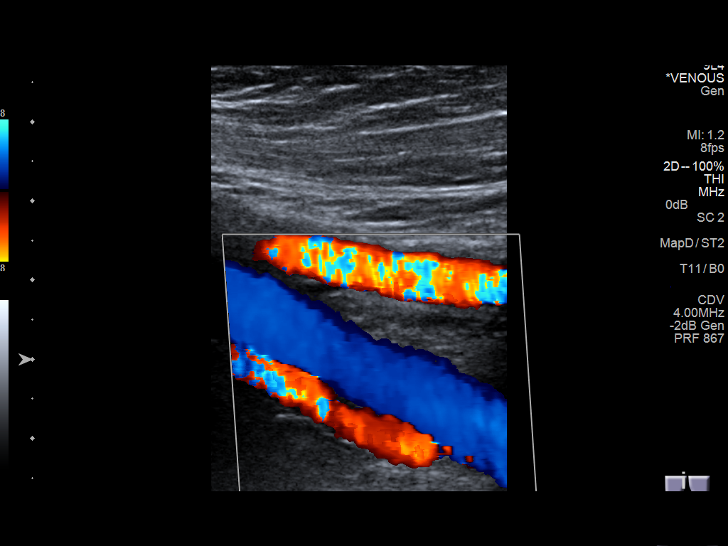
[im 23/67]
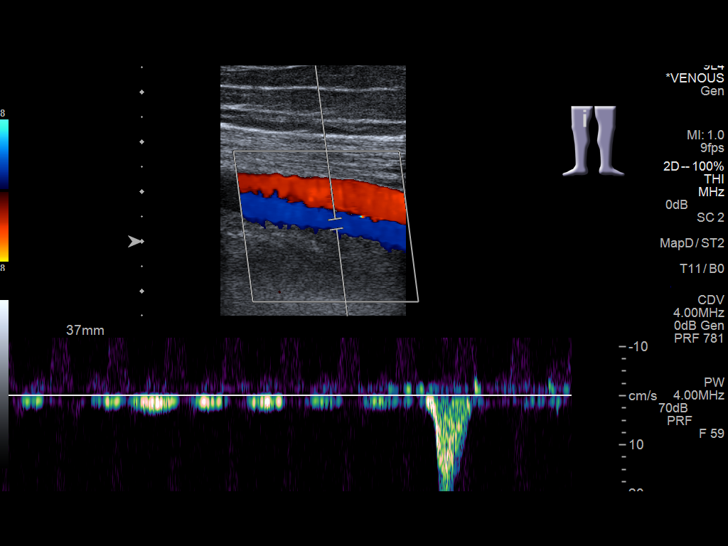
[im 29/67]
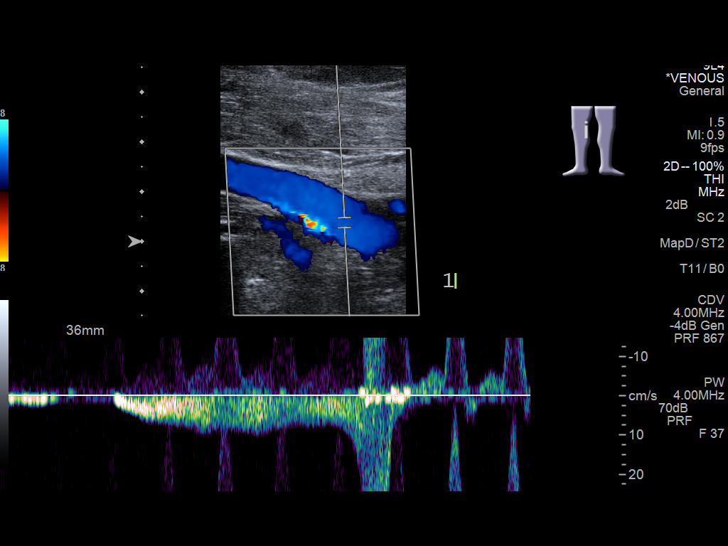
[im 35/67]
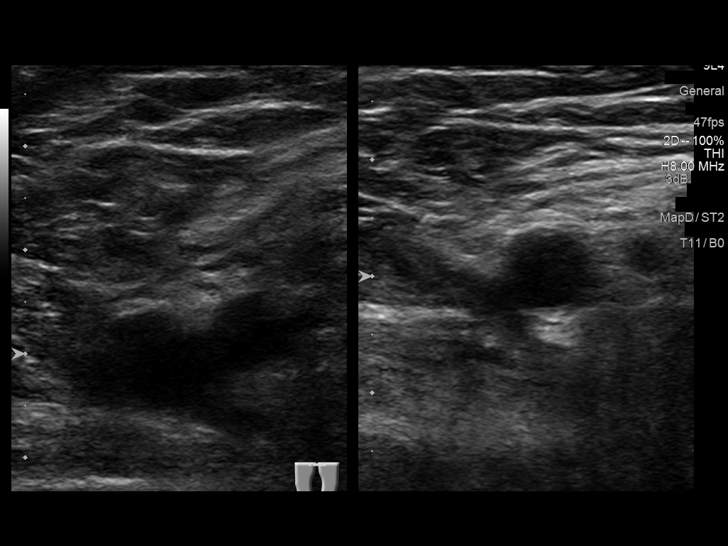
[im 38/67]
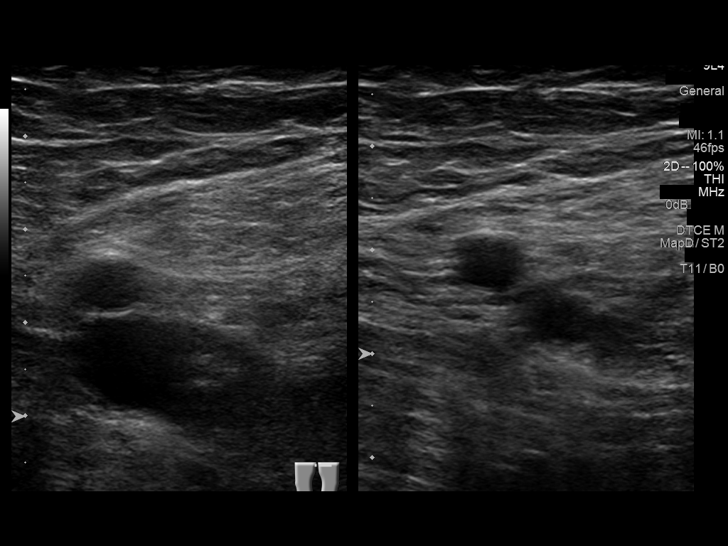
[im 44/67]
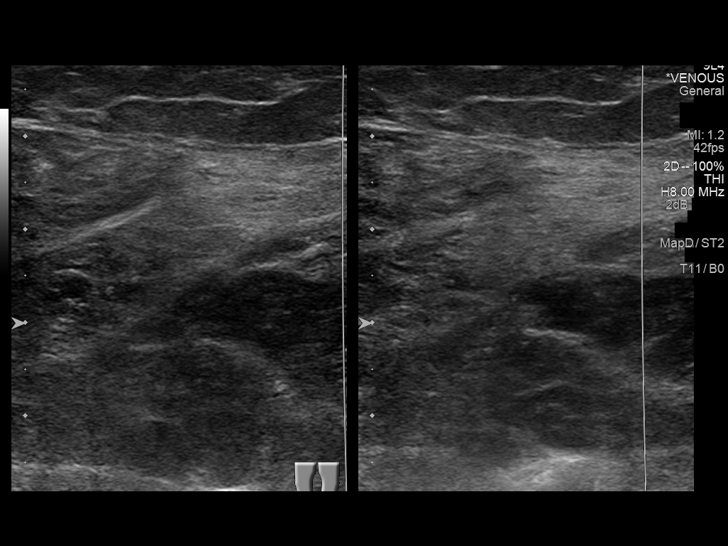
[im 49/67]
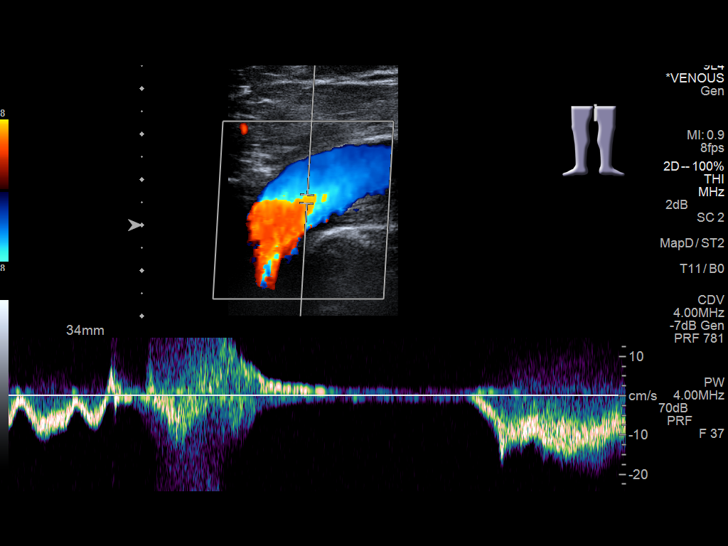
[im 55/67]
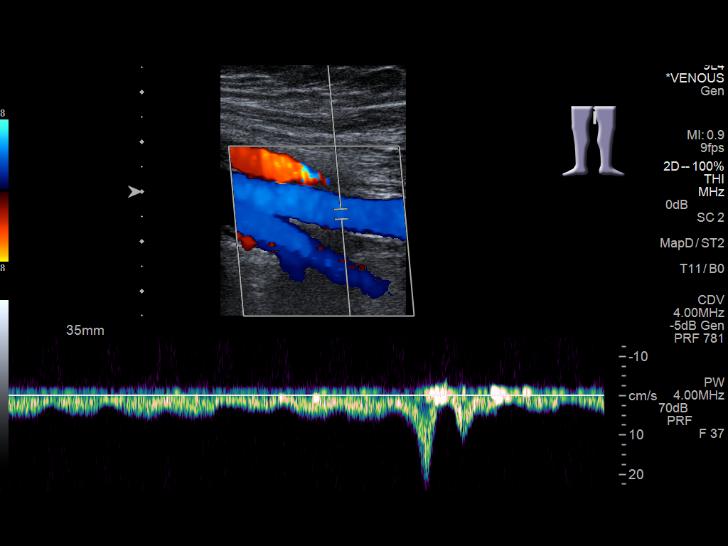
[im 61/67]
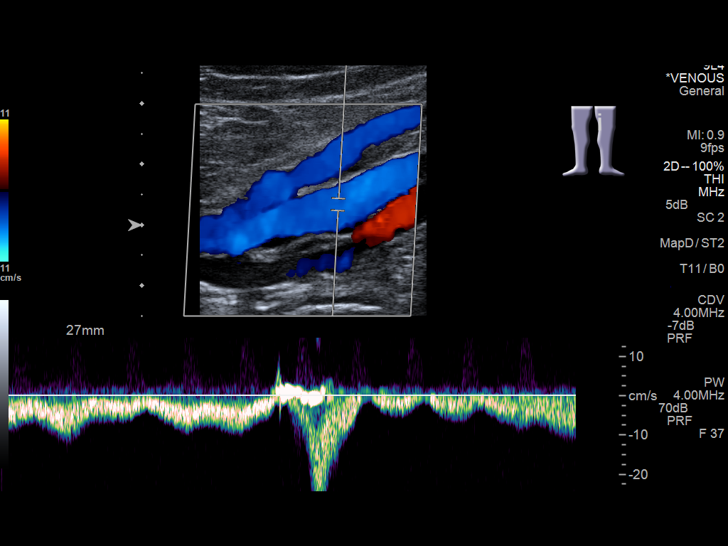
[im 67/67]
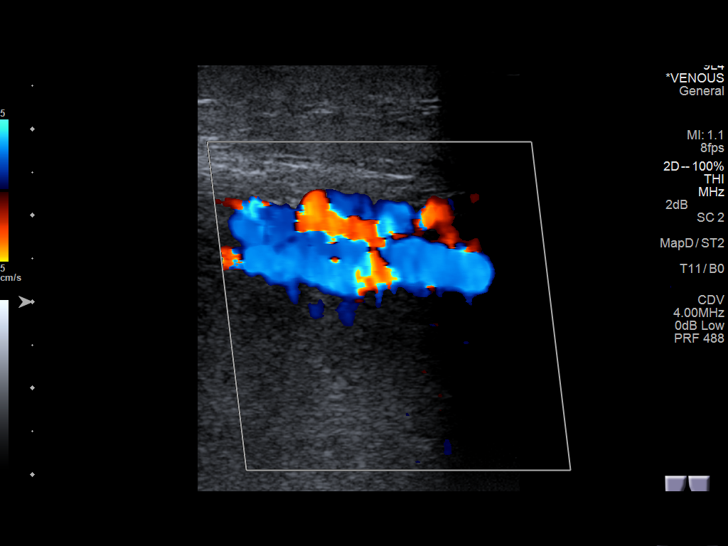

[13 of 24 positions shown; findings below may reference images not displayed]

FINDINGS: RIGHT LOWER EXTREMITY

Common Femoral Vein: No evidence of thrombus. Normal
compressibility, respiratory phasicity and response to augmentation.

Saphenofemoral Junction: No evidence of thrombus. Normal
compressibility and flow on color Doppler imaging.

Profunda Femoral Vein: No evidence of thrombus. Normal
compressibility and flow on color Doppler imaging.

Femoral Vein: No evidence of thrombus. Normal compressibility,
respiratory phasicity and response to augmentation.

Popliteal Vein: No evidence of thrombus. Normal compressibility,
respiratory phasicity and response to augmentation.

Calf Veins: No evidence of thrombus. Normal compressibility and flow
on color Doppler imaging.

Superficial Great Saphenous Vein: No evidence of thrombus. Normal
compressibility and flow on color Doppler imaging.

Venous Reflux:  None.

Other Findings:  None.

LEFT LOWER EXTREMITY

Common Femoral Vein: No evidence of thrombus. Normal
compressibility, respiratory phasicity and response to augmentation.

Saphenofemoral Junction: No evidence of thrombus. Normal
compressibility and flow on color Doppler imaging.

Profunda Femoral Vein: No evidence of thrombus. Normal
compressibility and flow on color Doppler imaging.

Femoral Vein: No evidence of thrombus. Normal compressibility,
respiratory phasicity and response to augmentation.

Popliteal Vein: No evidence of thrombus. Normal compressibility,
respiratory phasicity and response to augmentation.

Calf Veins: No evidence of thrombus. Normal compressibility and flow
on color Doppler imaging.

Superficial Great Saphenous Vein: No evidence of thrombus. Normal
compressibility and flow on color Doppler imaging.

Venous Reflux:  None.

Other Findings:  None.
IMPRESSION: No evidence of DVT within either lower extremity.

## 2016-06-30 ENCOUNTER — Ambulatory Visit (INDEPENDENT_AMBULATORY_CARE_PROVIDER_SITE_OTHER): Payer: 59 | Admitting: Family Medicine

## 2016-06-30 ENCOUNTER — Encounter: Payer: Self-pay | Admitting: Family Medicine

## 2016-06-30 DIAGNOSIS — M810 Age-related osteoporosis without current pathological fracture: Secondary | ICD-10-CM | POA: Diagnosis not present

## 2016-06-30 DIAGNOSIS — C50919 Malignant neoplasm of unspecified site of unspecified female breast: Secondary | ICD-10-CM | POA: Diagnosis not present

## 2016-06-30 DIAGNOSIS — G620 Drug-induced polyneuropathy: Secondary | ICD-10-CM

## 2016-06-30 DIAGNOSIS — T451X5A Adverse effect of antineoplastic and immunosuppressive drugs, initial encounter: Secondary | ICD-10-CM | POA: Diagnosis not present

## 2016-06-30 NOTE — Progress Notes (Signed)
Pre visit review using our clinic review tool, if applicable. No additional management support is needed unless otherwise documented below in the visit note. 

## 2016-06-30 NOTE — Assessment & Plan Note (Signed)
Patient with osteoporosis with T score of -2.8. Discussed options for treatment including Fosamax, zoledronic acid, and Prolia. Advised that Fosamax would likely be the first line and that likely her insurance would potentially not cover the other 2 without trying Fosamax. She is provided with information for all 3 to review to see if she would like to proceed with treatment.

## 2016-06-30 NOTE — Progress Notes (Signed)
Wanda Rumps, MD Phone: [phone]  Wanda Lopez is a 55 y.o. female who presents today for new patient visit.  Osteoporosis: Recently found to have osteoporosis on DEXA scan. No prior history of fracture. Takes calcium and vitamin D. Denies history of reflux. Does note a family history of osteoporosis. No prior treatment for this.  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 Is followed by neurology for this. Currently taking Cymbalta though is unsure if this helps. Notes the symptoms are tingling and numbness in her toes and the top of her feet and also somewhat in her fingertips. Has been stable for some time.  History of breast cancer: Status post lumpectomy in 2016. 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 Underwent chemotherapy and radiation. Had recent mammogram that was benign. Follows with oncology for this.  Active Ambulatory Problems    Diagnosis Date Noted  . Breast cancer ([place]) 03/07/2015  . Malignant neoplasm of lower-outer quadrant of right female breast ([place]) 12/17/2015  . 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 Osteoporosis 06/30/2016   Resolved Ambulatory Problems    Diagnosis Date Noted  . Breast cancer, stage 1 ([place]) 02/14/2015  . Dehydration 05/28/2015   Past Medical History:  Diagnosis Date  . Breast cancer ([place]) 2016  . Breast cancer of lower-outer quadrant of right female breast ([place]) 02/09/2015  . Dehydration 05/28/2015  . Fainting spell   . Malignant neoplasm of lower-outer quadrant of right female breast ([place]) 12/17/2015  . Migraines     Family History  Problem Relation Age of Onset  . Hypertension Mother   . Heart failure Mother   . Osteoarthritis Mother   . Stroke Mother   . Hypertension Father   . Diabetes Father   . Osteoporosis Sister   . Epilepsy Sister     Social History   Social History  . Marital status: Married    Spouse name: N/A  . Number of children: N/A  . Years of education: N/A    Occupational History  . Not on file.   Social History Main Topics  . Smoking status: Never Smoker  . Smokeless tobacco: Never Used  . Alcohol use No  . Drug use: No  . Sexual activity: Not on file   Other Topics Concern  . Not on file   Social History Narrative  . No narrative on file    ROS  General:  Negative for nexplained weight loss, fever Skin: Negative for new or changing mole, sore that won't heal HEENT: Negative for trouble hearing, trouble seeing, ringing in ears, mouth sores, hoarseness, change in voice, dysphagia. CV:  Negative for chest pain, dyspnea, edema, palpitations Resp: Negative for cough, dyspnea, hemoptysis GI: Negative for nausea, vomiting, diarrhea, constipation, abdominal pain, melena, hematochezia. GU: Negative for dysuria, incontinence, urinary hesitance, hematuria, vaginal or penile discharge, polyuria, sexual difficulty, lumps in testicle or breasts MSK: Negative for muscle cramps or aches, joint pain or swelling Neuro: Positive for numbness, Negative for headaches, weakness, dizziness, passing out/fainting Psych: Negative for depression, anxiety, memory problems  Objective  Physical Exam Vitals:   06/30/16 0911  BP: 118/66  Pulse: 82  Temp: 98.2 F (36.8 C)    BP Readings from Last 3 Encounters:  06/30/16 118/66  06/16/16 112/76  03/17/16 114/76   Wt Readings from Last 3 Encounters:  06/30/16 149 lb 6.4 oz (67.8 kg)  06/16/16 148 lb 13 oz (67.5 kg)  03/17/16 148 lb 5.9 oz (67.3 kg)    Physical Exam  Constitutional: No distress.  HENT:  Head: Normocephalic and atraumatic.  Mouth/Throat: Oropharynx is clear and moist.  Eyes: Conjunctivae are normal. Pupils are equal, round, and reactive to light.  Cardiovascular: Normal rate, regular rhythm and normal heart sounds.   Pulmonary/Chest: Effort normal and breath sounds normal.  Abdominal: Soft. Bowel sounds are normal. She exhibits no distension. There is no tenderness. There is  no rebound and no guarding.  Musculoskeletal: She exhibits no edema.  Neurological: She is alert. Gait normal.  CN 2-12 intact, 5/5 strength in bilateral biceps, triceps, grip, quads, hamstrings, plantar and dorsiflexion, sensation to light touch intact in bilateral UE and LE, normal gait, 2+ patellar reflexes  Skin: Skin is warm and dry. She is not diaphoretic.  Psychiatric: Mood and affect normal.     Assessment/Plan:   Breast cancer Overall doing well status post surgery, chemotherapy, and radiation. She will continue to follow with oncology for this.  Neuropathy due to chemotherapeutic drug ([place]) Related to Taxol therapy. She'll continue Cymbalta at this time. She'll follow-up with her neurologist as scheduled.  Osteoporosis Patient with osteoporosis with T score of -2.8. Discussed options for treatment including Fosamax, zoledronic acid, and Prolia. Advised that Fosamax would likely be the first line and that likely her insurance would potentially not cover the other 2 without trying Fosamax. She is provided with information for all 3 to review to see if she would like to proceed with treatment.  Wanda Rumps, MD [facility]

## 2016-06-30 NOTE — Patient Instructions (Signed)
Nice to meet you. I have provided you with information on 3 different medications to treat osteoporosis. Please review these and let us know if he would like to proceed with one of them. Be aware that your insurance company may not cover the 2 IV or injectable options until we have tried Fosamax.  Please continue follow-up with neurology for your neuropathy. Please continue to see the oncologist for your breast cancer history.

## 2016-06-30 NOTE — Assessment & Plan Note (Signed)
Related to Taxol therapy. She'll continue Cymbalta at this time. She'll follow-up with her neurologist as scheduled.

## 2016-06-30 NOTE — Assessment & Plan Note (Signed)
Overall doing well status post surgery, chemotherapy, and radiation. She will continue to follow with oncology for this.

## 2016-07-07 ENCOUNTER — Other Ambulatory Visit: Payer: Self-pay | Admitting: Oncology

## 2016-07-07 DIAGNOSIS — C50511 Malignant neoplasm of lower-outer quadrant of right female breast: Secondary | ICD-10-CM

## 2016-08-05 ENCOUNTER — Other Ambulatory Visit: Payer: Self-pay | Admitting: Oncology

## 2016-08-05 ENCOUNTER — Other Ambulatory Visit: Payer: Self-pay | Admitting: *Deleted

## 2016-08-05 DIAGNOSIS — C50511 Malignant neoplasm of lower-outer quadrant of right female breast: Secondary | ICD-10-CM

## 2016-08-05 MED ORDER — POTASSIUM CHLORIDE CRYS ER 20 MEQ PO TBCR: 20.0000 meq | EXTENDED_RELEASE_TABLET | Freq: Every day | ORAL | 1 refills | Status: DC

## 2016-08-06 ENCOUNTER — Inpatient Hospital Stay: Payer: 59 | Attending: Oncology

## 2016-08-06 DIAGNOSIS — Z9221 Personal history of antineoplastic chemotherapy: Secondary | ICD-10-CM | POA: Diagnosis not present

## 2016-08-06 DIAGNOSIS — Z171 Estrogen receptor negative status [ER-]: Secondary | ICD-10-CM | POA: Diagnosis not present

## 2016-08-06 DIAGNOSIS — Z452 Encounter for adjustment and management of vascular access device: Secondary | ICD-10-CM | POA: Insufficient documentation

## 2016-08-06 DIAGNOSIS — C50511 Malignant neoplasm of lower-outer quadrant of right female breast: Secondary | ICD-10-CM

## 2016-08-06 MED ORDER — SODIUM CHLORIDE 0.9% FLUSH
10.0000 mL | INTRAVENOUS | Status: DC | PRN
  Administered 2016-08-06: 10 mL via INTRAVENOUS
  Filled 2016-08-06: qty 10

## 2016-08-06 MED ORDER — HEPARIN SOD (PORK) LOCK FLUSH 100 UNIT/ML IV SOLN
500.0000 [IU] | Freq: Once | INTRAVENOUS | Status: AC
  Administered 2016-08-06: 500 [IU] via INTRAVENOUS

## 2016-08-12 IMAGING — US US BIOPSY BREAST CORE W/ IMAGING
1 series · 10 of 10 positions shown · non-contrast
Comparison: Previous exams.

CLINICAL DATA: 53-year-old female for tissue sampling of suspicious
2 cm mass at the 8 o'clock position of the right breast.

EXAM:
ULTRASOUND GUIDED RIGHT BREAST CORE NEEDLE BIOPSY

[Series 1: us biopsy breast core w/ imaging · 0.08mm/px · 10 of 10 slices shown]
[im 1/10]
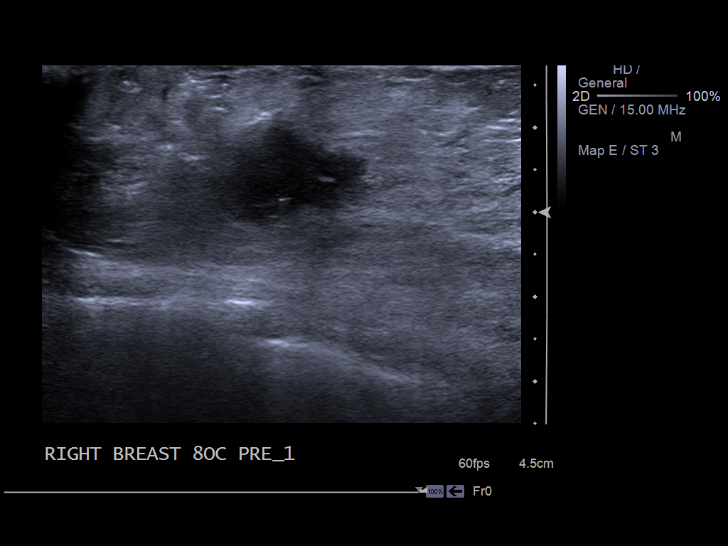
[im 2/10]
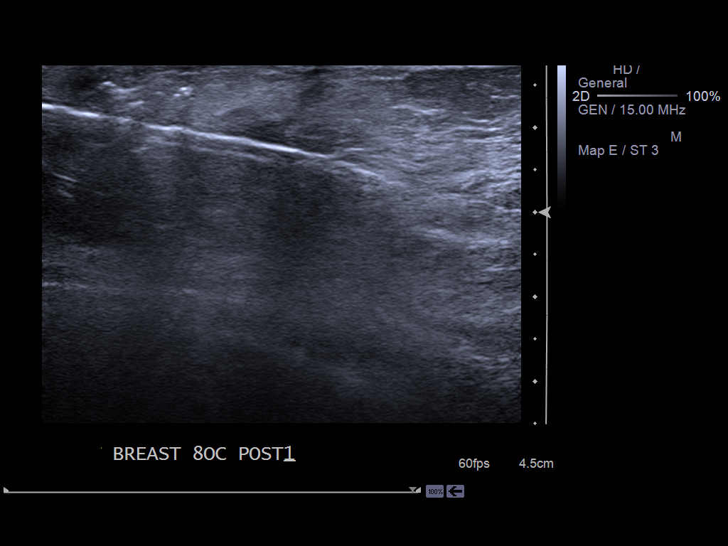
[im 3/10]
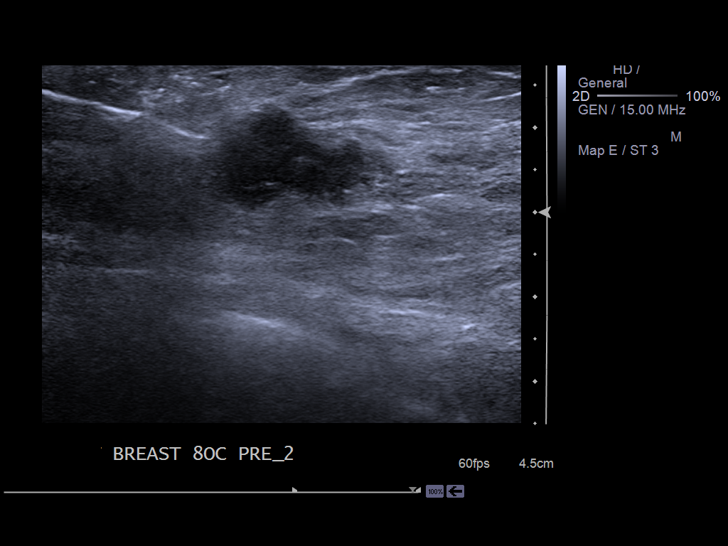
[im 4/10]
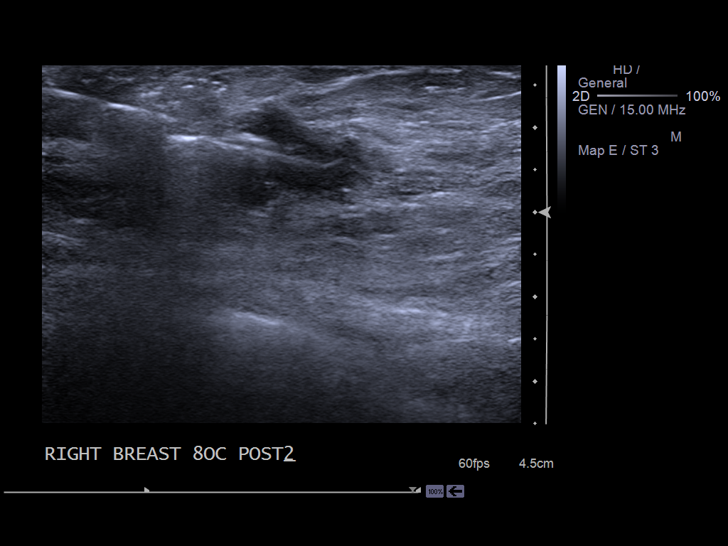
[im 5/10]
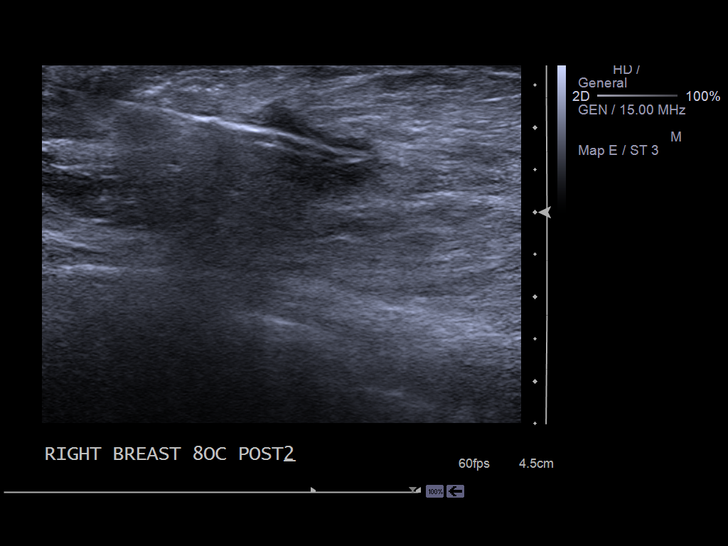
[im 6/10]
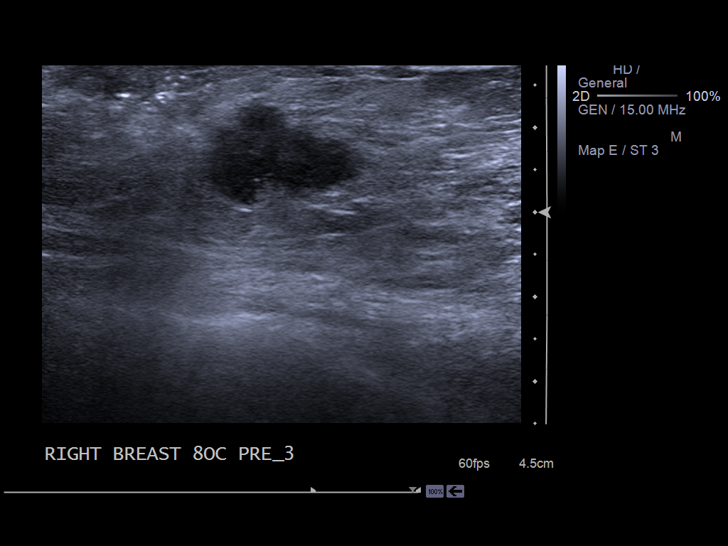
[im 7/10]
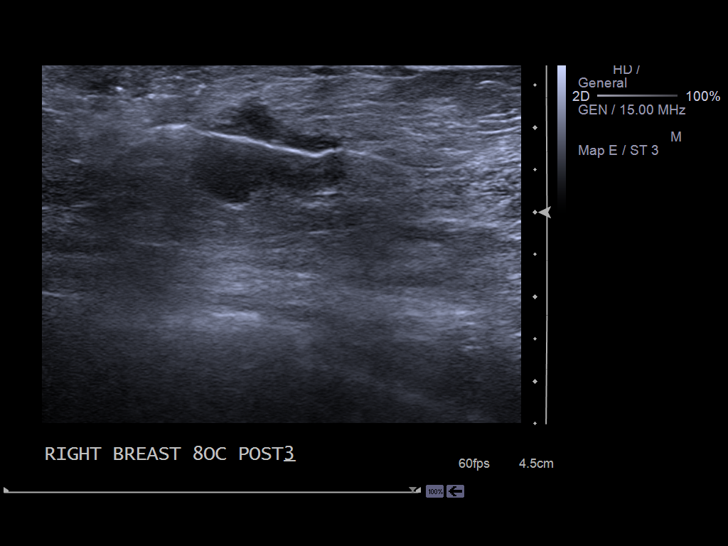
[im 8/10]
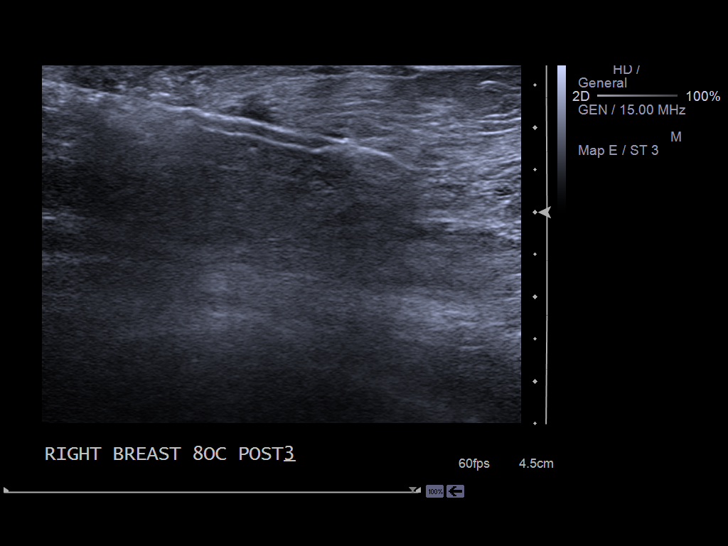
[im 9/10]
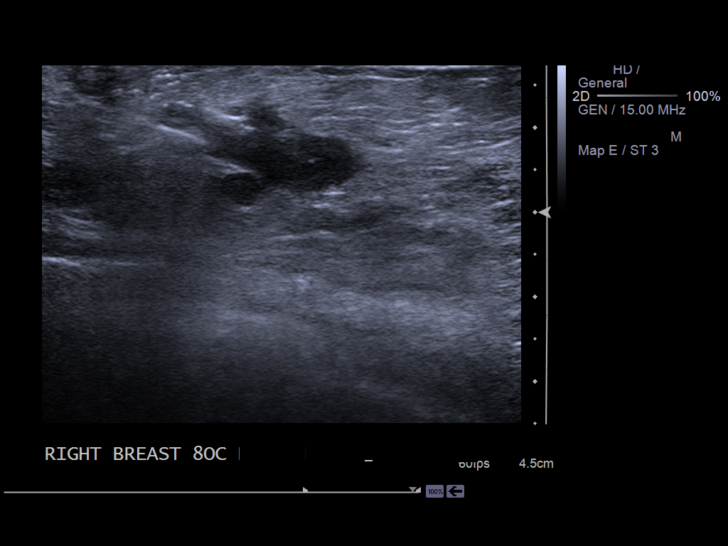
[im 10/10]
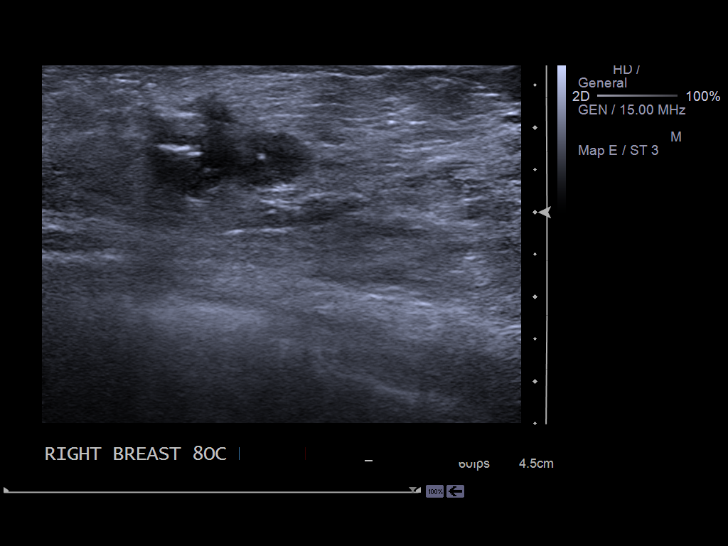

[10 of 10 positions shown; findings below may reference images not displayed]

PROCEDURE:
I met with the patient and we discussed the procedure of
ultrasound-guided biopsy, including benefits and alternatives. We
discussed the high likelihood of a successful procedure. We
discussed the risks of the procedure including infection, bleeding,
tissue injury, clip migration, and inadequate sampling. Informed
written consent was given. The usual time-out protocol was performed
immediately prior to the procedure.

Using sterile technique and 2% Lidocaine as local anesthetic, under
direct ultrasound visualization, a 12 gauge vacuum-assisted device
was used to perform biopsy of the 2 cm hypoechoic mass at the 8
o'clock position of the right breast 8 cm from the nipple using a
lateral approach. At the conclusion of the procedure, a ribbon
shaped tissue marker clip was deployed into the biopsy cavity.
Follow-up 2-view mammogram was performed and dictated separately.
IMPRESSION: Ultrasound-guided biopsy of lower outer right breast mass. No
apparent complications.

Pathology will be followed.

## 2016-09-15 NOTE — Progress Notes (Signed)
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  INTERVAL HISTORY:   Patient returns to clinic toay for routine 3 month follow-up  She continues to have a mild peripheral neuropathy that is being managed by neurology with gabapentin.She is considering discontinuing gabapentin. She otherwise feels well and is asymptomatic. She does not complain of weakness or fatigue today. She does not complain of dizziness today. She has no other neurologic complaints. She denies any recent fevers. She denies any chest pain or shortness of breath. She denies any vomiting, constipation, or diarrhea. She has no urinary complaints. Patient offers no further specific complaints today.  REVIEW OF SYSTEMS:   Review of Systems  Constitutional: Negative.  Negative for fever, malaise/fatigue and weight loss.  Respiratory: Negative for shortness of breath.   Cardiovascular: Negative.  Negative for chest pain.  Gastrointestinal: Negative.   Genitourinary: Negative.   Musculoskeletal: Negative.   Neurological: Positive for sensory change. Negative for weakness.  Psychiatric/Behavioral: Negative.  The patient is not nervous/anxious.    As per HPI. Otherwise, a complete review of systems is negative.   PAST MEDICAL HISTORY: Past Medical History:  Diagnosis Date  . Breast cancer ([facility]) 2016   right breast  . Breast cancer of lower-outer quadrant of right female breast ([place]) 02/09/2015   1.7 triple negative, medullary carcinoma. Wide excision, sentinel node biopsy.  .  Dehydration 05/28/2015  . Fainting spell   . Malignant neoplasm of lower-outer quadrant of right female breast ([place]) 12/17/2015  . Migraines     PAST SURGICAL HISTORY: Past Surgical History:  Procedure Laterality Date  . BREAST EXCISIONAL BIOPSY Right 02-21-15   wide excision/sentinel node biopsy.  . COLONOSCOPY  2013   Dr Vira Agar  . PORTACATH PLACEMENT  03-29-15   Dr Bary Castilla    FAMILY HISTORY Family History  Problem Relation Age of Onset  . Hypertension Mother   . Heart failure Mother   . Osteoarthritis Mother   . Stroke Mother   . Hypertension Father   . Diabetes Father   . Osteoporosis Sister   . Epilepsy Sister     GYNECOLOGIC HISTORY:  No LMP recorded. Patient is not currently having periods (Reason: Chemotherapy).     ADVANCED DIRECTIVES:    HEALTH MAINTENANCE: Social History  Substance Use Topics  . Smoking status: Never Smoker  . Smokeless tobacco: Never Used  . Alcohol use No     Colonoscopy:  PAP:  Bone density:  Lipid panel:  No Known Allergies  Current Outpatient Prescriptions  Medication Sig Dispense Refill  . b complex vitamins tablet Take 1 tablet by mouth daily.    . Biotin 1 MG CAPS Take by mouth daily.    . Calcium-Vitamin D 600-200 MG-UNIT tablet Take 1 tablet by mouth daily.    . DULoxetine (CYMBALTA) 20 MG capsule Take 20 mg by mouth daily.    . furosemide (LASIX) 20 MG tablet TAKE 1 TABLET (20 MG TOTAL) BY MOUTH DAILY. TAKE 1 OR 2 TIMES PER DAY AS NEEDED FOR SWELLING. 60 tablet 1  . potassium chloride SA (KLOR-CON M20) 20 MEQ tablet  Take 1 tablet (20 mEq total) by mouth daily. 30 tablet 1  . vitamin E 1000 UNIT capsule Take 1,000 Units by mouth daily.     No current facility-administered medications for this visit.    Facility-Administered Medications Ordered in Other Visits  Medication Dose Route Frequency Provider Last Rate Last Dose  . sodium chloride 0.9 % injection 10 mL  10 mL Intracatheter PRN Lloyd Huger, MD   10 mL at  06/13/15 1403  . sodium chloride 0.9 % injection 10 mL  10 mL Intravenous PRN Lloyd Huger, MD   10 mL at 10/30/15 1442    OBJECTIVE: BP 102/69 (BP Location: Left Arm, Patient Position: Sitting)   Pulse 79   Temp 97.5 F (36.4 C) (Oral)   Resp 18   Wt 149 lb 0.5 oz (67.6 kg)   BMI 25.19 kg/m    Body mass index is 25.19 kg/m.    ECOG FS:1 - Symptomatic but completely ambulatory  General: Well-developed, well-nourished, no acute distress. Eyes: Pink conjunctiva, anicteric sclera. Breasts: Patient refused exam today. Lungs: Clear to auscultation bilaterally. Heart: Regular rate and rhythm. No rubs, murmurs, or gallops. Abdomen: Soft, nontender, nondistended. No organomegaly noted, normoactive bowel sounds. Musculoskeletal: No edema, cyanosis, or clubbing. Neuro: Alert, answering all questions appropriately. Cranial nerves grossly intact. Skin: No rashes or petechiae note. Psych: Normal affect.  LAB RESULTS:  Appointment on 09/16/2016  Component Date Value Ref Range Status  . CA 27.29 09/17/2016 25.8  0.0 - 38.6 U/mL Final   Comment: (NOTE) [employer] [facility]/ACS methodology Performed At: [facility] [place], [place] [id number] Lindon Romp MD [id number]    Lab Results  Component Value Date   LABCA2 25.8 09/16/2016    STUDIES: No results found.  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  PLAN:   1. 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 Patient is status post wide excision, MammoSite therapy, chemotherapy with Adriamycin and Cytoxan, as well as 12 weeks of Taxol. Patient completed her chemotherapy on September 17, 2015.  Patient's most recent mammogram on January 29, 2016 was reported as BI-RADS 1, repeat in February 2018. Patient's CA-27-29 also continues to be within normal limits. Return to clinic in 6 months for further  evaluation. 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 Patient has discontinued Cymbalta and is considering discontinuing gabapentin as well. Continue follow-up and management per neurology.   Patient expressed understanding and was in agreement with this plan. She also understands that She can call clinic at any time with any questions, concerns, or complaints.    Breast cancer [facility])   Staging form: Breast, AJCC 7th Edition     Clinical stage from 03/26/2015: Stage IA (T1c, N0, M0) - Signed by Lloyd Huger, MD on 03/26/2015   Lloyd Huger, MD   09/19/2016 3:53 PM

## 2016-09-16 ENCOUNTER — Inpatient Hospital Stay: Payer: 59 | Attending: Oncology

## 2016-09-16 ENCOUNTER — Inpatient Hospital Stay (HOSPITAL_BASED_OUTPATIENT_CLINIC_OR_DEPARTMENT_OTHER): Payer: 59 | Admitting: Oncology

## 2016-09-16 VITALS — BP 102/69 | HR 79 | Temp 97.5°F | Resp 18 | Wt 149.0 lb

## 2016-09-16 DIAGNOSIS — Z79899 Other long term (current) drug therapy: Secondary | ICD-10-CM | POA: Insufficient documentation

## 2016-09-16 DIAGNOSIS — G629 Polyneuropathy, unspecified: Secondary | ICD-10-CM | POA: Diagnosis not present

## 2016-09-16 DIAGNOSIS — C50511 Malignant neoplasm of lower-outer quadrant of right female breast: Secondary | ICD-10-CM | POA: Insufficient documentation

## 2016-09-16 DIAGNOSIS — Z9221 Personal history of antineoplastic chemotherapy: Secondary | ICD-10-CM

## 2016-09-16 DIAGNOSIS — Z171 Estrogen receptor negative status [ER-]: Secondary | ICD-10-CM | POA: Insufficient documentation

## 2016-09-16 NOTE — Progress Notes (Signed)
States is feeling well. Continues to have neuropathy in both feet but has not gotten worse since last visit.

## 2016-09-17 LAB — CANCER ANTIGEN 27.29: CA 27.29: 25.8 U/mL (ref 0.0–38.6)

## 2016-09-23 ENCOUNTER — Other Ambulatory Visit: Payer: Self-pay | Admitting: *Deleted

## 2016-09-23 MED ORDER — FUROSEMIDE 20 MG PO TABS: 20.0000 mg | ORAL_TABLET | Freq: Every day | ORAL | 0 refills | Status: DC

## 2016-09-23 NOTE — Telephone Encounter (Signed)
Do you want to refill Lasix or let her PCP handle this?

## 2016-09-29 IMAGING — CR DG CHEST 1V PORT
1 series · 1 of 1 positions shown · non-contrast
Comparison: None.

CLINICAL DATA: Port-A-Cath placement

EXAM:
PORTABLE CHEST - 1 VIEW

[ap]
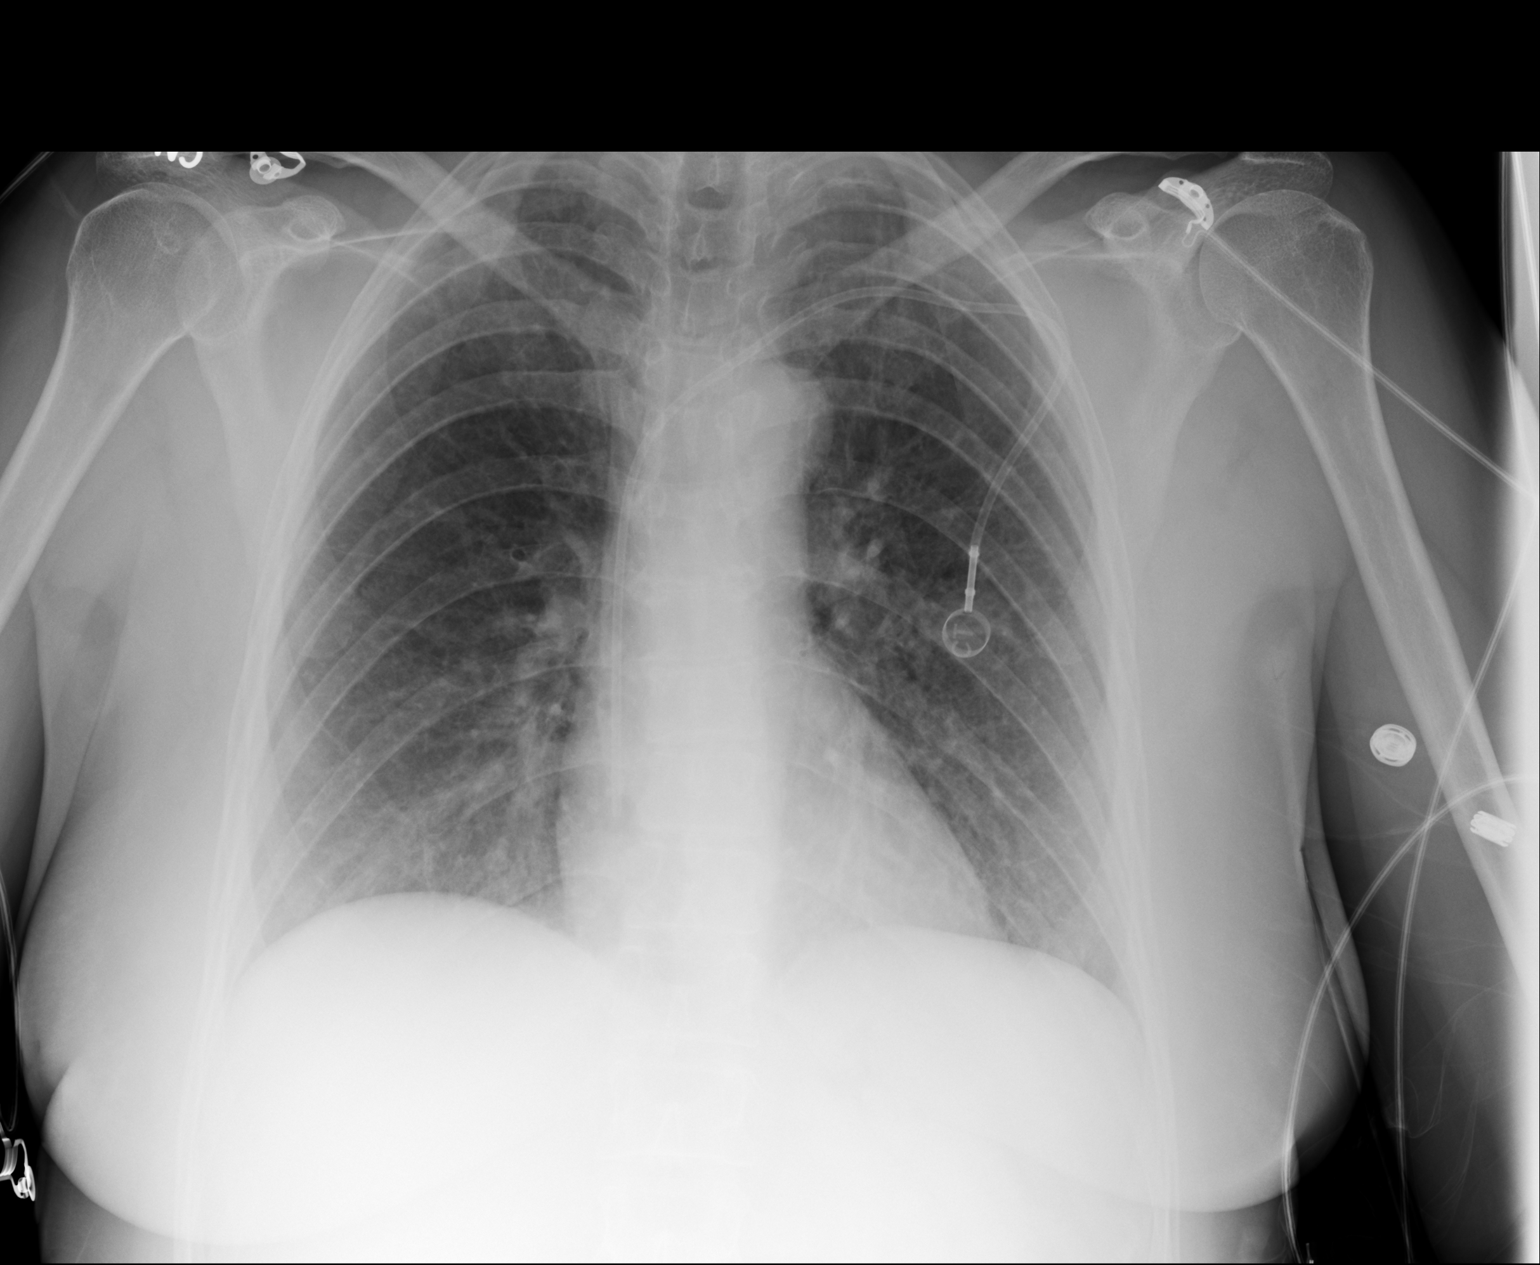

[1 of 1 positions shown; findings below may reference images not displayed]

FINDINGS: Port-A-Cath tip is in the right atrium slightly beyond the
cavoatrial junction. No pneumothorax. Lungs are clear. Heart size
and pulmonary vascularity are normal. No adenopathy. No bone
lesions.
IMPRESSION: Port-A-Cath tip is in the right atrium without pneumothorax. No
edema or consolidation.

## 2016-10-01 ENCOUNTER — Other Ambulatory Visit: Payer: Self-pay | Admitting: *Deleted

## 2016-10-03 ENCOUNTER — Telehealth: Payer: Self-pay | Admitting: *Deleted

## 2016-10-03 MED ORDER — POTASSIUM CHLORIDE CRYS ER 20 MEQ PO TBCR: 20.0000 meq | EXTENDED_RELEASE_TABLET | Freq: Two times a day (BID) | ORAL | 1 refills | Status: DC

## 2016-10-03 NOTE — Telephone Encounter (Signed)
Patient called in today to question Dr. Grayland Ormond refusing to prescribe potassium. I explained to patient that since she is no longer under active treatment and is now seen in routine follow up that it would be better for PCP to manage potassium prescription. Patient states that she does not really have PCP but she can try to get in with someone and get established to manage her Lasix and Potassium. I advised her we could send in 1 month refill for potassium since she is almost out of medication and still taking lasix. Patient verbalized understanding and states she will call Dr. Tharon Aquas office for further follow up and refills. Patient also questioning port removal appointment with Dr. Bary Castilla, orders have been sent for port removal.

## 2016-10-06 ENCOUNTER — Telehealth: Payer: Self-pay | Admitting: *Deleted

## 2016-10-06 NOTE — Telephone Encounter (Signed)
Spoke with patient and scheduled her for a follow up on 10/14/16. I told her that you could discuss the different medications on that day. It is time for her follow up

## 2016-10-06 NOTE — Telephone Encounter (Signed)
We will discuss medications at follow-up.

## 2016-10-06 NOTE — Telephone Encounter (Signed)
Pt would like to have Dr. Caryl Bis refill her Klor-Con in the future. Pt stated that she has a refill now.  Pt also has questions about fosamax-her requested to discuss this, the side effects gave her concerns.  Pt contact [phone]

## 2016-10-14 ENCOUNTER — Encounter: Payer: Self-pay | Admitting: Family Medicine

## 2016-10-14 ENCOUNTER — Ambulatory Visit (INDEPENDENT_AMBULATORY_CARE_PROVIDER_SITE_OTHER): Payer: 59 | Admitting: Family Medicine

## 2016-10-14 VITALS — BP 100/72 | HR 80 | Temp 97.7°F | Resp 16 | Wt 152.5 lb

## 2016-10-14 DIAGNOSIS — T451X5A Adverse effect of antineoplastic and immunosuppressive drugs, initial encounter: Secondary | ICD-10-CM | POA: Diagnosis not present

## 2016-10-14 DIAGNOSIS — M810 Age-related osteoporosis without current pathological fracture: Secondary | ICD-10-CM | POA: Diagnosis not present

## 2016-10-14 DIAGNOSIS — R600 Localized edema: Secondary | ICD-10-CM | POA: Diagnosis not present

## 2016-10-14 DIAGNOSIS — G620 Drug-induced polyneuropathy: Secondary | ICD-10-CM | POA: Diagnosis not present

## 2016-10-14 LAB — COMPREHENSIVE METABOLIC PANEL
ALT: 11 U/L (ref 0–35)
AST: 22 U/L (ref 0–37)
Albumin: 4.3 g/dL (ref 3.5–5.2)
Alkaline Phosphatase: 104 U/L (ref 39–117)
BILIRUBIN TOTAL: 0.6 mg/dL (ref 0.2–1.2)
BUN: 12 mg/dL (ref 6–23)
CHLORIDE: 99 meq/L (ref 96–112)
CO2: 31 meq/L (ref 19–32)
Calcium: 9.9 mg/dL (ref 8.4–10.5)
Creatinine, Ser: 0.52 mg/dL (ref 0.40–1.20)
GFR: 129.5 mL/min (ref >60.00)
GLUCOSE: 72 mg/dL (ref 70–99)
POTASSIUM: 3.6 meq/L (ref 3.5–5.1)
Sodium: 139 mEq/L (ref 135–145)
Total Protein: 7.7 g/dL (ref 6.0–8.3)

## 2016-10-14 LAB — TSH: TSH: 1.54 u[IU]/mL (ref 0.35–4.50)

## 2016-10-14 MED ORDER — FUROSEMIDE 20 MG PO TABS: 20.0000 mg | ORAL_TABLET | Freq: Every day | ORAL | 0 refills | Status: DC

## 2016-10-14 MED ORDER — POTASSIUM CHLORIDE CRYS ER 20 MEQ PO TBCR: 20.0000 meq | EXTENDED_RELEASE_TABLET | Freq: Two times a day (BID) | ORAL | 1 refills | Status: DC

## 2016-10-14 NOTE — Progress Notes (Signed)
Pre visit review using our clinic review tool, if applicable. No additional management support is needed unless otherwise documented below in the visit note. 

## 2016-10-14 NOTE — Assessment & Plan Note (Signed)
Stable. She'll continue Cymbalta and she will discuss tapering off of this with her neurologist.

## 2016-10-14 NOTE — Assessment & Plan Note (Signed)
Patient with recurrent issues with lower extremity edema since being on chemotherapy. Unsure if it is related to this. She was on a chemotherapeutic agent that could cause cardiac dysfunction though she has no cardiac symptoms other than swelling. We'll check some lab work. She will stop her Lasix and see if the edema recurs. If it does would plan on obtaining an echo to evaluate further. If does not recur will continue to monitor.

## 2016-10-14 NOTE — Progress Notes (Signed)
  [name], MD Phone: [phone]  Wanda Lopez is a 55 y.o. female who presents today for follow-up.  Lower extremity edema: Patient notes she's had intermittent issues with lower extremity edema since having her chemotherapy. She notes no orthopnea or PND. No shortness of breath. No history of anemia, thyroid dysfunction, kidney dysfunction, or liver dysfunction. She had an EF of 67% prior to starting on chemotherapy. They placed her on Lasix for this. She has tried to come off of it previously though had some recurrence of mild pedal edema. She takes KCl supplement as well.  Neuropathy: States this is stable. This is related to her chemotherapy. Follows with neurology. She's unsure of Cymbalta has been of benefit. She wants to discuss with them whether or not she can back off slowly of the Cymbalta.  Osteoporosis: Takes calcium and vitamin D. She reviewed the information given to her previously on treatment regimens though she is unsure what she wants to do. She is concerned about risk of side effects. No history of fracture. Reports strong family history of osteoporosis. No reflux symptoms.  PMH: nonsmoker.   ROS see history of present illness  Objective  Physical Exam Vitals:   10/14/16 1115  BP: 100/72  Pulse: 80  Resp: 16  Temp: 97.7 F (36.5 C)    BP Readings from Last 3 Encounters:  10/14/16 100/72  09/16/16 102/69  06/30/16 118/66   Wt Readings from Last 3 Encounters:  10/14/16 152 lb 8 oz (69.2 kg)  09/16/16 149 lb 0.5 oz (67.6 kg)  06/30/16 149 lb 6.4 oz (67.8 kg)    Physical Exam  Constitutional: She is well-developed, well-nourished, and in no distress.  Cardiovascular: Normal rate, regular rhythm and normal heart sounds.   Pulmonary/Chest: Effort normal and breath sounds normal.  Musculoskeletal: She exhibits no edema.  Neurological: She is alert. Gait normal.  Skin: Skin is warm and dry.     Assessment/Plan: Please see individual problem  list.  Neuropathy due to chemotherapeutic drug ([place]) Stable. She'll continue Cymbalta and she will discuss tapering off of this with her neurologist.  Osteoporosis Patient still unsure of what she wants to do with regards to this. I did discuss treatment regimens with her again. I will check with a pharmacist to ensure that Fosamax is safe for her to use given her history of breast cancer. She was advised to call us later this week if she has not heard from [place] regarding this.  Bilateral lower extremity edema Patient with recurrent issues with lower extremity edema since being on chemotherapy. Unsure if it is related to this. She was on a chemotherapeutic agent that could cause cardiac dysfunction though she has no cardiac symptoms other than swelling. We'll check some lab work. She will stop her Lasix and see if the edema recurs. If it does would plan on obtaining an echo to evaluate further. If does not recur will continue to monitor.   Orders Placed This Encounter  Procedures  . Comp Met (CMET)  . TSH    [name], MD [place]

## 2016-10-14 NOTE — Patient Instructions (Signed)
Nice to see you. I'm going to contact a pharmacist regarding Fosamax with your history of cancer. We will have you try to hold off on taking the Lasix and see if the edema recurs. Please keep your follow-up with your neurologist.

## 2016-10-14 NOTE — Assessment & Plan Note (Signed)
Patient still unsure of what she wants to do with regards to this. I did discuss treatment regimens with her again. I will check with a pharmacist to ensure that Fosamax is safe for her to use given her history of breast cancer. She was advised to call us later this week if she has not heard from [place] regarding this.

## 2016-10-21 ENCOUNTER — Ambulatory Visit: Payer: 59 | Admitting: General Surgery

## 2016-10-22 NOTE — Telephone Encounter (Signed)
Please advise 

## 2016-10-22 NOTE — Telephone Encounter (Signed)
Pt called back to follow up on the fosamax. Pt stated that Dr Caryl Bis was to follow up with the pharmacy being that she had a history of cancer. Please advise?  Call pt @ [phone]. Or cell [phone]. Thank you!

## 2016-10-27 ENCOUNTER — Ambulatory Visit (INDEPENDENT_AMBULATORY_CARE_PROVIDER_SITE_OTHER): Payer: 59 | Admitting: General Surgery

## 2016-10-27 ENCOUNTER — Encounter: Payer: Self-pay | Admitting: General Surgery

## 2016-10-27 VITALS — BP 98/52 | HR 68 | Resp 12 | Ht 64.0 in | Wt 151.0 lb

## 2016-10-27 DIAGNOSIS — C50511 Malignant neoplasm of lower-outer quadrant of right female breast: Secondary | ICD-10-CM | POA: Diagnosis not present

## 2016-10-27 NOTE — Telephone Encounter (Signed)
I have sent a message to a pharmacist and am awaiting a response. As soon as I hear back from the pharmacist we will let her know.

## 2016-10-27 NOTE — Progress Notes (Signed)
Patient ID: Wanda Lopez, female   DOB: Dec 26, 1960, 55 y.o.   MRN: [id number]  Chief Complaint  Patient presents with  . Other    Port removal    HPI Wanda Lopez is a 55 y.o. female here today for her port removal procedure.  HPI  Past Medical History:  Diagnosis Date  . Breast cancer ([place]) 2016   right breast  . 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 Wide excision, sentinel node biopsy.  . Dehydration 05/28/2015  . Fainting spell   . Malignant neoplasm of lower-outer quadrant of right female breast ([place]) 12/17/2015  . Migraines     Past Surgical History:  Procedure Laterality Date  . BREAST EXCISIONAL BIOPSY Right 02-21-15   wide excision/sentinel node biopsy.  . COLONOSCOPY  2013   Dr Vira Agar  . PORTACATH PLACEMENT  03-29-15   Dr Bary Castilla    Family History  Problem Relation Age of Onset  . Hypertension Mother   . Heart failure Mother   . Osteoarthritis Mother   . Stroke Mother   . Hypertension Father   . Diabetes Father   . Osteoporosis Sister   . Epilepsy Sister     Social History Social History  Substance Use Topics  . Smoking status: Never Smoker  . Smokeless tobacco: Never Used  . Alcohol use No    No Known Allergies  Current Outpatient Prescriptions  Medication Sig Dispense Refill  . b complex vitamins tablet Take 1 tablet by mouth daily.    . Biotin 1 MG CAPS Take by mouth daily.    . Calcium-Vitamin D 600-200 MG-UNIT tablet Take 1 tablet by mouth daily.    Marland Kitchen CINNAMON PO Take 1,000 mg by mouth.    . DULoxetine (CYMBALTA) 20 MG capsule Take 20 mg by mouth daily.    . Flax Oil-Fish Oil-Borage Oil ([employer] OMEGA-3 PO) Take by mouth.    . furosemide (LASIX) 20 MG tablet Take 1 tablet (20 mg total) by mouth daily. Take 1 or 2 times per day as needed for swelling. 60 tablet 0  . Melatonin 3 MG TABS Take by mouth as needed.    . potassium chloride SA (KLOR-CON M20) 20 MEQ  tablet Take 1 tablet (20 mEq total) by mouth 2 (two) times daily. 60 tablet 1  . vitamin E 1000 UNIT capsule Take 1,000 Units by mouth daily.     No current facility-administered medications for this visit.    Facility-Administered Medications Ordered in Other Visits  Medication Dose Route Frequency Provider Last Rate Last Dose  . sodium chloride 0.9 % injection 10 mL  10 mL Intracatheter PRN Lloyd Huger, MD   10 mL at 06/13/15 1403  . sodium chloride 0.9 % injection 10 mL  10 mL Intravenous PRN Lloyd Huger, MD   10 mL at 10/30/15 1442     Review of Systems Review of Systems  Constitutional: Negative.   Respiratory: Negative.   Cardiovascular: Negative.     Blood pressure (!) 98/52, pulse 68, resp. rate 12, height 5\' 4"  (1.626 m), weight 151 lb (68.5 kg).  Physical Exam Physical Exam  Constitutional: She is oriented to person, place, and time.  Pulmonary/Chest:    Neurological: She is alert and oriented to person, place, and time.  Skin: Skin is warm and dry.    Data Reviewed Request for port removal received for medical oncology.  Assessment  Candidate for PowerPort removal.    Plan    The procedure was reviewed and the patient was amenable to proceed. 10 mL of 0.5% Xylocaine with 0.25% Marcaine with 1-200,000 units of epinephrine was utilized well tolerated. This was supplemented with 3 mL of 1% plain Xylocaine. The area was prepped with ChloraPrep and draped. The original incision was opened. Previously placed transfixion sutures of Prolene were divided and removed. The port was removed without difficulty with an intact tip. The deep tissue was approximated with a running 3-0 Vicryl suture. The skin was closed with a running 3-0 Vicryl subcuticular suture. Enslen, Steri-Strips, Telfa and Tegaderm dressings were applied.  The procedure was well tolerated. Post excision instructions were provided. She'll make use of ice intermittently tonight for comfort as  well as Tylenol if needed for pain. She is welcome to return in one week for post procedure exam with the staff if desired.       Robert Bellow 10/27/2016, 9:26 PM

## 2016-10-27 NOTE — Patient Instructions (Signed)
Tylenol as needed.

## 2016-11-03 ENCOUNTER — Telehealth: Payer: Self-pay | Admitting: Family Medicine

## 2016-11-03 DIAGNOSIS — R600 Localized edema: Secondary | ICD-10-CM

## 2016-11-03 NOTE — Telephone Encounter (Signed)
Pt called and was looking for an update on the Fosamax. Also wanted to let Dr. Caryl Bis know that she started to take her furosemide (LASIX) 20 MG tablet and Klor-con again, started about 1 1/2 weeks ago. Please advise, thank you!  Call pt @ [phone]

## 2016-11-03 NOTE — Telephone Encounter (Signed)
FYI

## 2016-11-03 NOTE — Telephone Encounter (Signed)
LM for patient to return call.

## 2016-11-03 NOTE — Telephone Encounter (Signed)
I received a message back from the pharmacist noting that it should be okay for her to take Fosamax. If she is willing to start on this I can send this in for her. Noted regarding the Lasix. Please find out if she had recurrent swelling. If she did we need to obtain an echo. Thanks.

## 2016-11-07 NOTE — Telephone Encounter (Signed)
LM at patients work to return call

## 2016-11-07 NOTE — Telephone Encounter (Signed)
Please call the patient at [phone], she has questions about the fosamax.

## 2016-11-11 NOTE — Telephone Encounter (Signed)
Order placed

## 2016-11-11 NOTE — Telephone Encounter (Signed)
Spoke with patient and she wants to hold off on the Fosamax for right now. She will call back if she decides she wants to go on this. She stated that she has been taking the lasix since she has had the swelling in the legs. She has not went of of this. She had an appointment with neurology and per her she said that he thinks it has to do with the neuropathy. She said that she has seen a little difference on the lasix.

## 2016-11-11 NOTE — Telephone Encounter (Signed)
Patient is fine with going ahead with the Echo. Please order

## 2016-11-11 NOTE — Telephone Encounter (Signed)
Noted. Given that she still has some swelling I would like to proceed with an echo to evaluate her heart if she is willing. I can place an order for this if she would like. Thanks.

## 2016-11-13 ENCOUNTER — Other Ambulatory Visit: Payer: Self-pay

## 2016-11-13 ENCOUNTER — Ambulatory Visit (INDEPENDENT_AMBULATORY_CARE_PROVIDER_SITE_OTHER): Payer: 59

## 2016-11-13 DIAGNOSIS — R600 Localized edema: Secondary | ICD-10-CM | POA: Diagnosis not present

## 2016-11-22 DIAGNOSIS — G620 Drug-induced polyneuropathy: Secondary | ICD-10-CM | POA: Insufficient documentation

## 2016-11-22 DIAGNOSIS — T451X5A Adverse effect of antineoplastic and immunosuppressive drugs, initial encounter: Secondary | ICD-10-CM

## 2016-11-25 ENCOUNTER — Other Ambulatory Visit: Payer: Self-pay | Admitting: *Deleted

## 2016-11-25 NOTE — Telephone Encounter (Signed)
Deferred to PCP who ordered it

## 2016-12-05 ENCOUNTER — Encounter: Payer: Self-pay | Admitting: Radiation Oncology

## 2016-12-05 ENCOUNTER — Ambulatory Visit
Admission: RE | Admit: 2016-12-05 | Discharge: 2016-12-05 | Disposition: A | Discharge status: Home / Self Care | Payer: 59 | Source: Ambulatory Visit | Attending: Radiation Oncology | Admitting: Radiation Oncology

## 2016-12-05 VITALS — BP 115/76 | HR 85 | Temp 96.5°F | Wt 147.9 lb

## 2016-12-05 DIAGNOSIS — C50511 Malignant neoplasm of lower-outer quadrant of right female breast: Secondary | ICD-10-CM | POA: Insufficient documentation

## 2016-12-05 DIAGNOSIS — Z170 Estrogen receptor positive status [ER+]: Secondary | ICD-10-CM | POA: Diagnosis not present

## 2016-12-05 DIAGNOSIS — Z923 Personal history of irradiation: Secondary | ICD-10-CM | POA: Insufficient documentation

## 2016-12-16 ENCOUNTER — Other Ambulatory Visit: Payer: Self-pay | Admitting: Obstetrics & Gynecology

## 2016-12-16 DIAGNOSIS — Z1239 Encounter for other screening for malignant neoplasm of breast: Secondary | ICD-10-CM

## 2016-12-16 DIAGNOSIS — N6310 Unspecified lump in the right breast, unspecified quadrant: Secondary | ICD-10-CM | POA: Diagnosis not present

## 2016-12-16 DIAGNOSIS — Z1231 Encounter for screening mammogram for malignant neoplasm of breast: Secondary | ICD-10-CM | POA: Diagnosis not present

## 2016-12-16 DIAGNOSIS — Z853 Personal history of malignant neoplasm of breast: Secondary | ICD-10-CM | POA: Diagnosis not present

## 2016-12-19 ENCOUNTER — Ambulatory Visit
Admission: RE | Admit: 2016-12-19 | Discharge: 2016-12-19 | Disposition: A | Discharge status: Home / Self Care | Payer: 59 | Source: Ambulatory Visit | Attending: Obstetrics & Gynecology | Admitting: Obstetrics & Gynecology

## 2016-12-19 DIAGNOSIS — Z853 Personal history of malignant neoplasm of breast: Secondary | ICD-10-CM

## 2016-12-19 DIAGNOSIS — R922 Inconclusive mammogram: Secondary | ICD-10-CM | POA: Diagnosis not present

## 2016-12-19 DIAGNOSIS — N6489 Other specified disorders of breast: Secondary | ICD-10-CM | POA: Diagnosis not present

## 2016-12-19 DIAGNOSIS — Z9221 Personal history of antineoplastic chemotherapy: Secondary | ICD-10-CM | POA: Insufficient documentation

## 2016-12-19 DIAGNOSIS — N6310 Unspecified lump in the right breast, unspecified quadrant: Secondary | ICD-10-CM

## 2016-12-19 DIAGNOSIS — Z1239 Encounter for other screening for malignant neoplasm of breast: Secondary | ICD-10-CM

## 2016-12-19 DIAGNOSIS — N63 Unspecified lump in unspecified breast: Secondary | ICD-10-CM | POA: Diagnosis not present

## 2016-12-19 HISTORY — DX: Personal history of antineoplastic chemotherapy: Z92.21

## 2016-12-19 HISTORY — DX: Personal history of irradiation: Z92.3

## 2016-12-31 ENCOUNTER — Other Ambulatory Visit: Payer: 59

## 2016-12-31 ENCOUNTER — Ambulatory Visit: Payer: 59

## 2017-01-15 ENCOUNTER — Encounter: Payer: Self-pay | Admitting: Family Medicine

## 2017-01-15 ENCOUNTER — Ambulatory Visit (INDEPENDENT_AMBULATORY_CARE_PROVIDER_SITE_OTHER): Payer: 59 | Admitting: Family Medicine

## 2017-01-15 VITALS — BP 100/70 | HR 74 | Temp 98.7°F | Wt 150.4 lb

## 2017-01-15 DIAGNOSIS — Z23 Encounter for immunization: Secondary | ICD-10-CM

## 2017-01-15 DIAGNOSIS — Z1159 Encounter for screening for other viral diseases: Secondary | ICD-10-CM

## 2017-01-15 DIAGNOSIS — R600 Localized edema: Secondary | ICD-10-CM

## 2017-01-15 DIAGNOSIS — Z114 Encounter for screening for human immunodeficiency virus [HIV]: Secondary | ICD-10-CM | POA: Diagnosis not present

## 2017-01-15 DIAGNOSIS — T451X5A Adverse effect of antineoplastic and immunosuppressive drugs, initial encounter: Secondary | ICD-10-CM

## 2017-01-15 DIAGNOSIS — G620 Drug-induced polyneuropathy: Secondary | ICD-10-CM

## 2017-01-15 LAB — HIV ANTIBODY (ROUTINE TESTING W REFLEX): HIV 1&2 Ab, 4th Generation: NONREACTIVE

## 2017-01-15 NOTE — Assessment & Plan Note (Signed)
Stable. Suspect venous insufficiency. Prior workup unremarkable. Discussed continuing Lasix as needed. She'll keep her legs elevated as much as possible.

## 2017-01-15 NOTE — Progress Notes (Signed)
Pre visit review using our clinic review tool, if applicable. No additional management support is needed unless otherwise documented below in the visit note. 

## 2017-01-15 NOTE — Patient Instructions (Addendum)
Nice to see you. Please keep your legs elevated to try to help with the swelling. Please monitor your neuropathy and if this worsens please let us know.

## 2017-01-15 NOTE — Progress Notes (Signed)
  [name], MD Phone: [phone]  Wanda Lopez is a 56 y.o. female who presents today for follow-up.  Neuropathy: Notes this is about the same. She's come off Cymbalta and has not noticed any difference. States her toes feel fat and her ankles feel stiff at times. She follows with neurology for this.  Continues to have slight edema in her legs near her ankles and feet. Takes Lasix most days. Does not prop her legs up. No shortness of breath, orthopnea, or PND. Prior echo unremarkable. Prior lab work unremarkable as well.  ROS see history of present illness  Objective  Physical Exam Vitals:   01/15/17 0848  BP: 100/70  Pulse: 74  Temp: 98.7 F (37.1 C)    BP Readings from Last 3 Encounters:  01/15/17 100/70  12/05/16 115/76  10/27/16 (!) 98/52   Wt Readings from Last 3 Encounters:  01/15/17 150 lb 6.4 oz (68.2 kg)  12/05/16 147 lb 14.9 oz (67.1 kg)  10/27/16 151 lb (68.5 kg)    Physical Exam  Constitutional: No distress.  Cardiovascular: Normal rate, regular rhythm and normal heart sounds.   Pulmonary/Chest: Effort normal and breath sounds normal.  Musculoskeletal: She exhibits no edema.  Neurological: She is alert. Gait normal.  Sensation to light touch intact bilateral feet  Skin: She is not diaphoretic.     Assessment/Plan: Please see individual problem list.  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 She'll continue to monitor. If worsens could consider Lyrica.  Bilateral lower extremity edema Stable. Suspect venous insufficiency. Prior workup unremarkable. Discussed continuing Lasix as needed. She'll keep her legs elevated as much as possible.   Orders Placed This Encounter  Procedures  . Tdap vaccine greater than or equal to 7yo IM  . Hepatitis C Antibody  . HIV antibody (with reflex)    No orders of the defined types were placed in this encounter.   # Healthcare maintenance: Tdap given. Check for hepatitis C and HIV as  screening tests. Patient has received blood transfusion previously and is in recommended screening age group for hepatitis C.   [name], MD [place]

## 2017-01-15 NOTE — Assessment & Plan Note (Signed)
Stable. She'll continue to monitor. If worsens could consider Lyrica.

## 2017-01-16 LAB — HEPATITIS C ANTIBODY: HCV Ab: NEGATIVE

## 2017-01-25 ENCOUNTER — Other Ambulatory Visit: Payer: Self-pay | Admitting: Family Medicine

## 2017-01-26 NOTE — Telephone Encounter (Signed)
Last OV 01/15/17 last filled 10/14/16 60 0rf

## 2017-01-29 ENCOUNTER — Ambulatory Visit: Payer: 59

## 2017-02-05 ENCOUNTER — Ambulatory Visit (INDEPENDENT_AMBULATORY_CARE_PROVIDER_SITE_OTHER): Payer: 59 | Admitting: General Surgery

## 2017-02-05 ENCOUNTER — Encounter: Payer: Self-pay | Admitting: General Surgery

## 2017-02-05 VITALS — BP 114/82 | HR 87 | Resp 12 | Ht 64.0 in | Wt 151.0 lb

## 2017-02-05 DIAGNOSIS — C50511 Malignant neoplasm of lower-outer quadrant of right female breast: Secondary | ICD-10-CM

## 2017-02-05 NOTE — Patient Instructions (Signed)
The patient has been asked to return to the office in one year with a bilateral diagnostic mammogram. 

## 2017-02-05 NOTE — Progress Notes (Signed)
Patient ID: Wanda Lopez, female   DOB: 02-02-1961, 56 y.o.   MRN: [id number]  Chief Complaint  Patient presents with  . Follow-up    HPI Wanda Lopez is a 56 y.o. female who presents for a breast evaluation. The most recent mammogram and right breast ultrasound was done on 01/29/2017. Patient states her right breast feels heavy.  The patient reported the above findings to her gynecologist who arrange for mammography and ultrasound area she is seen today for her routine follow-up. Patient does perform regular self breast checks and gets regular mammograms done.    HPI  Past Medical History:  Diagnosis Date  . Breast cancer ([place]) 2016   right breast  . 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 Wide excision, sentinel node biopsy.  . Dehydration 05/28/2015  . Fainting spell   . Malignant neoplasm of lower-outer quadrant of right female breast ([place]) 12/17/2015  . Migraines   . Personal history of chemotherapy   . Personal history of radiation therapy     Past Surgical History:  Procedure Laterality Date  . BREAST EXCISIONAL BIOPSY Right 02-21-15   wide excision/sentinel node biopsy.  . COLONOSCOPY  2013   Dr Vira Agar  . PORTACATH PLACEMENT  03-29-15   Dr Bary Castilla    Family History  Problem Relation Age of Onset  . Hypertension Mother   . Heart failure Mother   . Osteoarthritis Mother   . Stroke Mother   . Hypertension Father   . Diabetes Father   . Osteoporosis Sister   . Epilepsy Sister   . Breast cancer Neg Hx     Social History Social History  Substance Use Topics  . Smoking status: Never Smoker  . Smokeless tobacco: Never Used  . Alcohol use No    No Known Allergies  Current Outpatient Prescriptions  Medication Sig Dispense Refill  . b complex vitamins tablet Take 1 tablet by mouth daily.    . Biotin 1 MG CAPS Take by mouth daily.    . Calcium-Vitamin D 600-200 MG-UNIT tablet  Take 1 tablet by mouth daily.    Marland Kitchen CINNAMON PO Take 1,000 mg by mouth.    . DULoxetine (CYMBALTA) 20 MG capsule Take 20 mg by mouth daily.    . Flax Oil-Fish Oil-Borage Oil (CVS OMEGA-3 PO) Take by mouth.    . furosemide (LASIX) 20 MG tablet TAKE 1 TABLET (20 MG TOTAL) BY MOUTH DAILY. TAKE 1 OR 2 TIMES PER DAY AS NEEDED FOR SWELLING. 60 tablet 0  . Melatonin 3 MG TABS Take by mouth as needed.    . potassium chloride SA (KLOR-CON M20) 20 MEQ tablet Take 1 tablet (20 mEq total) by mouth 2 (two) times daily. 60 tablet 1   No current facility-administered medications for this visit.    Facility-Administered Medications Ordered in Other Visits  Medication Dose Route Frequency Provider Last Rate Last Dose  . sodium chloride 0.9 % injection 10 mL  10 mL Intracatheter PRN Lloyd Huger, MD   10 mL at 06/13/15 1403  . sodium chloride 0.9 % injection 10 mL  10 mL Intravenous PRN Lloyd Huger, MD   10 mL at 10/30/15 1442    Review of Systems Review of Systems  Constitutional: Negative.   Respiratory: Negative.   Cardiovascular: Negative.     Blood pressure 114/82, pulse 87, resp. rate 12, height 5\' 4"  (1.626 m), weight 151 lb (68.5  kg).  Physical Exam Physical Exam  Constitutional: She is oriented to person, place, and time. She appears well-developed and well-nourished.  Eyes: Conjunctivae are normal. No scleral icterus.  Neck: Neck supple.  Cardiovascular: Normal rate, regular rhythm and normal heart sounds.   Pulmonary/Chest: Effort normal and breath sounds normal. Right breast exhibits no inverted nipple, no mass, no nipple discharge, no skin change and no tenderness. Left breast exhibits no inverted nipple, no mass, no nipple discharge, no skin change and no tenderness.    Well healed incision at 9 o'clock right breast.   Good volume preservation.  Abdominal: Soft. Bowel sounds are normal. There is no tenderness.  Lymphadenopathy:    She has no cervical adenopathy.    She  has no axillary adenopathy.  Neurological: She is alert and oriented to person, place, and time.  Skin: Skin is warm and dry.    Data Reviewed Bilateral mammograms dated 12/19/2016 were reviewed. BI-RADS-2. Postsurgical changes.    Assessment    Stable breast exam now 2 years status post breast conservation.    Plan    The patient was encouraged to call the office directly if she felt any areas of concern or discomfort in her breast.    The patient has been asked to return to the office in one year with a bilateral diagnostic mammogram. This information has been scribed by Gaspar Cola CMA.   Robert Bellow 02/05/2017, 7:48 PM

## 2017-02-06 DIAGNOSIS — T451X5A Adverse effect of antineoplastic and immunosuppressive drugs, initial encounter: Secondary | ICD-10-CM | POA: Diagnosis not present

## 2017-02-06 DIAGNOSIS — G620 Drug-induced polyneuropathy: Secondary | ICD-10-CM | POA: Diagnosis not present

## 2017-03-02 ENCOUNTER — Other Ambulatory Visit: Payer: Self-pay

## 2017-03-02 NOTE — Telephone Encounter (Signed)
Patient is requesting 90 days, last filled 10/14/16 60 1rf

## 2017-03-02 NOTE — Telephone Encounter (Signed)
Please check to see if the patient is taking this daily. Please see if she is taking with her Lasix. Thanks.

## 2017-03-04 ENCOUNTER — Other Ambulatory Visit: Payer: Self-pay | Admitting: Family Medicine

## 2017-03-04 MED ORDER — FUROSEMIDE 20 MG PO TABS: 20.0000 mg | ORAL_TABLET | Freq: Every day | ORAL | 2 refills | Status: DC

## 2017-03-04 NOTE — Telephone Encounter (Signed)
Left message to return call 

## 2017-03-04 NOTE — Telephone Encounter (Signed)
Last OV 01/15/17 last filled 01/26/17 60 0rf

## 2017-03-04 NOTE — Telephone Encounter (Signed)
Lasix sent to pharmacy

## 2017-03-04 NOTE — Telephone Encounter (Signed)
Patient states she takes lasix and klor-con once a day every day. She states she just filled this on Monday.

## 2017-03-16 NOTE — Progress Notes (Signed)
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  INTERVAL HISTORY:   Patient returns to clinic toay for routine 6 month follow-up  She continues to have a mild peripheral neuropathy that is being managed by neurology.She has discontinued gabapentin. She otherwise feels well and is asymptomatic. She does not complain of weakness or fatigue today. She does not complain of dizziness today. She has no other neurologic complaints. She denies any recent fevers. She denies any chest pain or shortness of breath. She denies any vomiting, constipation, or diarrhea. She has no urinary complaints. Patient offers no further specific complaints today.  REVIEW OF SYSTEMS:   Review of Systems  Constitutional: Negative.  Negative for fever, malaise/fatigue and weight loss.  Respiratory: Negative for cough and shortness of breath.   Cardiovascular: Negative.  Negative for chest pain and leg swelling.  Gastrointestinal: Negative.  Negative for abdominal pain.  Genitourinary: Negative.   Musculoskeletal: Negative.   Neurological: Positive for sensory change. Negative for weakness.  Psychiatric/Behavioral: Negative.  The patient is not nervous/anxious.    As per HPI. Otherwise, a complete review of systems is negative.   PAST MEDICAL HISTORY: Past Medical History:  Diagnosis Date  . Breast cancer ([place]) 2016   right breast  . Breast cancer of lower-outer quadrant of right female breast ([place]) 02/09/2015   1.7  triple negative, medullary carcinoma. Wide excision, sentinel node biopsy.  . Dehydration 05/28/2015  . Fainting spell   . Malignant neoplasm of lower-outer quadrant of right female breast ([place]) 12/17/2015  . Migraines   . Personal history of chemotherapy   . Personal history of radiation therapy     PAST SURGICAL HISTORY: Past Surgical History:  Procedure Laterality Date  . BREAST EXCISIONAL BIOPSY Right 02-21-15   wide excision/sentinel node biopsy.  . COLONOSCOPY  2013   Dr Vira Agar  . PORTACATH PLACEMENT  03-29-15   Dr Bary Castilla    FAMILY HISTORY Family History  Problem Relation Age of Onset  . Hypertension Mother   . Heart failure Mother   . Osteoarthritis Mother   . Stroke Mother   . Hypertension Father   . Diabetes Father   . Osteoporosis Sister   . Epilepsy Sister   . Breast cancer Neg Hx     GYNECOLOGIC HISTORY:  No LMP recorded. Patient is not currently having periods (Reason: Chemotherapy).     ADVANCED DIRECTIVES:    HEALTH MAINTENANCE: Social History  Substance Use Topics  . Smoking status: Never Smoker  . Smokeless tobacco: Never Used  . Alcohol use No     Colonoscopy:  PAP:  Bone density:  Lipid panel:  No Known Allergies  Current Outpatient Prescriptions  Medication Sig Dispense Refill  . b complex vitamins tablet Take 1 tablet by mouth daily.    . Biotin 1 MG CAPS Take by mouth daily.    . Calcium-Vitamin D 600-200 MG-UNIT tablet Take 1 tablet by mouth daily.    Marland Kitchen CINNAMON PO Take 1,000 mg by mouth.    . Flax Oil-Fish Oil-Borage Oil (CVS OMEGA-3  PO) Take by mouth.    . furosemide (LASIX) 20 MG tablet Take 1 tablet (20 mg total) by mouth daily. Take 1 or 2 times per day as needed for swelling. 60 tablet 2  . Melatonin 3 MG TABS Take by mouth as needed.    . potassium chloride SA (KLOR-CON M20) 20 MEQ tablet Take 1 tablet (20 mEq total) by mouth 2 (two) times daily. 60 tablet 1  . DULoxetine (CYMBALTA) 20 MG capsule Take 20 mg by mouth daily.      No current facility-administered medications for this visit.    Facility-Administered Medications Ordered in Other Visits  Medication Dose Route Frequency Provider Last Rate Last Dose  . sodium chloride 0.9 % injection 10 mL  10 mL Intracatheter PRN Lloyd Huger, MD   10 mL at 06/13/15 1403  . sodium chloride 0.9 % injection 10 mL  10 mL Intravenous PRN Lloyd Huger, MD   10 mL at 10/30/15 1442    OBJECTIVE: BP 103/67 (BP Location: Left Arm, Patient Position: Sitting)   Pulse 73   Wt 150 lb 11.2 oz (68.4 kg)   BMI 25.87 kg/m    Body mass index is 25.87 kg/m.    ECOG FS:0 - Asymptomatic  General: Well-developed, well-nourished, no acute distress. Eyes: Pink conjunctiva, anicteric sclera. Breasts: Patient refused exam today. Lungs: Clear to auscultation bilaterally. Heart: Regular rate and rhythm. No rubs, murmurs, or gallops. Abdomen: Soft, nontender, nondistended. No organomegaly noted, normoactive bowel sounds. Musculoskeletal: No edema, cyanosis, or clubbing. Neuro: Alert, answering all questions appropriately. Cranial nerves grossly intact. Skin: No rashes or petechiae note. Psych: Normal affect.  LAB RESULTS:  Appointment on 03/17/2017  Component Date Value Ref Range Status  . CA 27.29 03/17/2017 24.7  0.0 - 38.6 U/mL Final   Comment: (NOTE) Bayer Centaur/ACS methodology Performed At: [facility] [place], [place] [id number] Lindon Romp MD FI:[id number]    Lab Results  Component Value Date   LABCA2 24.7 03/17/2017    STUDIES: No results found.  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  PLAN:   1. 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 Patient is status post wide excision, MammoSite therapy, chemotherapy with Adriamycin and Cytoxan, as well as 12 weeks of Taxol. Patient completed her chemotherapy on  September 17, 2015.  Patient's most recent mammogram on December 19, 2016 was reported as BI-RADS 2, repeat in January 2019. Patient's CA-27-29 also continues to be within normal limits. Return to clinic in 6 months for further evaluation. 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 Patient has discontinued Cymbalta and gabapentin. Continue follow-up and management per neurology.   Patient expressed understanding and was in agreement with this plan. She also understands that She can call clinic at any time with any questions, concerns, or complaints.    Breast cancer [facility])   Staging form: Breast, AJCC 7th Edition     Clinical stage from 03/26/2015: Stage IA (T1c, N0, M0) - Signed by Lloyd Huger, MD on 03/26/2015   Lloyd Huger, MD   03/22/2017 8:42 PM

## 2017-03-17 ENCOUNTER — Encounter: Payer: Self-pay | Admitting: Oncology

## 2017-03-17 ENCOUNTER — Inpatient Hospital Stay: Payer: 59 | Attending: Oncology

## 2017-03-17 ENCOUNTER — Inpatient Hospital Stay (HOSPITAL_BASED_OUTPATIENT_CLINIC_OR_DEPARTMENT_OTHER): Payer: 59 | Admitting: Oncology

## 2017-03-17 VITALS — BP 103/67 | HR 73 | Wt 150.7 lb

## 2017-03-17 DIAGNOSIS — Z171 Estrogen receptor negative status [ER-]: Secondary | ICD-10-CM | POA: Insufficient documentation

## 2017-03-17 DIAGNOSIS — Z79899 Other long term (current) drug therapy: Secondary | ICD-10-CM | POA: Insufficient documentation

## 2017-03-17 DIAGNOSIS — Z9221 Personal history of antineoplastic chemotherapy: Secondary | ICD-10-CM

## 2017-03-17 DIAGNOSIS — G629 Polyneuropathy, unspecified: Secondary | ICD-10-CM

## 2017-03-17 DIAGNOSIS — Z923 Personal history of irradiation: Secondary | ICD-10-CM | POA: Diagnosis not present

## 2017-03-17 DIAGNOSIS — C50511 Malignant neoplasm of lower-outer quadrant of right female breast: Secondary | ICD-10-CM

## 2017-03-18 LAB — CANCER ANTIGEN 27.29: CA 27.29: 24.7 U/mL (ref 0.0–38.6)

## 2017-03-28 ENCOUNTER — Other Ambulatory Visit: Payer: Self-pay | Admitting: Family Medicine

## 2017-05-20 DIAGNOSIS — Z1211 Encounter for screening for malignant neoplasm of colon: Secondary | ICD-10-CM | POA: Diagnosis not present

## 2017-05-20 DIAGNOSIS — Z01419 Encounter for gynecological examination (general) (routine) without abnormal findings: Secondary | ICD-10-CM | POA: Diagnosis not present

## 2017-06-12 ENCOUNTER — Other Ambulatory Visit: Payer: Self-pay

## 2017-06-12 MED ORDER — POTASSIUM CHLORIDE CRYS ER 20 MEQ PO TBCR
20.0000 meq | EXTENDED_RELEASE_TABLET | Freq: Two times a day (BID) | ORAL | 1 refills | Status: DC
Start: 2017-06-12 — End: 2017-10-12

## 2017-07-15 ENCOUNTER — Ambulatory Visit: Payer: 59 | Admitting: Family Medicine

## 2017-07-27 ENCOUNTER — Other Ambulatory Visit: Payer: Self-pay | Admitting: Family Medicine

## 2017-07-27 MED ORDER — FUROSEMIDE 20 MG PO TABS: 20.0000 mg | ORAL_TABLET | Freq: Every day | ORAL | 2 refills | Status: DC | PRN

## 2017-07-27 NOTE — Telephone Encounter (Signed)
Patient needs a refill on her  furosemide (LASIX) 20 MG tablet

## 2017-07-27 NOTE — Telephone Encounter (Signed)
Last OV 01/15/17 last filled 03/04/17 60 2rf

## 2017-08-10 ENCOUNTER — Telehealth: Payer: Self-pay

## 2017-08-10 ENCOUNTER — Emergency Department: Payer: Commercial Managed Care - HMO

## 2017-08-10 ENCOUNTER — Emergency Department
Admission: EM | Admit: 2017-08-10 | Discharge: 2017-08-10 | Disposition: A | Discharge status: Home / Self Care | Payer: Commercial Managed Care - HMO | Attending: Emergency Medicine | Admitting: Emergency Medicine

## 2017-08-10 DIAGNOSIS — Z853 Personal history of malignant neoplasm of breast: Secondary | ICD-10-CM | POA: Diagnosis not present

## 2017-08-10 DIAGNOSIS — R042 Hemoptysis: Secondary | ICD-10-CM

## 2017-08-10 DIAGNOSIS — R9431 Abnormal electrocardiogram [ECG] [EKG]: Secondary | ICD-10-CM | POA: Diagnosis not present

## 2017-08-10 DIAGNOSIS — R918 Other nonspecific abnormal finding of lung field: Secondary | ICD-10-CM | POA: Diagnosis not present

## 2017-08-10 DIAGNOSIS — C50911 Malignant neoplasm of unspecified site of right female breast: Secondary | ICD-10-CM | POA: Diagnosis not present

## 2017-08-10 LAB — CBC
HEMATOCRIT: 41 % (ref 35.0–47.0)
Hemoglobin: 14.1 g/dL (ref 12.0–16.0)
MCH: 33.7 pg (ref 26.0–34.0)
MCHC: 34.5 g/dL (ref 32.0–36.0)
MCV: 97.9 fL (ref 80.0–100.0)
PLATELETS: 170 10*3/uL (ref 150–440)
RBC: 4.19 MIL/uL (ref 3.80–5.20)
RDW: 13.3 % (ref 11.5–14.5)
WBC: 6.2 10*3/uL (ref 3.6–11.0)

## 2017-08-10 LAB — BASIC METABOLIC PANEL
Anion gap: 9 (ref 5–15)
BUN: 12 mg/dL (ref 6–20)
CHLORIDE: 101 mmol/L (ref 101–111)
CO2: 28 mmol/L (ref 22–32)
CREATININE: 0.57 mg/dL (ref 0.44–1.00)
Calcium: 9.7 mg/dL (ref 8.9–10.3)
GFR calc Af Amer: >60 mL/min (ref >60)
GFR calc non Af Amer: >60 mL/min (ref >60)
GLUCOSE: 107 mg/dL — AB (ref 65–99)
Potassium: 3.4 mmol/L — ABNORMAL LOW (ref 3.5–5.1)
SODIUM: 138 mmol/L (ref 135–145)

## 2017-08-10 LAB — TROPONIN I: Troponin I: <0.03 ng/mL (ref <0.03)

## 2017-08-10 MED ORDER — LEVOFLOXACIN 500 MG PO TABS: 500.0000 mg | ORAL_TABLET | Freq: Every day | ORAL | 0 refills | Status: AC

## 2017-08-10 MED ORDER — IOPAMIDOL (ISOVUE-300) INJECTION 61%
75.0000 mL | Freq: Once | INTRAVENOUS | Status: AC | PRN
  Administered 2017-08-10: 75 mL via INTRAVENOUS

## 2017-08-10 NOTE — ED Notes (Signed)
Pt discharged to home.  Family member driving.  Discharge instructions reviewed.  Verbalized understanding.  No questions or concerns at this time.  Teach back verified.  Pt in NAD.  No items left in ED.   

## 2017-08-10 NOTE — ED Notes (Signed)
Patient transported to X-ray 

## 2017-08-10 NOTE — ED Triage Notes (Signed)
Pt is tearful in triage, states she was at work and was walking to the mail box and coughed, when she did she noticed bright red sputum, states she has coughed several times since with small clots and bright red blood. Denies any pain or SOB. States  She is scared because she has a Hx of breast CA.Marland Kitchen

## 2017-08-10 NOTE — ED Provider Notes (Addendum)
[facility] Emergency Department Provider Note       Time seen: ----------------------------------------- 2:14 PM on 08/10/2017 -----------------------------------------     I have reviewed the triage vital signs and the nursing notes.   HISTORY   Chief Complaint Hemoptysis    HPI Wanda Lopez is a 56 y.o. female who presents to the ED for hemoptysis. Patient states she was at work and was walking to [employer] when she coughed. Upon coughing she noticed bright blood in her sputum. Patient states it look like frank blood. She has never had hemoptysis before, nothing made it better or worse. Patient states she coughed several times since then with small clots but less blood. She denies any recent illness, pain or shortness of breath. Patient's concerned because she has a history of breast cancer and she is worried about cancer recurrence.   Past Medical History:  Diagnosis Date  . Breast cancer ([place]) 2016   right breast  . 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 Wide excision, sentinel node biopsy.  . Dehydration 05/28/2015  . Fainting spell   . Malignant neoplasm of lower-outer quadrant of right female breast ([place]) 12/17/2015  . Migraines   . Personal history of chemotherapy   . Personal history of radiation therapy     Patient Active Problem List   Diagnosis Date Noted  . 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 Bilateral lower extremity edema 10/14/2016  . 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 Osteoporosis 06/30/2016  . Primary cancer of lower outer quadrant of right female breast ([place]) 03/07/2015    Past Surgical History:  Procedure Laterality Date  . BREAST EXCISIONAL BIOPSY Right 02-21-15   wide excision/sentinel node biopsy.  Marland Kitchen BREAST LUMPECTOMY Right   . COLONOSCOPY  2013   Dr Vira Agar  . PORTACATH PLACEMENT  03-29-15    Dr Bary Castilla    Allergies Patient has no known allergies.  Social History Social History  Substance Use Topics  . Smoking status: Never Smoker  . Smokeless tobacco: Never Used  . Alcohol use No    Review of Systems Constitutional: Negative for fever. Eyes: Negative for vision changes ENT:  Negative for congestion, sore throat Cardiovascular: Negative for chest pain. Respiratory: Negative for shortness of breath.Positive for hemoptysis Gastrointestinal: Negative for abdominal pain, vomiting and diarrhea. Genitourinary: Negative for dysuria. Musculoskeletal: Negative for back pain. Skin: Negative for rash. Neurological: Negative for headaches, focal weakness or numbness.  All systems negative/normal/unremarkable except as stated in the HPI  ____________________________________________   PHYSICAL EXAM:  VITAL SIGNS: ED Triage Vitals [08/10/17 1234]  Enc Vitals Group     BP (!) 133/100     Pulse Rate (!) 110     Resp 20     Temp 97.9 F (36.6 C)     Temp Source Oral     SpO2 100 %     Weight 145 lb (65.8 kg)     Height 5\' 4"  (1.626 m)     Head Circumference      Peak Flow      Pain Score      Pain Loc      Pain Edu?      Excl. in [place]?    Constitutional: Alert and oriented. Well appearing and in no distress. Eyes: Conjunctivae are normal. Normal extraocular movements. ENT   Head: Normocephalic and atraumatic.   Nose: No congestion/rhinnorhea.   Mouth/Throat: Mucous  membranes are moist.   Neck: No stridor. Cardiovascular: Normal rate, regular rhythm. No murmurs, rubs, or gallops. Respiratory: Normal respiratory effort without tachypnea nor retractions. Breath sounds are clear and equal bilaterally. No wheezes/rales/rhonchi. Gastrointestinal: Soft and nontender. Normal bowel sounds Musculoskeletal: Nontender with normal range of motion in extremities. No lower extremity tenderness nor edema. Neurologic:  Normal speech and language. No gross focal  neurologic deficits are appreciated.  Skin:  Skin is warm, dry and intact. No rash noted. Psychiatric: Mood and affect are normal. Speech and behavior are normal.  ____________________________________________  EKG: Interpreted by me.Sinus rhythm with a rate of 90 bpm, normal PR interval, normal QRS, normal QT.  ____________________________________________  ED COURSE:  Pertinent labs & imaging results that were available during my care of the patient were reviewed by me and considered in my medical decision making (see chart for details). Patient presents for cough and hemoptysis, we will assess with labs and imaging as indicated.   Procedures ____________________________________________   LABS (pertinent positives/negatives)  Labs Reviewed  BASIC METABOLIC PANEL - Abnormal; Notable for the following:       Result Value   Potassium 3.4 (*)    Glucose, Bld 107 (*)    All other components within normal limits  CBC  TROPONIN I    RADIOLOGY Images were viewed by me  Chest x-ray IMPRESSION: 1. Trace pleural effusions versus pleural scarring. 2. No other acute cardiopulmonary abnormality; chronic appearing bilateral pulmonary interstitial opacity. CT of the chest with contrastIs pending IMPRESSION: 1. Ill-defined peribronchovascular ground-glass nodularity in the right middle lobe may be due to an infectious bronchiolitis and/or pulmonary hemorrhage. 2. No evidence of metastatic disease. ____________________________________________  FINAL ASSESSMENT AND PLAN  Hemoptysis   Plan: Patient's labs and imaging were dictated above. Patient had presented for Hemoptysis of uncertain etiology. CT of the chest is Reveals an ill-defined right middle lobe lesion concerning for infection or hemorrhage. I will discuss with pulmonology and likely recommend observation with possible bronchoscopy.   Earleen Newport, MD   Note: This note was generated in part or whole with voice  recognition software. Voice recognition is usually quite accurate but there are transcription errors that can and very often do occur. I apologize for any typographical errors that were not detected and corrected.     Earleen Newport, MD 08/10/17 1436    Earleen Newport, MD 08/10/17 [phone]

## 2017-08-10 NOTE — Telephone Encounter (Signed)
Patient calling for an appt .  Went to ed for coughing up blood has a hx of cancer and wants to be seen asap    She wants to be added to the waitlist .    She wants to know if there is anyway she can be added in to be seen sooner in the next day or so.

## 2017-08-11 DIAGNOSIS — G620 Drug-induced polyneuropathy: Secondary | ICD-10-CM | POA: Diagnosis not present

## 2017-08-11 DIAGNOSIS — T451X5A Adverse effect of antineoplastic and immunosuppressive drugs, initial encounter: Secondary | ICD-10-CM | POA: Diagnosis not present

## 2017-08-11 NOTE — Telephone Encounter (Signed)
Schedule as we discussed please. Thanks

## 2017-08-11 NOTE — Telephone Encounter (Signed)
Called patient scheduled 9/13 with Ram   Patient says she was rxd 10 day abx and wants to know if there is anything they are gouing to do at this visit.

## 2017-08-11 NOTE — Telephone Encounter (Signed)
Spoke with pt and she was asking if there would be any test done on the day of OV on 08/13/17. Informed pt probably not since she just had CT chest on 9/10 and was given 10 days of abx. Informed her that we gave her an appt on 08/13/17 per her request but per DS pt should wait for approx 2 weeks before coming in so that her abx have time to work. Pt scheduled again for 08/25/17 @ 930am and verbalized understanding. Nothing further needed.

## 2017-08-12 ENCOUNTER — Ambulatory Visit (INDEPENDENT_AMBULATORY_CARE_PROVIDER_SITE_OTHER): Payer: 59 | Admitting: Family Medicine

## 2017-08-12 ENCOUNTER — Encounter: Payer: Self-pay | Admitting: Family Medicine

## 2017-08-12 DIAGNOSIS — G620 Drug-induced polyneuropathy: Secondary | ICD-10-CM | POA: Diagnosis not present

## 2017-08-12 DIAGNOSIS — R042 Hemoptysis: Secondary | ICD-10-CM | POA: Insufficient documentation

## 2017-08-12 DIAGNOSIS — Z853 Personal history of malignant neoplasm of breast: Secondary | ICD-10-CM

## 2017-08-12 DIAGNOSIS — T451X5A Adverse effect of antineoplastic and immunosuppressive drugs, initial encounter: Secondary | ICD-10-CM | POA: Diagnosis not present

## 2017-08-12 NOTE — Progress Notes (Signed)
  [name], MD Phone: [phone]  Wanda Lopez is a 56 y.o. female who presents today for follow-up.  Patient recently in the emergency room for an episode of hemoptysis. Underwent CT scan and the real field possible pneumonia versus pulmonary hemorrhage. They discussed with pulmonology who recommended follow-up in their office. Patient was started on Levaquin. She had had no significant symptoms prior to the episode of hemoptysis. She had a dry cough earlier in the year though that had eased off more recently. Her sister-in-law had been congested over the weekend. No fevers or shortness of breath.  Neuropathy: No longer on medication for this. She was on alpha lipoic acid though they discontinued this yesterday. Notes she saw neurology yesterday. Notes her feet and toes feel about the same. Occasionally feels under her feet.  History of breast cancer: Followed with oncology and surgery. They do her breast exams. She is up-to-date on mammograms.  PMH: nonsmoker.   ROS see history of present illness  Objective  Physical Exam Vitals:   08/12/17 0839  BP: 102/66  Pulse: 75  Temp: 98.4 F (36.9 C)  SpO2: 98%    BP Readings from Last 3 Encounters:  08/12/17 102/66  08/10/17 107/72  03/17/17 103/67   Wt Readings from Last 3 Encounters:  08/12/17 153 lb 9.6 oz (69.7 kg)  08/10/17 145 lb (65.8 kg)  03/17/17 150 lb 11.2 oz (68.4 kg)    Physical Exam  Constitutional: No distress.  Cardiovascular: Normal rate, regular rhythm and normal heart sounds.   Pulmonary/Chest: Effort normal and breath sounds normal.  Musculoskeletal: She exhibits no edema.  Neurological: She is alert. Gait normal.  Skin: Skin is warm and dry. She is not diaphoretic.     Assessment/Plan: Please see individual problem list.  Neuropathy due to chemotherapeutic drug ([place]) Stable. Following neuropathy. No longer on medication. She'll monitor.  History of breast cancer Overall doing well.  Mammogram up-to-date. She'll continue to follow with oncology and general surgery for this.  Hemoptysis Patient with episode of hemoptysis earlier this week. Evaluated in the emergency room and found to have possible pneumonia versus pulmonary hemorrhage. Currently on Levaquin. Currently asymptomatic. She will complete her course of Levaquin and follow up with pulmonology to consider bronchoscopy. Given return precautions.   [name], MD [place]

## 2017-08-12 NOTE — Assessment & Plan Note (Signed)
Overall doing well. Mammogram up-to-date. She'll continue to follow with oncology and general surgery for this.

## 2017-08-12 NOTE — Assessment & Plan Note (Signed)
Stable. Following neuropathy. No longer on medication. She'll monitor.

## 2017-08-12 NOTE — Patient Instructions (Signed)
Nice to see you. Please keep the appointment with pulmonology. Please continue your Levaquin. Please monitor your neuropathy. Please keep follow-up with her oncologist. If you develop recurrent coughing up of blood, or you develop fevers, trouble breathing, or any new symptoms please seek medical attention.

## 2017-08-12 NOTE — Assessment & Plan Note (Signed)
Patient with episode of hemoptysis earlier this week. Evaluated in the emergency room and found to have possible pneumonia versus pulmonary hemorrhage. Currently on Levaquin. Currently asymptomatic. She will complete her course of Levaquin and follow up with pulmonology to consider bronchoscopy. Given return precautions.

## 2017-08-13 ENCOUNTER — Institutional Professional Consult (permissible substitution): Payer: 59 | Admitting: Internal Medicine

## 2017-08-20 NOTE — Progress Notes (Signed)
[facility] Consultation      Assessment and Plan:  Lung nodule/scarring in right lung, with recent episode of hemoptysis. -Discussed various possibilities including chronic scarring from previous radiation, versus atelectasis versus infection. Given the patient's history of breast cancer. She is very worried about the possibility of breast cancer. At this time, these nodules are fairly subtle and may be difficult to biopsy. I offered the patient the possibility of bronchoscopy with brushings and BAL versus repeat CT chest in 12 weeks' time. Given her concerns about cancer, she would prefer to go with the former and undergo bronchoscopy for airway inspection.   Date: 08/25/2017  MRN# [id number] Wanda Lopez 03/01/1961    Wanda Lopez is a 56 y.o. old female seen in consultation for chief complaint of:    Chief Complaint  Patient presents with  . Advice Only    Self referral from ED 08/10/17  . Hemoptysis    Pt was walking to mailbox and started coughing up blood (bright red)    HPI:   She was diagnosed with brca in 2016, she right lumpectomy and internal radiation to the right breast and chemo. 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 She has no respiratory problems other than a cold and lingering cough a year ago.  She take alpha lipoic acid for the past 6 months.  She notes that on 08/10/17 she was walking back from the mailbox when she coughed and noticed bright red blood. She did not have a cough at that time, she did not have any respiratory symptoms at that time. Pt is present today with her sister. She is upset because she thought she would have a repeat CT chest or CXR to r/o cancer. I explained that it is too early for CT chest to be useful, and a CXR would not be helpful, because her infiltrates are too subtle to be reliably picked up by CXR.   She takes occasional tylenol, no aspirin, aleve, motrin. No BC or goody powder.  The problem has never  happened before or since.    Images personally reviewed; CT 08/10/17, very mild scattered infiltrates seen in the right middle lobe. No appreciable lymphadenopathy.  PMHX:   Past Medical History:  Diagnosis Date  . Breast cancer ([facility]) 2016   right breast  . 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 Wide excision, sentinel node biopsy.  . Dehydration 05/28/2015  . Fainting spell   . Malignant neoplasm of lower-outer quadrant of right female breast ([facility]) 12/17/2015  . Migraines   . Personal history of chemotherapy   . Personal history of radiation therapy    Surgical Hx:  Past Surgical History:  Procedure Laterality Date  . BREAST EXCISIONAL BIOPSY Right 02-21-15   wide excision/sentinel node biopsy.  . BREAST LUMPECTOMY Right   . COLONOSCOPY  2013   Dr Elliott  . PORTACATH PLACEMENT  03-29-15   Dr Byrnett   Family Hx:  Family History  Problem Relation Age of Onset  . Hypertension Mother   . Heart failure Mother   . Osteoarthritis Mother   . Stroke Mother   . Hypertension Father   . Diabetes Father   . Osteoporosis Sister   . Epilepsy Sister   . Breast cancer Neg Hx    Social Hx:   Social History  Substance Use Topics  . Smoking status: Never Smoker  . Smokeless tobacco: Never Used  .   Alcohol use No   Medication:    Current Outpatient Prescriptions:  .  b complex vitamins tablet, Take 1 tablet by mouth daily., Disp: , Rfl:  .  Biotin 1 MG CAPS, Take 3,000 mg by mouth daily. , Disp: , Rfl:  .  Calcium-Vitamin D 600-200 MG-UNIT tablet, Take 1 tablet by mouth daily., Disp: , Rfl:  .  CINNAMON PO, Take 1,000 mg by mouth., Disp: , Rfl:  .  cyanocobalamin 1000 MCG tablet, Take 1,000 mcg by mouth daily., Disp: , Rfl:  .  Flax Oil-Fish Oil-Borage Oil ([employer] OMEGA-3 PO), Take by mouth., Disp: , Rfl:  .  furosemide (LASIX) 20 MG tablet, Take 1 tablet (20 mg total) by mouth daily as needed for  edema., Disp: 60 tablet, Rfl: 2 .  potassium chloride SA (KLOR-CON M20) 20 MEQ tablet, Take 1 tablet (20 mEq total) by mouth 2 (two) times daily., Disp: 180 tablet, Rfl: 1 .  Probiotic Product ([employer] ADV PROBIOTIC GUMMIES PO), Take 2 each by mouth daily., Disp: , Rfl:  .  vitamin E 1000 UNIT capsule, Take 1,000 Units by mouth daily., Disp: , Rfl:  No current facility-administered medications for this visit.   Facility-Administered Medications Ordered in Other Visits:  .  sodium chloride 0.9 % injection 10 mL, 10 mL, Intracatheter, PRN, Lloyd Huger, MD, 10 mL at 06/13/15 1403 .  sodium chloride 0.9 % injection 10 mL, 10 mL, Intravenous, PRN, Lloyd Huger, MD, 10 mL at 10/30/15 1442   Allergies:  Patient has no known allergies.  Review of Systems: Gen:  Denies  fever, sweats, chills HEENT: Denies blurred vision, double vision. bleeds, sore throat Cvc:  No dizziness, chest pain. Resp:   Denies cough or sputum production, shortness of breath Gi: Denies swallowing difficulty, stomach pain. Gu:  Denies bladder incontinence, burning urine Ext:   No Joint pain, stiffness. Skin: No skin rash,  hives  Endoc:  No polyuria, polydipsia. Psych: No depression, insomnia. Other:  All other systems were reviewed with the patient and were negative other that what is mentioned in the HPI.   Physical Examination:   VS: BP 114/70 (BP Location: Left Arm, Cuff Size: Normal)   Pulse 74   Ht 5' 4" (1.626 m)   SpO2 96%   General Appearance: No distress  Neuro:without focal findings,  speech normal,  HEENT: PERRLA, EOM intact.   Pulmonary: normal breath sounds, No wheezing.  CardiovascularNormal S1,S2.  No m/r/g.   Abdomen: Benign, Soft, non-tender. Renal:  No costovertebral tenderness  GU:  No performed at this time. Endoc: No evident thyromegaly, no signs of acromegaly. Skin:   warm, no rashes, no ecchymosis  Extremities: normal, no cyanosis, clubbing.  Other findings:     LABORATORY PANEL:   CBC No results for input(s): WBC, HGB, HCT, PLT in the last 168 hours. ------------------------------------------------------------------------------------------------------------------  Chemistries  No results for input(s): NA, K, CL, CO2, GLUCOSE, BUN, CREATININE, CALCIUM, MG, AST, ALT, ALKPHOS, BILITOT in the last 168 hours.  Invalid input(s): GFRCGP ------------------------------------------------------------------------------------------------------------------  Cardiac Enzymes No results for input(s): TROPONINI in the last 168 hours. ------------------------------------------------------------  RADIOLOGY:  No results found.     Thank  you for the consultation and for allowing [facility] to assist in the care of your patient. Our recommendations are noted above.  Please contact us if we can be of further service.   Marda Stalker, MD.  Board Certified in Internal Medicine, Pulmonary Medicine, [place], and Sleep  Medicine.  [facility] Office Number: [phone]  David Kasa, M.D.  David Simonds, M.D  08/25/2017 

## 2017-08-25 ENCOUNTER — Ambulatory Visit (INDEPENDENT_AMBULATORY_CARE_PROVIDER_SITE_OTHER): Payer: 59 | Admitting: Internal Medicine

## 2017-08-25 ENCOUNTER — Institutional Professional Consult (permissible substitution): Payer: 59 | Admitting: Internal Medicine

## 2017-08-25 ENCOUNTER — Encounter: Payer: Self-pay | Admitting: Internal Medicine

## 2017-08-25 ENCOUNTER — Other Ambulatory Visit: Payer: Self-pay | Admitting: Internal Medicine

## 2017-08-25 VITALS — BP 114/70 | HR 74 | Ht 64.0 in

## 2017-08-25 DIAGNOSIS — R042 Hemoptysis: Secondary | ICD-10-CM | POA: Diagnosis not present

## 2017-08-25 DIAGNOSIS — R911 Solitary pulmonary nodule: Secondary | ICD-10-CM

## 2017-08-25 NOTE — Patient Instructions (Addendum)
--  Will set up for bronchoscopy.

## 2017-08-26 ENCOUNTER — Telehealth: Payer: Self-pay | Admitting: *Deleted

## 2017-08-26 NOTE — Telephone Encounter (Signed)
Left vm for patient regarding recommendations from Dr. Grayland Ormond. Per Dr. Grayland Ormond he agrees with plan by pulmonology at this time and would encourage patient to pursue bronchoscopy.

## 2017-08-26 NOTE — Telephone Encounter (Signed)
Pt informed bronch scheduled 08/31/2017 she needs to reports to [facility] @ 12. NPO after midnight and she must have driver to stay during procedure. Patient still questioning if this is necessary and has left 2 message with Dr. Gary Fleet office. She will call back if he tells her to wait. Nothing further needed.

## 2017-08-27 DIAGNOSIS — D225 Melanocytic nevi of trunk: Secondary | ICD-10-CM | POA: Diagnosis not present

## 2017-08-31 ENCOUNTER — Encounter
Admission: RE | Disposition: A | Discharge status: Home / Self Care | Payer: Self-pay | Source: Ambulatory Visit | Attending: Internal Medicine

## 2017-08-31 ENCOUNTER — Encounter: Payer: Self-pay | Admitting: Emergency Medicine

## 2017-08-31 ENCOUNTER — Ambulatory Visit
Admission: RE | Admit: 2017-08-31 | Discharge: 2017-08-31 | Disposition: A | Discharge status: Home / Self Care | Payer: Commercial Managed Care - HMO | Source: Ambulatory Visit | Attending: Internal Medicine | Admitting: Internal Medicine

## 2017-08-31 DIAGNOSIS — Z823 Family history of stroke: Secondary | ICD-10-CM | POA: Diagnosis not present

## 2017-08-31 DIAGNOSIS — Z923 Personal history of irradiation: Secondary | ICD-10-CM | POA: Diagnosis not present

## 2017-08-31 DIAGNOSIS — Z8262 Family history of osteoporosis: Secondary | ICD-10-CM | POA: Insufficient documentation

## 2017-08-31 DIAGNOSIS — Z8249 Family history of ischemic heart disease and other diseases of the circulatory system: Secondary | ICD-10-CM | POA: Diagnosis not present

## 2017-08-31 DIAGNOSIS — E860 Dehydration: Secondary | ICD-10-CM | POA: Diagnosis not present

## 2017-08-31 DIAGNOSIS — Z820 Family history of epilepsy and other diseases of the nervous system: Secondary | ICD-10-CM | POA: Diagnosis not present

## 2017-08-31 DIAGNOSIS — Z853 Personal history of malignant neoplasm of breast: Secondary | ICD-10-CM | POA: Insufficient documentation

## 2017-08-31 DIAGNOSIS — J42 Unspecified chronic bronchitis: Secondary | ICD-10-CM | POA: Diagnosis not present

## 2017-08-31 DIAGNOSIS — J984 Other disorders of lung: Secondary | ICD-10-CM | POA: Diagnosis not present

## 2017-08-31 DIAGNOSIS — Z8261 Family history of arthritis: Secondary | ICD-10-CM | POA: Insufficient documentation

## 2017-08-31 DIAGNOSIS — Z9221 Personal history of antineoplastic chemotherapy: Secondary | ICD-10-CM | POA: Insufficient documentation

## 2017-08-31 DIAGNOSIS — Z79899 Other long term (current) drug therapy: Secondary | ICD-10-CM | POA: Insufficient documentation

## 2017-08-31 DIAGNOSIS — G629 Polyneuropathy, unspecified: Secondary | ICD-10-CM | POA: Insufficient documentation

## 2017-08-31 DIAGNOSIS — R042 Hemoptysis: Secondary | ICD-10-CM

## 2017-08-31 HISTORY — PX: FLEXIBLE BRONCHOSCOPY: SHX5094

## 2017-08-31 SURGERY — BRONCHOSCOPY, FLEXIBLE
Anesthesia: Moderate Sedation

## 2017-08-31 MED ORDER — FENTANYL CITRATE (PF) 100 MCG/2ML IJ SOLN
INTRAMUSCULAR | Status: DC | PRN
  Administered 2017-08-31: 50 ug via INTRAVENOUS

## 2017-08-31 MED ORDER — MIDAZOLAM HCL 2 MG/2ML IJ SOLN
INTRAMUSCULAR | Status: AC | PRN
  Administered 2017-08-31 (×2): 2 mg via INTRAVENOUS

## 2017-08-31 MED ORDER — MIDAZOLAM HCL 5 MG/5ML IJ SOLN
INTRAMUSCULAR | Status: AC | PRN
  Administered 2017-08-31: 2 mg via INTRAVENOUS

## 2017-08-31 MED ORDER — FENTANYL CITRATE (PF) 100 MCG/2ML IJ SOLN
INTRAMUSCULAR | Status: AC | PRN
  Administered 2017-08-31 (×2): 50 ug via INTRAVENOUS

## 2017-08-31 MED ORDER — FENTANYL CITRATE (PF) 100 MCG/2ML IJ SOLN
INTRAMUSCULAR | Status: AC
  Filled 2017-08-31: qty 4

## 2017-08-31 MED ORDER — BUTAMBEN-TETRACAINE-BENZOCAINE 2-2-14 % EX AERO
1.0000 | INHALATION_SPRAY | Freq: Once | CUTANEOUS | Status: DC
  Filled 2017-08-31: qty 20

## 2017-08-31 MED ORDER — PHENYLEPHRINE HCL 0.25 % NA SOLN
1.0000 | Freq: Four times a day (QID) | NASAL | Status: DC | PRN
  Filled 2017-08-31: qty 15

## 2017-08-31 MED ORDER — LIDOCAINE HCL 2 % EX GEL
1.0000 | Freq: Once | CUTANEOUS | Status: DC
Start: 2017-08-31 — End: 2017-08-31

## 2017-08-31 MED ORDER — MIDAZOLAM HCL 5 MG/5ML IJ SOLN
INTRAMUSCULAR | Status: AC
  Filled 2017-08-31: qty 10

## 2017-08-31 NOTE — Interval H&P Note (Signed)
History and Physical Interval Note:  08/31/2017 12:03 PM  Wanda Lopez  has presented today for surgery, with the diagnosis of Regular Bronchoscopy  Lung nodule [facility] emailed per Emelda Fear  The various methods of treatment have been discussed with the patient and family. After consideration of risks, benefits and other options for treatment, the patient has consented to  Procedure(s): FLEXIBLE BRONCHOSCOPY (N/A) as a surgical intervention .  The patient's history has been reviewed, patient examined, no change in status, stable for surgery.  I have reviewed the patient's chart and labs.  Questions were answered to the patient's satisfaction.     Laverle Hobby

## 2017-08-31 NOTE — H&P (View-Only) (Signed)
[facility] Consultation      Assessment and Plan:  Lung nodule/scarring in right lung, with recent episode of hemoptysis. -Discussed various possibilities including chronic scarring from previous radiation, versus atelectasis versus infection. Given the patient's history of breast cancer. She is very worried about the possibility of breast cancer. At this time, these nodules are fairly subtle and may be difficult to biopsy. I offered the patient the possibility of bronchoscopy with brushings and BAL versus repeat CT chest in 12 weeks' time. Given her concerns about cancer, she would prefer to go with the former and undergo bronchoscopy for airway inspection.   Date: 08/25/2017  MRN# [id number] Wanda Lopez 08/28/1961    Wanda Lopez is a 56 y.o. old female seen in consultation for chief complaint of:    Chief Complaint  Patient presents with  . Advice Only    Self referral from ED 08/10/17  . Hemoptysis    Pt was walking to mailbox and started coughing up blood (bright red)    HPI:   She was diagnosed with brca in 2016, she right lumpectomy and internal radiation to the right breast and chemo. 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 She has no respiratory problems other than a cold and lingering cough a year ago.  She take alpha lipoic acid for the past 6 months.  She notes that on 08/10/17 she was walking back from the mailbox when she coughed and noticed bright red blood. She did not have a cough at that time, she did not have any respiratory symptoms at that time. Pt is present today with her sister. She is upset because she thought she would have a repeat CT chest or CXR to r/o cancer. I explained that it is too early for CT chest to be useful, and a CXR would not be helpful, because her infiltrates are too subtle to be reliably picked up by CXR.   She takes occasional tylenol, no aspirin, aleve, motrin. No BC or goody powder.  The problem has never  happened before or since.    Images personally reviewed; CT 08/10/17, very mild scattered infiltrates seen in the right middle lobe. No appreciable lymphadenopathy.  PMHX:   Past Medical History:  Diagnosis Date  . Breast cancer (HCC) 2016   right breast  . 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 Wide excision, sentinel node biopsy.  . Dehydration 05/28/2015  . Fainting spell   . Malignant neoplasm of lower-outer quadrant of right female breast (HCC) 12/17/2015  . Migraines   . Personal history of chemotherapy   . Personal history of radiation therapy    Surgical Hx:  Past Surgical History:  Procedure Laterality Date  . BREAST EXCISIONAL BIOPSY Right 02-21-15   wide excision/sentinel node biopsy.  . BREAST LUMPECTOMY Right   . COLONOSCOPY  2013   Dr Elliott  . PORTACATH PLACEMENT  03-29-15   Dr Byrnett   Family Hx:  Family History  Problem Relation Age of Onset  . Hypertension Mother   . Heart failure Mother   . Osteoarthritis Mother   . Stroke Mother   . Hypertension Father   . Diabetes Father   . Osteoporosis Sister   . Epilepsy Sister   . Breast cancer Neg Hx    Social Hx:   Social History  Substance Use Topics  . Smoking status: Never Smoker  . Smokeless tobacco: Never Used  .   Alcohol use No   Medication:    Current Outpatient Prescriptions:  .  b complex vitamins tablet, Take 1 tablet by mouth daily., Disp: , Rfl:  .  Biotin 1 MG CAPS, Take 3,000 mg by mouth daily. , Disp: , Rfl:  .  Calcium-Vitamin D 600-200 MG-UNIT tablet, Take 1 tablet by mouth daily., Disp: , Rfl:  .  CINNAMON PO, Take 1,000 mg by mouth., Disp: , Rfl:  .  cyanocobalamin 1000 MCG tablet, Take 1,000 mcg by mouth daily., Disp: , Rfl:  .  Flax Oil-Fish Oil-Borage Oil ([employer] OMEGA-3 PO), Take by mouth., Disp: , Rfl:  .  furosemide (LASIX) 20 MG tablet, Take 1 tablet (20 mg total) by mouth daily as needed for  edema., Disp: 60 tablet, Rfl: 2 .  potassium chloride SA (KLOR-CON M20) 20 MEQ tablet, Take 1 tablet (20 mEq total) by mouth 2 (two) times daily., Disp: 180 tablet, Rfl: 1 .  Probiotic Product ([employer] ADV PROBIOTIC GUMMIES PO), Take 2 each by mouth daily., Disp: , Rfl:  .  vitamin E 1000 UNIT capsule, Take 1,000 Units by mouth daily., Disp: , Rfl:  No current facility-administered medications for this visit.   Facility-Administered Medications Ordered in Other Visits:  .  sodium chloride 0.9 % injection 10 mL, 10 mL, Intracatheter, PRN, Lloyd Huger, MD, 10 mL at 06/13/15 1403 .  sodium chloride 0.9 % injection 10 mL, 10 mL, Intravenous, PRN, Lloyd Huger, MD, 10 mL at 10/30/15 1442   Allergies:  Patient has no known allergies.  Review of Systems: Gen:  Denies  fever, sweats, chills HEENT: Denies blurred vision, double vision. bleeds, sore throat Cvc:  No dizziness, chest pain. Resp:   Denies cough or sputum production, shortness of breath Gi: Denies swallowing difficulty, stomach pain. Gu:  Denies bladder incontinence, burning urine Ext:   No Joint pain, stiffness. Skin: No skin rash,  hives  Endoc:  No polyuria, polydipsia. Psych: No depression, insomnia. Other:  All other systems were reviewed with the patient and were negative other that what is mentioned in the HPI.   Physical Examination:   VS: BP 114/70 (BP Location: Left Arm, Cuff Size: Normal)   Pulse 74   Ht 5' 4" (1.626 m)   SpO2 96%   General Appearance: No distress  Neuro:without focal findings,  speech normal,  HEENT: PERRLA, EOM intact.   Pulmonary: normal breath sounds, No wheezing.  CardiovascularNormal S1,S2.  No m/r/g.   Abdomen: Benign, Soft, non-tender. Renal:  No costovertebral tenderness  GU:  No performed at this time. Endoc: No evident thyromegaly, no signs of acromegaly. Skin:   warm, no rashes, no ecchymosis  Extremities: normal, no cyanosis, clubbing.  Other findings:     LABORATORY PANEL:   CBC No results for input(s): WBC, HGB, HCT, PLT in the last 168 hours. ------------------------------------------------------------------------------------------------------------------  Chemistries  No results for input(s): NA, K, CL, CO2, GLUCOSE, BUN, CREATININE, CALCIUM, MG, AST, ALT, ALKPHOS, BILITOT in the last 168 hours.  Invalid input(s): GFRCGP ------------------------------------------------------------------------------------------------------------------  Cardiac Enzymes No results for input(s): TROPONINI in the last 168 hours. ------------------------------------------------------------  RADIOLOGY:  No results found.     Thank  you for the consultation and for allowing [facility] to assist in the care of your patient. Our recommendations are noted above.  Please contact us if we can be of further service.   Marda Stalker, MD.  Board Certified in Internal Medicine, Pulmonary Medicine, [place], and Sleep  Medicine.  [facility] Office Number: [phone]  Patricia Pesa, M.D.  Merton Border, M.D  08/25/2017

## 2017-08-31 NOTE — Op Note (Signed)
  [facility]            Bronchoscopy Note   FINDINGS/SUMMARY:   -Moderate erythema and edema seen throughout both airways with adherent mucus in both areas, consistent with chronic bronchitis. -Mild narrowing of a medial subsegment of the right middle lobe, with mucosa, which bled easily upon contact with the bronchoscope. Multiple brushings taken from multiple sub-segments of the right middle lobe. BAL taken of the right middle lobe.  Indication: Hemoptysis The patient (or their representative) was informed of the risks (including but not limited to bleeding, infection, respiratory failure, lung injury, tooth/oral injury) and benefits of the procedure and gave consent, see chart.   Pre-op diagnosis: Hemoptysis Post-op diagnosis: Chronic bronchitis. Estimated blood loss: 10 mL  Medications for procedure: Versed 8 mg, fentanyl 125 g.  Procedure description: The patient was brought to the bronchoscopy procedure area. Timeout was called before the procedure, consent was signed and in the chart. The right naris were checked for patency, this was anesthetized with topical lidocaine. The bronchoscope was advanced via the right naris to the posterior pharynx, where topical lidocaine was applied. Normal movements of the vocal cords was appreciated. Bronchial scope was passed via the vocal cords to the trachea. An anatomical tour was undertaken. All segments were visualized, there was moderate mucosal edema and moderate mucosal erythema throughout both lungs with adherent mucus seen throughout both lungs. This was suctioned easily. The bronchoscope was then wedged in the right middle lobe where in the subsegment of the medial segment of the right middle lobe narrowed bronchus was noted, there is no endobronchial lesions noted. The mucosa here was friable and bled easily. Upon contact with bronchoscope. Multiple brushings were taken from subsegment of the right middle lobe.  Subsequently BAL was performed in the right middle lobe. As adequate samples appeared to have been taken. The bronchoscope was then removed. The patient was taken to recovery area.    Condition post procedure: Stable   Complications: None noted  Follow-up: The patient will be following up with Dr. Grayland Ormond next week for results.  Marda Stalker, MD.  Board Certified in Internal Medicine, Pulmonary Medicine, [place], and Sleep Medicine.  [facility] Office Number: [phone]  Patricia Pesa, M.D.  Vilinda Boehringer, M.D.  Cheral Marker, M.D  08/31/2017

## 2017-08-31 NOTE — Discharge Instructions (Signed)
Flexible Bronchoscopy, Care After This sheet gives you information about how to care for yourself after your procedure. Your health care provider may also give you more specific instructions. If you have problems or questions, contact your health care provider. What can I expect after the procedure? After the procedure, it is common to have the following symptoms for 24-48 hours:  A cough that is worse than it was before the procedure.  A low-grade fever.  A sore throat or hoarse voice.  Small streaks of blood in the mucus from your lungs (sputum), if tissue samples were removed (biopsy).  Follow these instructions at home: Eating and drinking  Do not eat or drink anything (including water) for 2 hours after your procedure, or until your numbing medicine (local anesthetic) has worn off. Having a numb throat increases your risk of burning yourself or choking.  After your numbness is gone and your cough and gag reflexes have returned, you may start eating only soft foods and slowly drinking liquids.  The day after the procedure, return to your normal diet. Driving  Do not drive for 24 hours if you were given a medicine to help you relax (sedative).  Do not drive or use heavy machinery while taking prescription pain medicine. General instructions  Take over-the-counter and prescription medicines only as told by your health care provider.  Return to your normal activities as told by your health care provider. Ask your health care provider what activities are safe for you.  Do not use any products that contain nicotine or tobacco, such as cigarettes and e-cigarettes. If you need help quitting, ask your health care provider.  Keep all follow-up visits as told by your health care provider. This is important, especially if you had a biopsy taken. Get help right away if:  You have shortness of breath that gets worse.  You become light-headed or feel like you might faint.  You have  chest pain.  You cough up more than a small amount of blood.  The amount of blood you cough up increases. Summary  Common symptoms in the 24-48 hours following a flexible bronchoscopy include cough, low-grade fever, sore throat or hoarse voice, and blood-streaked mucus from the lungs (if you had a biopsy).  Do not eat or drink anything (including water) for 2 hours after your procedure, or until your local anesthetic has worn off. You can return to your normal diet the day after the procedure.  Get help right away if you develop worsening shortness of breath, have chest pain, become light-headed, or cough up more than a small amount of blood. This information is not intended to replace advice given to you by your health care provider. Make sure you discuss any questions you have with your health care provider. Document Released: 06/06/2005 Document Revised: 12/05/2016 Document Reviewed: 12/05/2016 Elsevier Interactive Patient Education  2017 [employer]. Moderate Conscious Sedation, Adult, Care After These instructions provide you with information about caring for yourself after your procedure. Your health care provider may also give you more specific instructions. Your treatment has been planned according to current medical practices, but problems sometimes occur. Call your health care provider if you have any problems or questions after your procedure. What can I expect after the procedure? After your procedure, it is common:  To feel sleepy for several hours.  To feel clumsy and have poor balance for several hours.  To have poor judgment for several hours.  To vomit if you eat too soon.  Follow these instructions at home: For at least 24 hours after the procedure:   Do not: ? Participate in activities where you could fall or become injured. ? Drive. ? Use heavy machinery. ? Drink alcohol. ? Take sleeping pills or medicines that cause drowsiness. ? Make important decisions  or sign legal documents. ? Take care of children on your own.  Rest. Eating and drinking  Follow the diet recommended by your health care provider.  If you vomit: ? Drink water, juice, or soup when you can drink without vomiting. ? Make sure you have little or no nausea before eating solid foods. General instructions  Have a responsible adult stay with you until you are awake and alert.  Take over-the-counter and prescription medicines only as told by your health care provider.  If you smoke, do not smoke without supervision.  Keep all follow-up visits as told by your health care provider. This is important. Contact a health care provider if:  You keep feeling nauseous or you keep vomiting.  You feel light-headed.  You develop a rash.  You have a fever. Get help right away if:  You have trouble breathing. This information is not intended to replace advice given to you by your health care provider. Make sure you discuss any questions you have with your health care provider. Document Released: 09/07/2013 Document Revised: 04/21/2016 Document Reviewed: 03/08/2016 Elsevier Interactive Patient Education  [employer].

## 2017-08-31 NOTE — Progress Notes (Deleted)
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  INTERVAL HISTORY:   Patient returns to clinic toay for routine 6 month follow-up  She continues to have a mild peripheral neuropathy that is being managed by neurology.She has discontinued gabapentin. She otherwise feels well and is asymptomatic. She does not complain of weakness or fatigue today. She does not complain of dizziness today. She has no other neurologic complaints. She denies any recent fevers. She denies any chest pain or shortness of breath. She denies any vomiting, constipation, or diarrhea. She has no urinary complaints. Patient offers no further specific complaints today.  REVIEW OF SYSTEMS:   Review of Systems  Constitutional: Negative.  Negative for fever, malaise/fatigue and weight loss.  Respiratory: Negative for cough and shortness of breath.   Cardiovascular: Negative.  Negative for chest pain and leg swelling.  Gastrointestinal: Negative.  Negative for abdominal pain.  Genitourinary: Negative.   Musculoskeletal: Negative.   Neurological: Positive for sensory change. Negative for weakness.  Psychiatric/Behavioral: Negative.  The patient is not nervous/anxious.    As per HPI. Otherwise, a complete review of systems is negative.   PAST MEDICAL HISTORY: Past Medical History:  Diagnosis Date  . Breast cancer ([place]) 2016   right breast  . 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 Wide excision, sentinel node biopsy.  . Dehydration 05/28/2015  . Fainting spell   . Malignant neoplasm of lower-outer quadrant of right female breast ([place]) 12/17/2015  . Migraines   . Personal history of chemotherapy   . Personal history of radiation therapy     PAST SURGICAL HISTORY: Past Surgical History:  Procedure Laterality Date  . BREAST EXCISIONAL BIOPSY Right 02-21-15   wide excision/sentinel node biopsy.  Marland Kitchen BREAST LUMPECTOMY Right   . COLONOSCOPY  2013   Dr Vira Agar  . PORTACATH PLACEMENT  03-29-15   Dr Bary Castilla    FAMILY HISTORY Family History  Problem Relation Age of Onset  . Hypertension Mother   . Heart failure Mother   . Osteoarthritis Mother   . Stroke Mother   . Hypertension Father   . Diabetes Father   . Osteoporosis Sister   . Epilepsy Sister   . Breast cancer Neg Hx     GYNECOLOGIC HISTORY:  No LMP recorded. Patient is not currently having periods (Reason: Chemotherapy).     ADVANCED DIRECTIVES:    HEALTH MAINTENANCE: Social History  Substance Use Topics  . Smoking status: Never Smoker  . Smokeless tobacco: Never Used  . Alcohol use No     Colonoscopy:  PAP:  Bone density:  Lipid panel:  No Known Allergies  Current Outpatient Prescriptions  Medication Sig Dispense Refill  . b complex vitamins tablet Take 2 tablets by mouth daily.    . Biotin 3 MG TABS Take 3 mg by mouth daily.    . Calcium Carb-Cholecalciferol (CALCIUM 600+D) 600-800 MG-UNIT TABS Take 1 tablet by mouth daily.    Marland Kitchen CINNAMON PO Take 1,000 mg by mouth daily.     Marland Kitchen  furosemide (LASIX) 20 MG tablet Take 1 tablet (20 mg total) by mouth daily as needed for edema. (Patient taking differently: Take 20 mg by mouth daily. ) 60 tablet 2  . Omega-3 Fatty Acids (OMEGA 3 500) 500 MG CAPS Take 500 mg by mouth daily.    . potassium chloride SA (KLOR-CON M20) 20 MEQ tablet Take 1 tablet (20 mEq total) by mouth 2 (two) times daily. (Patient taking  differently: Take 20 mEq by mouth daily. ) 180 tablet 1  . Probiotic Product ([employer]S ADV PROBIOTIC GUMMIES PO) Take 2 tablets by mouth daily.     . vitamin B-12 (CYANOCOBALAMIN) 1000 MCG tablet Take 1,000 mcg by mouth daily.    . vitamin E 1000 UNIT capsule Take 1,000 Units by mouth daily.     No current facility-administered medications for this visit.    Facility-Administered Medications Ordered in Other Visits  Medication Dose Route Frequency Provider Last Rate Last Dose  . sodium chloride 0.9 % injection 10 mL  10 mL Intracatheter PRN Lloyd Huger, MD   10 mL at 06/13/15 1403  . sodium chloride 0.9 % injection 10 mL  10 mL Intravenous PRN Lloyd Huger, MD   10 mL at 10/30/15 1442    OBJECTIVE: There were no vitals taken for this visit.   There is no height or weight on file to calculate BMI.    ECOG FS:0 - Asymptomatic  General: Well-developed, well-nourished, no acute distress. Eyes: Pink conjunctiva, anicteric sclera. Breasts: Patient refused exam today. Lungs: Clear to auscultation bilaterally. Heart: Regular rate and rhythm. No rubs, murmurs, or gallops. Abdomen: Soft, nontender, nondistended. No organomegaly noted, normoactive bowel sounds. Musculoskeletal: No edema, cyanosis, or clubbing. Neuro: Alert, answering all questions appropriately. Cranial nerves grossly intact. Skin: No rashes or petechiae note. Psych: Normal affect.  LAB RESULTS:  No visits with results within 3 Day(s) from this visit.  Latest known visit with results is:  Admission on 08/10/2017, Discharged on 08/10/2017  Component Date Value Ref Range Status  . Sodium 08/10/2017 138  135 - 145 mmol/L Final  . Potassium 08/10/2017 3.4* 3.5 - 5.1 mmol/L Final  . Chloride 08/10/2017 101  101 - 111 mmol/L Final  . CO2 08/10/2017 28  22 - 32 mmol/L Final  . Glucose, Bld 08/10/2017 107* 65 - 99 mg/dL Final  . BUN 08/10/2017 12  6 - 20 mg/dL Final  . Creatinine, Ser 08/10/2017 0.57  0.44 - 1.00 mg/dL  Final  . Calcium 08/10/2017 9.7  8.9 - 10.3 mg/dL Final  . GFR calc non Af Amer 08/10/2017 >60  >60 mL/min Final  . GFR calc Af Amer 08/10/2017 >60  >60 mL/min Final   Comment: (NOTE) The eGFR has been calculated using the CKD EPI equation. This calculation has not been validated in all clinical situations. eGFR's persistently <60 mL/min signify possible Chronic Kidney Disease.   . Anion gap 08/10/2017 9  5 - 15 Final  . WBC 08/10/2017 6.2  3.6 - 11.0 K/uL Final  . RBC 08/10/2017 4.19  3.80 - 5.20 MIL/uL Final  . Hemoglobin 08/10/2017 14.1  12.0 - 16.0 g/dL Final  . HCT 08/10/2017 41.0  35.0 - 47.0 % Final  . MCV 08/10/2017 97.9  80.0 - 100.0 fL Final  . MCH 08/10/2017 33.7  26.0 - 34.0 pg Final  . MCHC 08/10/2017 34.5  32.0 - 36.0 g/dL Final  . RDW 08/10/2017 13.3  11.5 - 14.5 % Final  . Platelets 08/10/2017 170  150 - 440 K/uL Final  . Troponin I 08/10/2017 <0.03  <0.03 ng/mL Final   Lab Results  Component Value Date   LABCA2 24.7 03/17/2017    STUDIES: No results found.  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  PLAN:   1. 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 Patient is status post wide excision, MammoSite therapy, chemotherapy with Adriamycin and Cytoxan, as well as 12 weeks of Taxol. Patient completed her chemotherapy on September 17, 2015.  Patient's most recent mammogram on December 19, 2016 was reported as BI-RADS 2, repeat in January 2019. Patient's CA-27-29 also continues to be within normal limits. Return to clinic in 6 months for further evaluation. 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 Patient has discontinued Cymbalta and gabapentin. Continue follow-up and management per neurology.   Patient expressed understanding and was in agreement with this plan. She also understands that She can call clinic at any time with any questions,  concerns, or complaints.    Breast cancer [facility])   Staging form: Breast, AJCC 7th Edition     Clinical stage from 03/26/2015: Stage IA (T1c, N0, M0) - Signed by Lloyd Huger, MD on 03/26/2015   Lloyd Huger, MD   08/31/2017 11:19 AM

## 2017-08-31 NOTE — Progress Notes (Signed)
Pt sitting up in bed, drinking juice at this time in NAD.  VSS at this time.  Sister at bedside.  Discharge and follow up instructions reviewed with pt and sister, both verbalize understanding.  Dr. Ashby Dawes at bedside earlier to discuss procedure and follow up.

## 2017-09-01 ENCOUNTER — Encounter: Payer: Self-pay | Admitting: Internal Medicine

## 2017-09-01 LAB — CYTOLOGY - NON PAP

## 2017-09-02 ENCOUNTER — Inpatient Hospital Stay: Payer: 59 | Admitting: Oncology

## 2017-09-02 ENCOUNTER — Inpatient Hospital Stay: Payer: 59

## 2017-09-02 LAB — ACID FAST SMEAR (AFB): ACID FAST SMEAR - AFSCU2: NEGATIVE

## 2017-09-02 LAB — ACID FAST SMEAR (AFB, MYCOBACTERIA)

## 2017-09-03 LAB — CULTURE, RESPIRATORY

## 2017-09-03 LAB — CULTURE, RESPIRATORY W GRAM STAIN: Culture: NO GROWTH

## 2017-09-06 NOTE — Progress Notes (Signed)
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  INTERVAL HISTORY: Patient seen in clinic today as an add on after being evaluated in the emergency room for hemoptysis and subsequent bronchoscopy. She is anxious, but feels back to her baseline. She has had no further bleeding. She continues to have a mild peripheral neuropathy that is being managed by neurology. She does not complain of dizziness today. She has no other neurologic complaints. She denies any recent fevers. She denies any chest pain, cough, or shortness of breath. She denies any vomiting, constipation, or diarrhea. She has no urinary complaints. Patient offers no further specific complaints today.  REVIEW OF SYSTEMS:   Review of Systems  Constitutional: Negative.  Negative for fever, malaise/fatigue and weight loss.  Respiratory: Positive for hemoptysis. Negative for cough and shortness of breath.   Cardiovascular: Negative.  Negative for chest pain and leg swelling.  Gastrointestinal: Negative.  Negative for abdominal pain.  Genitourinary: Negative.   Musculoskeletal: Negative.   Skin: Negative.  Negative for rash.  Neurological: Positive for sensory change. Negative for weakness.  Psychiatric/Behavioral: The patient is nervous/anxious.    As per HPI. Otherwise, a complete review of systems is negative.   PAST MEDICAL HISTORY: Past Medical History:  Diagnosis Date  . Breast cancer ([place]) 2016   right breast  . 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 Wide excision, sentinel node biopsy.  . Dehydration 05/28/2015  . Fainting spell   . Malignant neoplasm of lower-outer quadrant of right female breast ([place]) 12/17/2015  . Migraines   . Personal history of chemotherapy   . Personal history of radiation therapy     PAST SURGICAL HISTORY: Past Surgical History:  Procedure Laterality Date  . BREAST EXCISIONAL BIOPSY Right 02-21-15   wide excision/sentinel node biopsy.  Marland Kitchen BREAST LUMPECTOMY Right   . COLONOSCOPY  2013   Dr Vira Agar  . FLEXIBLE BRONCHOSCOPY N/A 08/31/2017   Procedure: FLEXIBLE BRONCHOSCOPY;  Surgeon: Laverle Hobby, MD;  Location: [facility] ORS;  Service: Pulmonary;  Laterality: N/A;  . PORTACATH PLACEMENT  03-29-15   Dr Bary Castilla    FAMILY HISTORY Family History  Problem Relation Age of Onset  . Hypertension Mother   . Heart failure Mother   . Osteoarthritis Mother   . Stroke Mother   . Hypertension Father   . Diabetes Father   . Osteoporosis Sister   . Epilepsy Sister   . Breast cancer Neg Hx     GYNECOLOGIC HISTORY:  No LMP recorded. Patient is not currently having periods (Reason: Chemotherapy).     ADVANCED DIRECTIVES:    HEALTH MAINTENANCE: Social History  Substance Use Topics  . Smoking status: Never Smoker  . Smokeless tobacco: Never Used  . Alcohol use No     Colonoscopy:  PAP:  Bone density:  Lipid panel:  No Known Allergies  Current Outpatient Prescriptions  Medication Sig Dispense Refill  . b complex vitamins tablet Take 2 tablets by mouth daily.    Marland Kitchen  Biotin 3 MG TABS Take 3 mg by mouth daily.    . Calcium Carb-Cholecalciferol (CALCIUM 600+D) 600-800 MG-UNIT TABS Take 1 tablet by mouth daily.    Marland Kitchen CINNAMON PO Take 1,000 mg by mouth daily.     . furosemide (LASIX) 20 MG tablet Take 1 tablet (20 mg total) by mouth daily as needed for edema. (Patient taking differently: Take  20 mg by mouth daily. ) 60 tablet 2  . Omega-3 Fatty Acids (OMEGA 3 500) 500 MG CAPS Take 500 mg by mouth daily.    . potassium chloride SA (KLOR-CON M20) 20 MEQ tablet Take 1 tablet (20 mEq total) by mouth 2 (two) times daily. (Patient taking differently: Take 20 mEq by mouth daily. ) 180 tablet 1  . Probiotic Product ([facility] ADV PROBIOTIC GUMMIES PO) Take 2 tablets by mouth daily.     . vitamin B-12 (CYANOCOBALAMIN) 1000 MCG tablet Take 1,000 mcg by mouth daily.    . vitamin E 1000 UNIT capsule Take 1,000 Units by mouth daily.     No current facility-administered medications for this visit.    Facility-Administered Medications Ordered in Other Visits  Medication Dose Route Frequency Provider Last Rate Last Dose  . sodium chloride 0.9 % injection 10 mL  10 mL Intracatheter PRN Lloyd Huger, MD   10 mL at 06/13/15 1403  . sodium chloride 0.9 % injection 10 mL  10 mL Intravenous PRN Lloyd Huger, MD   10 mL at 10/30/15 1442    OBJECTIVE: BP 101/66 (BP Location: Left Arm, Patient Position: Sitting)   Pulse 68   Temp 97.6 F (36.4 C) (Tympanic)   Resp 18   Wt 155 lb 4.8 oz (70.4 kg)   BMI 26.66 kg/m    Body mass index is 26.66 kg/m.    ECOG FS:0 - Asymptomatic  General: Well-developed, well-nourished, no acute distress. Eyes: Pink conjunctiva, anicteric sclera. Breasts: Patient refused exam today. Lungs: Clear to auscultation bilaterally. Heart: Regular rate and rhythm. No rubs, murmurs, or gallops. Abdomen: Soft, nontender, nondistended. No organomegaly noted, normoactive bowel sounds. Musculoskeletal: No edema, cyanosis, or clubbing. Neuro: Alert, answering all questions appropriately. Cranial nerves grossly intact. Skin: No rashes or petechiae note. Psych: Normal affect.  LAB RESULTS:  Clinical Support on 09/09/2017  Component Date Value Ref Range Status  . CA 27.29 09/09/2017 21.2  0.0 - 38.6 U/mL Final   Comment: (NOTE) Bayer Centaur/ACS methodology Performed  At: [facility] [place], [place] [id number] Lindon Romp MD GQ:[id number]    Lab Results  Component Value Date   LABCA2 24.7 03/17/2017    STUDIES: No results found.  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  PLAN:   1. 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 Patient is status post wide excision, MammoSite therapy, chemotherapy with Adriamycin and Cytoxan, as well as 12 weeks of Taxol. Patient completed her chemotherapy on September 17, 2015.  Patient's most recent mammogram on December 19, 2016 was reported as BI-RADS 2, repeat in January 2019. Patient's CA-27-29 also continues to be within normal limits.  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 Patient has discontinued Cymbalta and gabapentin. Continue follow-up and management per neurology.  3. Hemoptysis: Unclear etiology.  CT scan of the chest on August 10, 2017 review independently only showing a ground glass opacity in the right lung, likely infectious in nature.  Patient also underwent  bronchoscopy which was unrevealing. Will repeat CT scan in mid-December to assess interval change. Patient will follow up 1-2 days later for further evaluation and discussion of the results.  Approximately 30 minutes was spent in discussion of which greater than 50% was consultation.  Patient expressed understanding and was in agreement with this plan. She also understands that She can call clinic at any time with any questions, concerns, or complaints.    Breast cancer [facility])   Staging form: Breast, AJCC 7th Edition     Clinical stage from 03/26/2015: Stage IA (T1c, N0, M0) - Signed by Lloyd Huger, MD on 03/26/2015   Lloyd Huger, MD   09/12/2017 9:02 AM

## 2017-09-09 ENCOUNTER — Inpatient Hospital Stay: Payer: 59 | Attending: Oncology

## 2017-09-09 ENCOUNTER — Inpatient Hospital Stay (HOSPITAL_BASED_OUTPATIENT_CLINIC_OR_DEPARTMENT_OTHER): Payer: 59 | Admitting: Oncology

## 2017-09-09 VITALS — BP 101/66 | HR 68 | Temp 97.6°F | Resp 18 | Wt 155.3 lb

## 2017-09-09 DIAGNOSIS — G629 Polyneuropathy, unspecified: Secondary | ICD-10-CM

## 2017-09-09 DIAGNOSIS — R918 Other nonspecific abnormal finding of lung field: Secondary | ICD-10-CM

## 2017-09-09 DIAGNOSIS — Z171 Estrogen receptor negative status [ER-]: Secondary | ICD-10-CM | POA: Diagnosis not present

## 2017-09-09 DIAGNOSIS — Z9221 Personal history of antineoplastic chemotherapy: Secondary | ICD-10-CM

## 2017-09-09 DIAGNOSIS — C50511 Malignant neoplasm of lower-outer quadrant of right female breast: Secondary | ICD-10-CM | POA: Insufficient documentation

## 2017-09-09 DIAGNOSIS — R042 Hemoptysis: Secondary | ICD-10-CM | POA: Insufficient documentation

## 2017-09-09 DIAGNOSIS — R911 Solitary pulmonary nodule: Secondary | ICD-10-CM

## 2017-09-09 DIAGNOSIS — Z79899 Other long term (current) drug therapy: Secondary | ICD-10-CM | POA: Diagnosis not present

## 2017-09-09 LAB — VIRUS CULTURE

## 2017-09-09 NOTE — Progress Notes (Signed)
Here for follow up. . Per pt to ER  Sept 10 w episode of " coughing up blood "  X 1 day.  Had bronchoscop also-saw pulmonologist -Dr Juanell Fairly.

## 2017-09-10 LAB — CANCER ANTIGEN 27.29: CAN 27.29: 21.2 U/mL (ref 0.0–38.6)

## 2017-09-12 LAB — MISC LABCORP TEST (SEND OUT): Labcorp test code: 8474

## 2017-09-15 DIAGNOSIS — Z23 Encounter for immunization: Secondary | ICD-10-CM | POA: Diagnosis not present

## 2017-09-16 ENCOUNTER — Other Ambulatory Visit: Payer: 59

## 2017-09-16 ENCOUNTER — Ambulatory Visit: Payer: 59 | Admitting: Oncology

## 2017-09-22 ENCOUNTER — Telehealth: Payer: Self-pay | Admitting: Family Medicine

## 2017-09-22 NOTE — Telephone Encounter (Signed)
Pt called and stated that she received her flu shot on 12/16/16 at [facility]. Please advise, thank you!

## 2017-09-22 NOTE — Telephone Encounter (Signed)
Yes sorry typo

## 2017-09-22 NOTE — Telephone Encounter (Signed)
Do you mean 09/15/17?

## 2017-10-01 LAB — CULTURE, FUNGUS WITHOUT SMEAR

## 2017-10-12 ENCOUNTER — Other Ambulatory Visit: Payer: Self-pay | Admitting: Family Medicine

## 2017-10-12 MED ORDER — POTASSIUM CHLORIDE CRYS ER 20 MEQ PO TBCR: 20.0000 meq | EXTENDED_RELEASE_TABLET | Freq: Two times a day (BID) | ORAL | 1 refills | Status: DC

## 2017-10-12 NOTE — Telephone Encounter (Signed)
Refill sent as requested. 

## 2017-10-12 NOTE — Telephone Encounter (Signed)
Copied from [place] [id number]. Topic: Inquiry >> Oct 12, 2017 11:36 AM Oliver Pila B wrote: Reason for CRM: PT called to get a refill of her Klor-Con, contact pt if needed or have pharmacy contact when Rx is ready

## 2017-10-13 LAB — MOLD ORGANISM REFLEX

## 2017-10-13 LAB — ORGANISM IDENTIFICATION, MOLD

## 2017-10-18 LAB — ACID FAST CULTURE WITH REFLEXED SENSITIVITIES (MYCOBACTERIA): Acid Fast Culture: NEGATIVE

## 2017-11-10 ENCOUNTER — Ambulatory Visit: Admission: RE | Admit: 2017-11-10 | Payer: 59 | Source: Ambulatory Visit

## 2017-11-10 ENCOUNTER — Ambulatory Visit
Admission: RE | Admit: 2017-11-10 | Discharge: 2017-11-10 | Disposition: A | Discharge status: Home / Self Care | Payer: Commercial Managed Care - HMO | Source: Ambulatory Visit | Attending: Oncology | Admitting: Oncology

## 2017-11-10 DIAGNOSIS — R911 Solitary pulmonary nodule: Secondary | ICD-10-CM | POA: Diagnosis not present

## 2017-11-10 DIAGNOSIS — I700 Atherosclerosis of aorta: Secondary | ICD-10-CM | POA: Diagnosis not present

## 2017-11-10 DIAGNOSIS — R918 Other nonspecific abnormal finding of lung field: Secondary | ICD-10-CM | POA: Diagnosis present

## 2017-11-10 DIAGNOSIS — M5134 Other intervertebral disc degeneration, thoracic region: Secondary | ICD-10-CM | POA: Diagnosis not present

## 2017-11-10 MED ORDER — IOPAMIDOL (ISOVUE-300) INJECTION 61%
75.0000 mL | Freq: Once | INTRAVENOUS | Status: AC | PRN
  Administered 2017-11-10: 75 mL via INTRAVENOUS

## 2017-11-11 NOTE — Progress Notes (Signed)
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  INTERVAL HISTORY: Patient seen in clinic for further evaluation and discussion of her imaging results.  She currently feels well and is asymptomatic.  She has no further cough or hemoptysis.  She continues to have a mild peripheral neuropathy that is being managed by neurology. She does not complain of dizziness today. She has no other neurologic complaints. She denies any recent fevers. She denies any chest pain or shortness of breath. She denies any vomiting, constipation, or diarrhea. She has no urinary complaints. Patient offers no specific complaints today.  REVIEW OF SYSTEMS:   Review of Systems  Constitutional: Negative.  Negative for fever, malaise/fatigue and weight loss.  Respiratory: Negative.  Negative for cough, hemoptysis and shortness of breath.   Cardiovascular: Negative.  Negative for chest pain and leg swelling.  Gastrointestinal: Negative.  Negative for abdominal pain.  Genitourinary: Negative.   Musculoskeletal: Negative.   Skin: Negative.  Negative for rash.  Neurological: Positive for sensory change. Negative for weakness.  Psychiatric/Behavioral: The patient is nervous/anxious.    As per HPI. Otherwise, a complete review of systems is negative.   PAST MEDICAL HISTORY: Past Medical History:  Diagnosis Date  . Breast cancer ([place]) 2016   right breast  . 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 Wide excision, sentinel node biopsy.  . Dehydration 05/28/2015  . Fainting spell   . Malignant neoplasm of lower-outer quadrant of right female breast ([place]) 12/17/2015  . Migraines   . Personal history of chemotherapy   . Personal history of radiation therapy     PAST SURGICAL HISTORY: Past Surgical History:  Procedure Laterality Date  . BREAST EXCISIONAL BIOPSY Right 02-21-15   wide excision/sentinel node biopsy.  Marland Kitchen BREAST LUMPECTOMY Right   . COLONOSCOPY  2013   Dr Vira Agar  . FLEXIBLE BRONCHOSCOPY N/A 08/31/2017   Procedure: FLEXIBLE BRONCHOSCOPY;  Surgeon: Laverle Hobby, MD;  Location: [facility] ORS;  Service: Pulmonary;  Laterality: N/A;  . PORTACATH PLACEMENT  03-29-15   Dr Bary Castilla    FAMILY HISTORY Family History  Problem Relation Age of Onset  . Hypertension Mother   . Heart failure Mother   . Osteoarthritis Mother   . Stroke Mother   . Hypertension Father   . Diabetes Father   . Osteoporosis Sister   . Epilepsy Sister   . Breast cancer Neg Hx     GYNECOLOGIC HISTORY:  No LMP recorded. Patient is not currently having periods (Reason: Chemotherapy).     ADVANCED DIRECTIVES:    HEALTH MAINTENANCE: Social History   Tobacco Use  . Smoking status: Never Smoker  . Smokeless tobacco: Never Used  Substance Use Topics  . Alcohol use: No    Alcohol/week: 0.0 oz  . Drug use: No     Colonoscopy:  PAP:  Bone density:  Lipid panel:  No Known Allergies  Current Outpatient Medications  Medication Sig Dispense Refill  . b complex vitamins tablet Take 2 tablets  by mouth daily.    . Biotin 3 MG TABS Take 3 mg by mouth daily.    . Calcium Carb-Cholecalciferol (CALCIUM 600+D) 600-800 MG-UNIT TABS Take 1 tablet by mouth daily.    Marland Kitchen CINNAMON PO Take 1,000 mg by mouth daily.     . furosemide (LASIX) 20 MG tablet Take 1 tablet (20 mg total) by mouth daily as needed for edema. (Patient taking differently:  Take 20 mg by mouth daily. ) 60 tablet 2  . Omega-3 Fatty Acids (OMEGA 3 500) 500 MG CAPS Take 500 mg by mouth daily.    . potassium chloride SA (KLOR-CON M20) 20 MEQ tablet Take 1 tablet (20 mEq total) 2 (two) times daily by mouth. 180 tablet 1  . Probiotic Product ([facility] ADV PROBIOTIC GUMMIES PO) Take 2 tablets by mouth daily.     . vitamin B-12 (CYANOCOBALAMIN) 1000 MCG tablet Take 1,000 mcg by mouth daily.    . vitamin E 1000 UNIT capsule Take 1,000 Units by mouth daily.     No current facility-administered medications for this visit.    Facility-Administered Medications Ordered in Other Visits  Medication Dose Route Frequency Provider Last Rate Last Dose  . sodium chloride 0.9 % injection 10 mL  10 mL Intracatheter PRN Lloyd Huger, MD   10 mL at 06/13/15 1403  . sodium chloride 0.9 % injection 10 mL  10 mL Intravenous PRN Lloyd Huger, MD   10 mL at 10/30/15 1442    OBJECTIVE: BP 104/72 (BP Location: Left Arm, Patient Position: Sitting)   Pulse 75   Temp (!) 97 F (36.1 C) (Tympanic)   Resp 18   Wt 154 lb 3.2 oz (69.9 kg)   BMI 26.47 kg/m    Body mass index is 26.47 kg/m.    ECOG FS:0 - Asymptomatic  General: Well-developed, well-nourished, no acute distress. Eyes: Pink conjunctiva, anicteric sclera. Breasts: Patient refused exam today. Lungs: Clear to auscultation bilaterally. Heart: Regular rate and rhythm. No rubs, murmurs, or gallops. Abdomen: Soft, nontender, nondistended. No organomegaly noted, normoactive bowel sounds. Musculoskeletal: No edema, cyanosis, or clubbing. Neuro: Alert, answering all questions appropriately. Cranial nerves grossly intact. Skin: No rashes or petechiae note. Psych: Normal affect.  LAB RESULTS:  No visits with results within 3 Day(s) from this visit.  Latest known visit with results is:  Clinical Support on 09/09/2017  Component Date Value Ref Range Status  . CA 27.29 09/09/2017 21.2  0.0 - 38.6 U/mL Final   Comment:  (NOTE) Bayer Centaur/ACS methodology Performed At: [facility] [place], [place] [id number] Lindon Romp MD TO:[id number]   . [facility] test code 08/31/2017 [id number]   Final  . [facility] test name 08/31/2017 MOLD IDENTIFICATION   Final  . Source ([facility]) 08/31/2017 bronchoalveolar lavage   Final  . Misc [facility] result 08/31/2017 COMMENT   Final   Comment: (NOTE) Performed At: [facility] [place], [place] [id number] Lindon Romp MD LP:[id number] Performed at [facility], [place] [address], [place], [place] [address]   . Organism Identification, Mold 08/31/2017 Final report   Corrected   Comment: (NOTE) Performed At: [facility] [address] [place], [place] [id number] Rush Farmer MD QQ:[id number] Performed at [facility], [place] [address] [place], [place] [address] CORRECTED ON 11/13 AT 1231: PREVIOUSLY REPORTED AS Preliminary report   . Source of Sample 08/31/2017 BRONCHIAL ALVEOLAR LAVAGE   Final  . Mold Result - 1 08/31/2017 Final  Identification   Corrected   Comment: (NOTE) Identification by sequencing. ASPERGILLUS JAPONICUS Performed At: [facility] [place], [place] [id number] Rush Farmer MD DY:[id number] Performed at [facility], [place] [address], [place],  [address] CORRECTED ON 11/13 AT 1231: PREVIOUSLY REPORTED AS Aspergillus species    Lab Results  Component Value Date   LABCA2 24.7 03/17/2017    STUDIES: No results found.  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  PLAN:   1. 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 Patient is status post wide excision, MammoSite therapy, chemotherapy with Adriamycin and Cytoxan, as well as 12 weeks of Taxol. Patient completed her chemotherapy on September 17, 2015.  Patient's most recent mammogram  on December 19, 2016 was reported as BI-RADS 2, repeat in January 2019. Patient's CA-27-29 also continues to be within normal limits.  Return to clinic in 6 months for routine evaluation. 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 Patient has discontinued Cymbalta and gabapentin. Continue follow-up and management per neurology.  3. Hemoptysis: Resolved. Follow up CT scan from November 10, 2017 revealed complete resolution of ground glass opacities in the right lung.  Patient previously underwent bronchoscopy which was unrevealing. No further imaging is necessary.  Approximately 30 minutes was spent in discussion of which greater than 50% was consultation.  Patient expressed understanding and was in agreement with this plan. She also understands that She can call clinic at any time with any questions, concerns, or complaints.    Breast cancer [facility])   Staging form: Breast, AJCC 7th Edition     Clinical stage from 03/26/2015: Stage IA (T1c, N0, M0) - Signed by Lloyd Huger, MD on 03/26/2015   Lloyd Huger, MD   11/15/2017 12:56 PM

## 2017-11-12 ENCOUNTER — Inpatient Hospital Stay: Payer: 59 | Attending: Oncology | Admitting: Oncology

## 2017-11-12 VITALS — BP 104/72 | HR 75 | Temp 97.0°F | Resp 18 | Wt 154.2 lb

## 2017-11-12 DIAGNOSIS — Z79899 Other long term (current) drug therapy: Secondary | ICD-10-CM

## 2017-11-12 DIAGNOSIS — C50511 Malignant neoplasm of lower-outer quadrant of right female breast: Secondary | ICD-10-CM | POA: Insufficient documentation

## 2017-11-12 DIAGNOSIS — Z171 Estrogen receptor negative status [ER-]: Secondary | ICD-10-CM | POA: Insufficient documentation

## 2017-11-12 DIAGNOSIS — G629 Polyneuropathy, unspecified: Secondary | ICD-10-CM | POA: Insufficient documentation

## 2017-11-12 DIAGNOSIS — Z923 Personal history of irradiation: Secondary | ICD-10-CM | POA: Diagnosis not present

## 2017-11-12 DIAGNOSIS — Z9221 Personal history of antineoplastic chemotherapy: Secondary | ICD-10-CM | POA: Diagnosis not present

## 2017-12-03 ENCOUNTER — Ambulatory Visit
Admission: RE | Admit: 2017-12-03 | Discharge: 2017-12-03 | Disposition: A | Discharge status: Home / Self Care | Payer: 59 | Source: Ambulatory Visit | Attending: Radiation Oncology | Admitting: Radiation Oncology

## 2017-12-03 ENCOUNTER — Other Ambulatory Visit: Payer: Self-pay

## 2017-12-03 ENCOUNTER — Encounter: Payer: Self-pay | Admitting: Radiation Oncology

## 2017-12-03 VITALS — BP 106/69 | HR 72 | Temp 97.3°F | Resp 18 | Wt 154.2 lb

## 2017-12-03 DIAGNOSIS — Z853 Personal history of malignant neoplasm of breast: Secondary | ICD-10-CM | POA: Diagnosis not present

## 2017-12-03 DIAGNOSIS — Z923 Personal history of irradiation: Secondary | ICD-10-CM | POA: Insufficient documentation

## 2017-12-03 DIAGNOSIS — C50511 Malignant neoplasm of lower-outer quadrant of right female breast: Secondary | ICD-10-CM

## 2017-12-03 NOTE — Progress Notes (Signed)
Radiation Oncology Follow up Note  Name: Wanda Lopez   Date:   12/03/2017 MRN:  [id number] DOB: 1961/02/08    This 57 y.o. female presents to the clinic today for 2-1/2 year follow-up status post accelerated partial breast irradiation to her right breast for triple negative invasive mammary carcinoma.  REFERRING PROVIDER: Leone Haven, MD  HPI: Patient is a 57 year old female now out 2 and half years having completed accelerated partial breast radiation to her right breast status post wide local excision for triple negative invasive mammary carcinoma.. Tumor was overall stage I a. She is doing well and is without complaint. She did have episode of hemoptysis which was diagnosis chronic bronchitis and has resolved. Her last mammograms which I have reviewed will back in January were BI-RADS 2 benign.  COMPLICATIONS OF TREATMENT: none  FOLLOW UP COMPLIANCE: keeps appointments   PHYSICAL EXAM:  BP 106/69   Pulse 72   Temp (!) 97.3 F (36.3 C)   Resp 18   Wt 154 lb 3.4 oz (70 kg)   BMI 26.47 kg/m  Lungs are clear to A&P cardiac examination essentially unremarkable with regular rate and rhythm. No dominant mass or nodularity is noted in either breast in 2 positions examined. Incision is well-healed. No axillary or supraclavicular adenopathy is appreciated. Cosmetic result is excellent. Well-developed well-nourished patient in NAD. HEENT reveals PERLA, EOMI, discs not visualized.  Oral cavity is clear. No oral mucosal lesions are identified. Neck is clear without evidence of cervical or supraclavicular adenopathy. Lungs are clear to A&P. Cardiac examination is essentially unremarkable with regular rate and rhythm without murmur rub or thrill. Abdomen is benign with no organomegaly or masses noted. Motor sensory and DTR levels are equal and symmetric in the upper and lower extremities. Cranial nerves II through XII are grossly intact. Proprioception is intact. No peripheral adenopathy or  edema is identified. No motor or sensory levels are noted. Crude visual fields are within normal range. RADIOLOGY RESULTS: Mammograms are reviewed and compatible with the above-stated findings   PLAN: Present time she continues to do well with no evidence of disease. I'm please were overall progress. I've asked to see her back in 1 year for follow-up. She is not on antiestrogen therapy based on the triple negative nature of her disease. I've asked to see her back in 1 year for follow-up. She knows to call sooner with any concerns. She is scheduled for follow-up mammograms later this month. I've also asked her to make a follow-up appointment with her surgeon who she is not seen in quite a while.  I would like to take this opportunity to thank you for allowing me to participate in the care of your patient.Noreene Filbert, MD

## 2017-12-21 ENCOUNTER — Ambulatory Visit
Admission: RE | Admit: 2017-12-21 | Discharge: 2017-12-21 | Disposition: A | Discharge status: Home / Self Care | Payer: 59 | Source: Ambulatory Visit | Attending: Oncology | Admitting: Oncology

## 2017-12-21 DIAGNOSIS — R922 Inconclusive mammogram: Secondary | ICD-10-CM | POA: Diagnosis not present

## 2017-12-21 DIAGNOSIS — Z853 Personal history of malignant neoplasm of breast: Secondary | ICD-10-CM | POA: Insufficient documentation

## 2017-12-21 DIAGNOSIS — C50511 Malignant neoplasm of lower-outer quadrant of right female breast: Secondary | ICD-10-CM

## 2018-01-19 ENCOUNTER — Encounter: Payer: Self-pay | Admitting: General Surgery

## 2018-01-19 ENCOUNTER — Ambulatory Visit (INDEPENDENT_AMBULATORY_CARE_PROVIDER_SITE_OTHER): Payer: 59 | Admitting: General Surgery

## 2018-01-19 VITALS — BP 106/64 | HR 76 | Resp 13 | Ht 64.0 in | Wt 158.0 lb

## 2018-01-19 DIAGNOSIS — C50511 Malignant neoplasm of lower-outer quadrant of right female breast: Secondary | ICD-10-CM | POA: Diagnosis not present

## 2018-01-19 DIAGNOSIS — Z171 Estrogen receptor negative status [ER-]: Secondary | ICD-10-CM

## 2018-01-19 NOTE — Patient Instructions (Signed)
The patient has been asked to return to the office in one year with a bilateral diagnostic mammogram.The patient is aware to call back for any questions or concerns. 

## 2018-01-19 NOTE — Progress Notes (Signed)
Patient ID: Wanda Lopez, female   DOB: May 02, 1961, 57 y.o.   MRN: [id number]  Chief Complaint  Patient presents with  . Follow-up    HPI Wanda Lopez is a 57 y.o. female who presents for a breast evaluation. The most recent mammogram was done on 12/21/2017. Patient was seen in the ED on 08/31/2017 for coughing up blood. Bronchoscopy showed actinobacter.  Patient does perform regular self breast checks and gets regular mammograms done.    HPI  Past Medical History:  Diagnosis Date  . Breast cancer ([place]) 2016   right breast- invasive carcinoma  . 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 Wide excision, sentinel node biopsy.  . Dehydration 05/28/2015  . Fainting spell   . Malignant neoplasm of lower-outer quadrant of right female breast ([place]) 12/17/2015  . Migraines   . Personal history of chemotherapy   . Personal history of radiation therapy     Past Surgical History:  Procedure Laterality Date  . BREAST EXCISIONAL BIOPSY Right 02-21-15   wide excision/sentinel node biopsy.  Marland Kitchen BREAST LUMPECTOMY Right 2016   internal radiation, chemo   . COLONOSCOPY  2013   Dr Vira Agar  . FLEXIBLE BRONCHOSCOPY N/A 08/31/2017   Procedure: FLEXIBLE BRONCHOSCOPY;  Surgeon: Laverle Hobby, MD;  Location: [facility] ORS;  Service: Pulmonary;  Laterality: N/A;  . PORTACATH PLACEMENT  03-29-15   Dr Bary Castilla    Family History  Problem Relation Age of Onset  . Hypertension Mother   . Heart failure Mother   . Osteoarthritis Mother   . Stroke Mother   . Hypertension Father   . Diabetes Father   . Osteoporosis Sister   . Epilepsy Sister   . Breast cancer Neg Hx     Social History Social History   Tobacco Use  . Smoking status: Never Smoker  . Smokeless tobacco: Never Used  Substance Use Topics  . Alcohol use: No    Alcohol/week: 0.0 oz  . Drug use: No    No Known Allergies  Current Outpatient  Medications  Medication Sig Dispense Refill  . b complex vitamins tablet Take 2 tablets by mouth daily.    . Biotin 3 MG TABS Take 3 mg by mouth daily.    . Calcium Carb-Cholecalciferol (CALCIUM 600+D) 600-800 MG-UNIT TABS Take 1 tablet by mouth daily.    Marland Kitchen CINNAMON PO Take 1,000 mg by mouth daily.     . furosemide (LASIX) 20 MG tablet Take 1 tablet (20 mg total) by mouth daily as needed for edema. (Patient taking differently: Take 20 mg by mouth daily. ) 60 tablet 2  . Omega-3 Fatty Acids (OMEGA 3 500) 500 MG CAPS Take 500 mg by mouth daily.    . potassium chloride SA (KLOR-CON M20) 20 MEQ tablet Take 1 tablet (20 mEq total) 2 (two) times daily by mouth. 180 tablet 1  . Probiotic Product (CVS ADV PROBIOTIC GUMMIES PO) Take 2 tablets by mouth daily.     . vitamin B-12 (CYANOCOBALAMIN) 1000 MCG tablet Take 1,000 mcg by mouth daily.    . vitamin E 1000 UNIT capsule Take 1,000 Units by mouth daily.     No current facility-administered medications for this visit.    Facility-Administered Medications Ordered in Other Visits  Medication Dose Route Frequency Provider Last Rate Last Dose  . sodium chloride 0.9 % injection 10 mL  10 mL Intracatheter PRN Lloyd Huger, MD   10  mL at 06/13/15 1403  . sodium chloride 0.9 % injection 10 mL  10 mL Intravenous PRN Lloyd Huger, MD   10 mL at 10/30/15 1442    Review of Systems Review of Systems  Constitutional: Negative.   Respiratory: Negative.   Cardiovascular: Negative.     Blood pressure 106/64, pulse 76, resp. rate 13, height _0  (1.626 m), weight 158 lb (71.7 kg).  Physical Exam Physical Exam  Constitutional: She is oriented to person, place, and time. She appears well-developed and well-nourished.  Eyes: Conjunctivae are normal. No scleral icterus.  Neck: Neck supple.  Cardiovascular: Normal rate, regular rhythm and normal heart sounds.  Pulmonary/Chest: Effort normal and breath sounds normal. Right breast exhibits no  inverted nipple, no mass, no nipple discharge, no skin change and no tenderness. Left breast exhibits no inverted nipple, no mass, no nipple discharge, no skin change and no tenderness.    Right breast thickening, well healed incision.   Lymphadenopathy:    She has no cervical adenopathy.    She has no axillary adenopathy.  Neurological: She is alert and oriented to person, place, and time.  Skin: Skin is warm and dry.    Data Reviewed December 21, 2017 mammogram reviewed. BIRAD 2.  Dr. Olena Leatherwood notes of January 2019 reviewed. Dr. Gary Fleet noted of December 2018 reviewed. Patient reports no polyps at 2013 colonoscopy. F.U in 2023.   Assessment    No evidence of recurrent disease.    Plan   The patient has been asked to return to the office in one year with a bilateral diagnostic mammogram.The patient is aware to call back for any questions or concerns. Dr. Grayland Ormond ordered her mammograms this year. Will confirm with his office if he plans to order in 2020.   HPI, Physical Exam, Assessment and Plan have been scribed under the direction and in the presence of Hervey Ard, MD.  Gaspar Cola, CMA    [name] 01/19/2018, 9:06 AM

## 2018-02-05 ENCOUNTER — Other Ambulatory Visit: Payer: Self-pay | Admitting: Family Medicine

## 2018-02-08 DIAGNOSIS — G620 Drug-induced polyneuropathy: Secondary | ICD-10-CM | POA: Diagnosis not present

## 2018-02-08 DIAGNOSIS — T451X5A Adverse effect of antineoplastic and immunosuppressive drugs, initial encounter: Secondary | ICD-10-CM | POA: Diagnosis not present

## 2018-02-10 ENCOUNTER — Encounter: Payer: Self-pay | Admitting: Family Medicine

## 2018-02-10 ENCOUNTER — Ambulatory Visit (INDEPENDENT_AMBULATORY_CARE_PROVIDER_SITE_OTHER): Payer: 59 | Admitting: Family Medicine

## 2018-02-10 ENCOUNTER — Other Ambulatory Visit: Payer: Self-pay

## 2018-02-10 VITALS — BP 100/68 | HR 72 | Temp 98.3°F | Wt 158.8 lb

## 2018-02-10 DIAGNOSIS — M810 Age-related osteoporosis without current pathological fracture: Secondary | ICD-10-CM | POA: Diagnosis not present

## 2018-02-10 DIAGNOSIS — R600 Localized edema: Secondary | ICD-10-CM

## 2018-02-10 DIAGNOSIS — T451X5A Adverse effect of antineoplastic and immunosuppressive drugs, initial encounter: Secondary | ICD-10-CM

## 2018-02-10 DIAGNOSIS — Z13220 Encounter for screening for lipoid disorders: Secondary | ICD-10-CM

## 2018-02-10 DIAGNOSIS — G620 Drug-induced polyneuropathy: Secondary | ICD-10-CM

## 2018-02-10 DIAGNOSIS — R042 Hemoptysis: Secondary | ICD-10-CM

## 2018-02-10 LAB — LIPID PANEL
CHOLESTEROL: 202 mg/dL — AB (ref 0–200)
HDL: 72.3 mg/dL (ref >39.00)
LDL Cholesterol: 113 mg/dL — ABNORMAL HIGH (ref 0–99)
NonHDL: 129.29
TRIGLYCERIDES: 83 mg/dL (ref 0.0–149.0)
Total CHOL/HDL Ratio: 3
VLDL: 16.6 mg/dL (ref 0.0–40.0)

## 2018-02-10 LAB — COMPREHENSIVE METABOLIC PANEL
ALK PHOS: 86 U/L (ref 39–117)
ALT: 13 U/L (ref 0–35)
AST: 22 U/L (ref 0–37)
Albumin: 4.2 g/dL (ref 3.5–5.2)
BUN: 15 mg/dL (ref 6–23)
CO2: 31 meq/L (ref 19–32)
Calcium: 9.9 mg/dL (ref 8.4–10.5)
Chloride: 101 mEq/L (ref 96–112)
Creatinine, Ser: 0.53 mg/dL (ref 0.40–1.20)
GFR: 126.08 mL/min (ref >60.00)
GLUCOSE: 85 mg/dL (ref 70–99)
POTASSIUM: 4 meq/L (ref 3.5–5.1)
Sodium: 139 mEq/L (ref 135–145)
Total Bilirubin: 0.7 mg/dL (ref 0.2–1.2)
Total Protein: 7.5 g/dL (ref 6.0–8.3)

## 2018-02-10 LAB — VITAMIN D 25 HYDROXY (VIT D DEFICIENCY, FRACTURES): VITD: 30.2 ng/mL (ref 30.00–100.00)

## 2018-02-10 LAB — VITAMIN B12: Vitamin B-12: 870 pg/mL (ref 211–911)

## 2018-02-10 NOTE — Assessment & Plan Note (Signed)
This is resolved.  Likely was related to infection.  She has had appropriate follow-up on this.

## 2018-02-10 NOTE — Assessment & Plan Note (Addendum)
Stable.  Continue to follow with neurology.  Check B12 level.

## 2018-02-10 NOTE — Progress Notes (Signed)
  [name], MD Phone: [phone]  Wanda Lopez is a 57 y.o. female who presents today for f/u.  Patient notes she saw pulmonology for her hemoptysis.  She had bronchoscopy which was reassuring.  Should follow-up CT scan with oncology which showed resolution of the findings from prior CT scan.  She has not had any recurrence of cough or hemoptysis.  She follows with neurology for neuropathy.  She is on B12.  They suggested she have her vitamin D checked.  Osteoporosis: She has declined medication.  She currently takes calcium and vitamin D.  She does weightbearing exercise.  Lower extremity edema: Possibly related to her prior chemotherapy.  Notes just in her feet and ankles.  Lasix is beneficial.  She does take potassium with it.  She had a normal echo previously.  Social History   Tobacco Use  Smoking Status Never Smoker  Smokeless Tobacco Never Used     ROS see history of present illness  Objective  Physical Exam Vitals:   02/10/18 0845  BP: 100/68  Pulse: 72  Temp: 98.3 F (36.8 C)  SpO2: 98%    BP Readings from Last 3 Encounters:  02/10/18 100/68  01/19/18 106/64  12/03/17 106/69   Wt Readings from Last 3 Encounters:  02/10/18 158 lb 12.8 oz (72 kg)  01/19/18 158 lb (71.7 kg)  12/03/17 154 lb 3.4 oz (70 kg)    Physical Exam  Constitutional: No distress.  Cardiovascular: Normal rate, regular rhythm and normal heart sounds.  Pulmonary/Chest: Effort normal and breath sounds normal.  Musculoskeletal: She exhibits no edema.  Neurological: She is alert. Gait normal.  Skin: Skin is warm and dry. She is not diaphoretic.     Assessment/Plan: Please see individual problem list.  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  Continue to follow with neurology.  Check B12 level.  Osteoporosis She has declined medication.  She will continue weightbearing exercise.  Continue calcium and vitamin D.  Plan to recheck DEXA scan at time of next  visit.  Hemoptysis This is resolved.  Likely was related to infection.  She has had appropriate follow-up on this.  Bilateral lower extremity edema Suspect venous insufficiency.  Prior workup has been unremarkable.  Lasix is beneficial.  Check lab work.   Health Maintenance: Request sent for colonoscopy and Pap smear results.  Orders Placed This Encounter  Procedures  . Comp Met (CMET)  . Vitamin D (25 hydroxy)  . B12  . Lipid panel    No orders of the defined types were placed in this encounter.    [name], MD [place]

## 2018-02-10 NOTE — Assessment & Plan Note (Signed)
She has declined medication.  She will continue weightbearing exercise.  Continue calcium and vitamin D.  Plan to recheck DEXA scan at time of next visit.

## 2018-02-10 NOTE — Patient Instructions (Signed)
Nice to see you. We will request your colonoscopy records. We will check lab work and contact you with the results. Please continue weightbearing exercise as well as calcium and vitamin D for osteoporosis.

## 2018-02-10 NOTE — Assessment & Plan Note (Signed)
Suspect venous insufficiency.  Prior workup has been unremarkable.  Lasix is beneficial.  Check lab work.

## 2018-03-01 ENCOUNTER — Encounter: Payer: Self-pay | Admitting: Family Medicine

## 2018-05-09 NOTE — Progress Notes (Signed)
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  INTERVAL HISTORY: Patient returns to clinic today for routine six-month evaluation.  She currently feels well and is asymptomatic.  She continues to have a mild peripheral neuropathy that is chronic and unchanged.  She has no other neurologic complaints.  She denies any recent fevers or illnesses.  She denies any chest pain or shortness of breath.  She has no nausea, vomiting, constipation, or diarrhea.  She has no urinary complaints.  Patient feels that her baseline offers no specific complaints today.  REVIEW OF SYSTEMS:   Review of Systems  Constitutional: Negative.  Negative for fever, malaise/fatigue and weight loss.  Respiratory: Negative.  Negative for cough, hemoptysis and shortness of breath.   Cardiovascular: Negative.  Negative for chest pain and leg swelling.  Gastrointestinal: Negative.  Negative for abdominal pain.  Genitourinary: Negative.  Negative for dysuria.  Musculoskeletal: Negative.  Negative for myalgias.  Skin: Negative.  Negative for rash.  Neurological: Positive for sensory change. Negative for focal weakness and weakness.  Psychiatric/Behavioral: Negative.  The patient is not nervous/anxious.    As per HPI. Otherwise, a complete review of systems is negative.   PAST MEDICAL HISTORY: Past Medical History:  Diagnosis Date  . Breast cancer ([place]) 2016   right breast- invasive carcinoma  . 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 Wide excision, sentinel node biopsy.  . Dehydration 05/28/2015  . Fainting spell   . Malignant neoplasm of lower-outer quadrant of right female breast ([place]) 12/17/2015  . Migraines   . Personal history of chemotherapy   . Personal history of radiation therapy     PAST SURGICAL HISTORY: Past Surgical History:  Procedure Laterality Date  . BREAST EXCISIONAL BIOPSY Right 02-21-15   wide excision/sentinel node biopsy.  Marland Kitchen BREAST LUMPECTOMY Right 2016   internal radiation, chemo   . COLONOSCOPY  2013   Dr Vira Agar  . FLEXIBLE BRONCHOSCOPY N/A 08/31/2017   Procedure: FLEXIBLE BRONCHOSCOPY;  Surgeon: Laverle Hobby, MD;  Location: [facility] ORS;  Service: Pulmonary;  Laterality: N/A;  . PORTACATH PLACEMENT  03-29-15   Dr Bary Castilla    FAMILY HISTORY Family History  Problem Relation Age of Onset  . Hypertension Mother   . Heart failure Mother   . Osteoarthritis Mother   . Stroke Mother   . Hypertension Father   . Diabetes Father   . Osteoporosis Sister   . Epilepsy Sister   . Breast cancer Neg Hx     GYNECOLOGIC HISTORY:  No LMP recorded. (Menstrual status: Chemotherapy).     ADVANCED DIRECTIVES:    HEALTH MAINTENANCE: Social History   Tobacco Use  . Smoking status: Never Smoker  . Smokeless tobacco: Never Used  Substance Use Topics  . Alcohol use: No    Alcohol/week: 0.0 oz  . Drug use: No     Colonoscopy:  PAP:  Bone density:  Lipid panel:  No Known Allergies  Current Outpatient Medications  Medication Sig Dispense Refill  .  b complex vitamins tablet Take 2 tablets by mouth daily.    . Biotin 3 MG TABS Take 3 mg by mouth daily.    . Calcium Carb-Cholecalciferol (CALCIUM 600+D) 600-800 MG-UNIT TABS Take 1 tablet by mouth daily.    Marland Kitchen CINNAMON PO Take 1,000 mg by mouth daily.     . furosemide (LASIX) 20 MG tablet TAKE 1 TABLET BY MOUTH DAILY AS NEEDED FOR EDEMA  60 tablet 2  . Omega-3 Fatty Acids (OMEGA 3 500) 500 MG CAPS Take 500 mg by mouth daily.    . potassium chloride SA (KLOR-CON M20) 20 MEQ tablet Take 1 tablet (20 mEq total) 2 (two) times daily by mouth. 180 tablet 1  . Probiotic Product (CVS ADV PROBIOTIC GUMMIES PO) Take 2 tablets by mouth daily.     . vitamin B-12 (CYANOCOBALAMIN) 1000 MCG tablet Take 1,000 mcg by mouth daily.    . vitamin E 1000 UNIT capsule Take 1,000 Units by mouth daily.     No current facility-administered medications for this visit.    Facility-Administered Medications Ordered in Other Visits  Medication Dose Route Frequency Provider Last Rate Last Dose  . sodium chloride 0.9 % injection 10 mL  10 mL Intracatheter PRN Lloyd Huger, MD   10 mL at 06/13/15 1403  . sodium chloride 0.9 % injection 10 mL  10 mL Intravenous PRN Lloyd Huger, MD   10 mL at 10/30/15 1442    OBJECTIVE: BP 104/71 (BP Location: Left Arm, Patient Position: Sitting)   Pulse 73   Temp (!) 96.8 F (36 C) (Tympanic)   Resp 18   Wt 157 lb 1 oz (71.2 kg)   BMI 26.96 kg/m    Body mass index is 26.96 kg/m.    ECOG FS:0 - Asymptomatic  General: Well-developed, well-nourished, no acute distress. Eyes: Pink conjunctiva, anicteric sclera. Breast: Patient declined breast exam today. Lungs: Clear to auscultation bilaterally. Heart: Regular rate and rhythm. No rubs, murmurs, or gallops. Abdomen: Soft, nontender, nondistended. No organomegaly noted, normoactive bowel sounds. Musculoskeletal: No edema, cyanosis, or clubbing. Neuro: Alert, answering all questions appropriately. Cranial nerves grossly intact. Skin: No rashes or petechiae noted. Psych: Normal affect.  LAB RESULTS:  Orders Only on 05/12/2018  Component Date Value Ref Range Status  . CA 27.29 05/12/2018 22.9  0.0 - 38.6 U/mL Final   Comment: (NOTE) Siemens Centaur Immunochemiluminometric Methodology [facility]) Values obtained with different assay methods or kits cannot  be used interchangeably. Results cannot be interpreted as absolute evidence of the presence or absence of malignant disease. Performed At: [facility] [place], [place] [id number] Rush Farmer MD VQ:[id number] Performed at [facility], [address], [place], [place] [address]    Lab Results  Component Value Date   LABCA2 24.7 03/17/2017    STUDIES: No results found.  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  PLAN:   1. 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 Patient is status post wide excision, MammoSite therapy, chemotherapy with Adriamycin and Cytoxan, as well as 12 weeks of Taxol. Patient completed her chemotherapy on September 17, 2015.  Patient's most recent mammogram on December 21, 2017 was reported as BI-RADS 2.  Repeat in January 2020.  Patient gets all her breast exams by her primary care provider.  Her CA-27-29 continues to be within normal limits at 22.9.  Return to clinic in 1 year  for repeat laboratory work and further evaluation. 2. Peripheral neuropathy.  Chronic and unchanged.  Possibly secondary to Taxol chemotherapy. Patient has discontinued Cymbalta and gabapentin. She no longer follows up with neurology.  3. Hemoptysis: Resolved. Follow up CT scan from November 10, 2017 revealed complete resolution of ground glass opacities in the right lung.  Patient previously underwent bronchoscopy which was unrevealing. No further imaging is necessary.  Approximately 20 minutes was spent in discussion of which greater than 50% was consultation.  N.  Patient expressed understanding and was in agreement with this plan. She also understands that She can call clinic at any time with any questions, concerns, or complaints.    Breast cancer [facility]   Staging form: Breast, AJCC 7th Edition     Clinical stage from 03/26/2015: Stage IA  (T1c, N0, M0) - Signed by Lloyd Huger, MD on 03/26/2015   Lloyd Huger, MD   05/16/2018 7:35 AM

## 2018-05-12 ENCOUNTER — Other Ambulatory Visit: Payer: Self-pay

## 2018-05-12 ENCOUNTER — Encounter: Payer: Self-pay | Admitting: Oncology

## 2018-05-12 ENCOUNTER — Inpatient Hospital Stay: Payer: 59

## 2018-05-12 ENCOUNTER — Inpatient Hospital Stay: Payer: 59 | Attending: Oncology | Admitting: Oncology

## 2018-05-12 VITALS — BP 104/71 | HR 73 | Temp 96.8°F | Resp 18 | Wt 157.1 lb

## 2018-05-12 DIAGNOSIS — C50511 Malignant neoplasm of lower-outer quadrant of right female breast: Secondary | ICD-10-CM

## 2018-05-12 DIAGNOSIS — Z79899 Other long term (current) drug therapy: Secondary | ICD-10-CM | POA: Insufficient documentation

## 2018-05-12 DIAGNOSIS — Z923 Personal history of irradiation: Secondary | ICD-10-CM | POA: Diagnosis not present

## 2018-05-12 DIAGNOSIS — Z171 Estrogen receptor negative status [ER-]: Secondary | ICD-10-CM | POA: Insufficient documentation

## 2018-05-12 DIAGNOSIS — Z9221 Personal history of antineoplastic chemotherapy: Secondary | ICD-10-CM | POA: Diagnosis not present

## 2018-05-12 DIAGNOSIS — G629 Polyneuropathy, unspecified: Secondary | ICD-10-CM

## 2018-05-12 NOTE — Progress Notes (Signed)
Pt in for follow up, denies any concerns.  Reports scheduled to see GYN in next week.

## 2018-05-13 LAB — CANCER ANTIGEN 27.29: CA 27.29: 22.9 U/mL (ref 0.0–38.6)

## 2018-05-25 DIAGNOSIS — Z01419 Encounter for gynecological examination (general) (routine) without abnormal findings: Secondary | ICD-10-CM | POA: Diagnosis not present

## 2018-05-25 DIAGNOSIS — Z1211 Encounter for screening for malignant neoplasm of colon: Secondary | ICD-10-CM | POA: Diagnosis not present

## 2018-05-25 DIAGNOSIS — C50511 Malignant neoplasm of lower-outer quadrant of right female breast: Secondary | ICD-10-CM | POA: Diagnosis not present

## 2018-08-15 ENCOUNTER — Other Ambulatory Visit: Payer: Self-pay | Admitting: Family Medicine

## 2018-09-12 DIAGNOSIS — Z23 Encounter for immunization: Secondary | ICD-10-CM | POA: Diagnosis not present

## 2018-10-18 ENCOUNTER — Other Ambulatory Visit: Payer: Self-pay

## 2018-10-18 DIAGNOSIS — C50511 Malignant neoplasm of lower-outer quadrant of right female breast: Secondary | ICD-10-CM

## 2018-10-18 DIAGNOSIS — Z171 Estrogen receptor negative status [ER-]: Principal | ICD-10-CM

## 2018-10-21 ENCOUNTER — Ambulatory Visit (INDEPENDENT_AMBULATORY_CARE_PROVIDER_SITE_OTHER): Payer: 59 | Admitting: General Surgery

## 2018-10-21 ENCOUNTER — Encounter: Payer: Self-pay | Admitting: General Surgery

## 2018-10-21 ENCOUNTER — Other Ambulatory Visit: Payer: Self-pay

## 2018-10-21 VITALS — BP 106/67 | HR 75 | Temp 97.8°F | Resp 14 | Ht 64.0 in | Wt 154.0 lb

## 2018-10-21 DIAGNOSIS — C50511 Malignant neoplasm of lower-outer quadrant of right female breast: Secondary | ICD-10-CM | POA: Diagnosis not present

## 2018-10-21 DIAGNOSIS — Z171 Estrogen receptor negative status [ER-]: Secondary | ICD-10-CM

## 2018-10-21 NOTE — Patient Instructions (Signed)
  Patient recall mammogram has been order for January 2020. Follow up as scheduled. The patient is aware to call back for any questions or concerns.

## 2018-10-21 NOTE — Progress Notes (Signed)
Patient ID: Wanda Lopez, female   DOB: 04-16-1961, 57 y.o.   MRN: [id number]  Chief Complaint  Patient presents with  . Other    HPI Wanda Lopez is a 57 y.o. female here today for a evaluation of a right breast mass. Patient noticed this are about two weeks ago. No pain or discharge. Patient thinks it is scar tissue.  She is scheduled for hewr mammogram in January 2019.  HPI  Past Medical History:  Diagnosis Date  . Breast cancer ([place]) 2016   right breast- invasive carcinoma  . 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 Wide excision, sentinel node biopsy.  . Dehydration 05/28/2015  . Fainting spell   . Malignant neoplasm of lower-outer quadrant of right female breast ([place]) 12/17/2015  . Migraines   . Personal history of chemotherapy   . Personal history of radiation therapy     Past Surgical History:  Procedure Laterality Date  . BREAST EXCISIONAL BIOPSY Right 02-21-15   wide excision/sentinel node biopsy.  Marland Kitchen BREAST LUMPECTOMY Right 2016   internal radiation, chemo   . COLONOSCOPY  2013   Dr Vira Agar  . FLEXIBLE BRONCHOSCOPY N/A 08/31/2017   Procedure: FLEXIBLE BRONCHOSCOPY;  Surgeon: Laverle Hobby, MD;  Location: [facility] ORS;  Service: Pulmonary;  Laterality: N/A;  . PORTACATH PLACEMENT  03-29-15   Dr Bary Castilla    Family History  Problem Relation Age of Onset  . Hypertension Mother   . Heart failure Mother   . Osteoarthritis Mother   . Stroke Mother   . Hypertension Father   . Diabetes Father   . Osteoporosis Sister   . Epilepsy Sister   . Breast cancer Neg Hx     Social History Social History   Tobacco Use  . Smoking status: Never Smoker  . Smokeless tobacco: Never Used  Substance Use Topics  . Alcohol use: No    Alcohol/week: 0.0 standard drinks  . Drug use: No    No Known Allergies  Current Outpatient Medications  Medication Sig Dispense Refill  . b complex  vitamins tablet Take 2 tablets by mouth daily.    . Biotin 3 MG TABS Take 5,000 mg by mouth daily.     . Calcium Carb-Cholecalciferol (CALCIUM 600+D) 600-800 MG-UNIT TABS Take 1 tablet by mouth daily.    Marland Kitchen CINNAMON PO Take 1,000 mg by mouth daily.     . furosemide (LASIX) 20 MG tablet TAKE 1 TABLET BY MOUTH DAILY AS NEEDED FOR EDEMA 30 tablet 5  . Omega-3 Fatty Acids (OMEGA 3 500) 500 MG CAPS Take 500 mg by mouth daily.    . potassium chloride SA (KLOR-CON M20) 20 MEQ tablet Take 1 tablet (20 mEq total) 2 (two) times daily by mouth. 180 tablet 1  . Probiotic Product (CVS ADV PROBIOTIC GUMMIES PO) Take 2 tablets by mouth daily.     . vitamin B-12 (CYANOCOBALAMIN) 1000 MCG tablet Take 1,000 mcg by mouth daily.    . vitamin E 1000 UNIT capsule Take 1,000 Units by mouth daily.     No current facility-administered medications for this visit.    Facility-Administered Medications Ordered in Other Visits  Medication Dose Route Frequency Provider Last Rate Last Dose  . sodium chloride 0.9 % injection 10 mL  10 mL Intracatheter PRN Lloyd Huger, MD   10 mL at 06/13/15 1403  . sodium chloride 0.9 % injection 10 mL  10 mL Intravenous  PRN Lloyd Huger, MD   10 mL at 10/30/15 1442    Review of Systems Review of Systems  Constitutional: Negative.   Respiratory: Negative.   Cardiovascular: Negative.     Blood pressure 106/67, pulse 75, temperature 97.8 F (36.6 C), temperature source Skin, resp. rate 14, height _0  (1.626 m), weight 154 lb (69.9 kg).  Physical Exam Physical Exam  Constitutional: She is oriented to person, place, and time. She appears well-developed and well-nourished.  Eyes: Conjunctivae are normal. No scleral icterus.  Neck: Normal range of motion.  Cardiovascular: Normal rate, regular rhythm and normal heart sounds.  Pulmonary/Chest: Effort normal and breath sounds normal. Right breast exhibits no inverted nipple, no mass, no nipple discharge, no skin change and  no tenderness. Left breast exhibits no inverted nipple, no mass, no nipple discharge, no skin change and no tenderness.    Lymphadenopathy:    She has no cervical adenopathy.  Neurological: She is alert and oriented to person, place, and time.  Skin: Skin is warm and dry.      Assessment    No evidence of recurrent breast cancer.    Plan  Patient recall mammogram has been order for January 2020. Follow up as scheduled. The patient is aware to call back for any questions or concerns.    HPI, Physical Exam, Assessment and Plan have been scribed under the direction and in the presence of Hervey Ard, MD.  Gaspar Cola, CMA  I have completed the exam and reviewed the above documentation for accuracy and completeness.  I agree with the above.  Haematologist has been used and any errors in dictation or transcription are unintentional.  Hervey Ard, M.D., F.A.C.S.  Forest Gleason Damar Petit 10/21/2018, 9:28 PM

## 2018-11-15 ENCOUNTER — Other Ambulatory Visit: Payer: 59

## 2018-11-15 ENCOUNTER — Ambulatory Visit: Payer: 59 | Admitting: Oncology

## 2018-11-29 ENCOUNTER — Other Ambulatory Visit: Payer: Self-pay | Admitting: Family Medicine

## 2018-11-29 NOTE — Telephone Encounter (Signed)
Copied from [place] [phone]. Topic: Quick Communication - Rx Refill/Question >> Nov 29, 2018  4:32 PM Blase Mess A wrote: Medication: KLOR-CON M20 20 MEQ tablet [[id number]]  DISCONTINUED  Has the patient contacted their pharmacy? Yes  (Agent: If no, request that the patient contact the pharmacy for the refill.) (Agent: If yes, when and what did the pharmacy advise?)  Preferred Pharmacy (with phone number or street name): [id number] [address], [place] [phone] (Phone) [phone] (Fax)    Agent: Please be advised that RX refills may take up to 3 business days. We ask that you follow-up with your pharmacy.

## 2018-11-30 MED ORDER — POTASSIUM CHLORIDE CRYS ER 20 MEQ PO TBCR: 20.0000 meq | EXTENDED_RELEASE_TABLET | Freq: Two times a day (BID) | ORAL | 0 refills | Status: DC

## 2018-11-30 NOTE — Telephone Encounter (Signed)
Requested Prescriptions  Pending Prescriptions Disp Refills  . potassium chloride SA (KLOR-CON M20) 20 MEQ tablet 180 tablet 1    Sig: Take 1 tablet (20 mEq total) by mouth 2 (two) times daily.     Endocrinology:  Minerals - Potassium Supplementation Passed - 11/29/2018  7:19 PM      Passed - K in normal range and within 360 days    Potassium  Date Value Ref Range Status  02/10/2018 4.0 3.5 - 5.1 mEq/L Final         Passed - Cr in normal range and within 360 days    Creatinine, Ser  Date Value Ref Range Status  02/10/2018 0.53 0.40 - 1.20 mg/dL Final         Passed - Valid encounter within last 12 months    Recent Outpatient Visits          9 months ago Osteoporosis without current pathological fracture, unspecified osteoporosis type   [place], Angela Adam, MD   1 year ago Neuropathy due to chemotherapeutic drug [facility])   [place] Leone Haven, MD   1 year ago Need for hepatitis C screening test   [facility] Leone Haven, MD   2 years ago Bilateral lower extremity edema   [facility] [place], Angela Adam, MD   2 years ago Malignant neoplasm of female breast, unspecified laterality, unspecified site of breast [facility])   [place] [facility], MD      Future Appointments            In 2 months Caryl Bis, Angela Adam, MD [facility]

## 2018-12-16 ENCOUNTER — Encounter: Payer: Self-pay | Admitting: Radiation Oncology

## 2018-12-16 ENCOUNTER — Ambulatory Visit
Admission: RE | Admit: 2018-12-16 | Discharge: 2018-12-16 | Disposition: A | Discharge status: Home / Self Care | Payer: 59 | Source: Ambulatory Visit | Attending: Radiation Oncology | Admitting: Radiation Oncology

## 2018-12-16 ENCOUNTER — Other Ambulatory Visit: Payer: Self-pay

## 2018-12-16 VITALS — BP 102/70 | HR 75 | Temp 95.6°F | Resp 18 | Wt 155.2 lb

## 2018-12-16 DIAGNOSIS — Z923 Personal history of irradiation: Secondary | ICD-10-CM | POA: Insufficient documentation

## 2018-12-16 DIAGNOSIS — Z853 Personal history of malignant neoplasm of breast: Secondary | ICD-10-CM | POA: Insufficient documentation

## 2018-12-16 DIAGNOSIS — C50511 Malignant neoplasm of lower-outer quadrant of right female breast: Secondary | ICD-10-CM

## 2018-12-16 NOTE — Progress Notes (Signed)
Radiation Oncology Follow up Note  Name: Wanda Lopez   Date:   12/16/2018 MRN:  [id number] DOB: 1961/08/22    This 58 y.o. 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 Seen today in routine follow-up she is doing well.she is not on antiestrogen therapy based on the triple negative nature of her disease.she has a mammogram scheduled for next week.ast mammogram 1 year prior was BI-RADS 2 benign. She specifically denies breast tenderness cough or bone pain.  COMPLICATIONS OF TREATMENT: none  FOLLOW UP COMPLIANCE: keeps appointments   PHYSICAL EXAM:  BP 102/70 (BP Location: Left Arm, Patient Position: Sitting)   Pulse 75   Temp (!) 95.6 F (35.3 C) (Tympanic)   Resp 18   Wt 155 lb 3.3 oz (70.4 kg)   BMI 26.64 kg/m  Lungs are clear to A&P cardiac examination essentially unremarkable with regular rate and rhythm. No dominant mass or nodularity is noted in either breast in 2 positions examined. Incision is well-healed. No axillary or supraclavicular adenopathy is appreciated. Cosmetic result is excellent.Well-developed well-nourished patient in NAD. HEENT reveals PERLA, EOMI, discs not visualized.  Oral cavity is clear. No oral mucosal lesions are identified. Neck is clear without evidence of cervical or supraclavicular adenopathy. Lungs are clear to A&P. Cardiac examination is essentially unremarkable with regular rate and rhythm without murmur rub or thrill. Abdomen is benign with no organomegaly or masses noted. Motor sensory and DTR levels are equal and symmetric in the upper and lower extremities. Cranial nerves II through XII are  grossly intact. Proprioception is intact. No peripheral adenopathy or edema is identified. No motor or sensory levels are noted. Crude visual fields are within normal range.  RADIOLOGY RESULTS: mammogram from 1 year prior is reviewed will review her mammograms next week when available  PLAN: present time patient continues to do well with no evidence of disease. I'm please were overall progress. I've asked to see her back in 1 year for follow-up and then will discontinue follow-up care since will be close to 5 years. Patient Has my treatment plan well. She knows to call with any concerns.  I would like to take this opportunity to thank you for allowing me to participate in the care of your patient.Noreene Filbert, MD

## 2018-12-16 NOTE — Addendum Note (Signed)
Encounter addended by: Noreene Filbert, MD on: 12/16/2018 11:33 AM  Actions taken: LOS modified

## 2018-12-22 ENCOUNTER — Ambulatory Visit
Admission: RE | Admit: 2018-12-22 | Discharge: 2018-12-22 | Disposition: A | Discharge status: Home / Self Care | Payer: 59 | Source: Ambulatory Visit | Attending: General Surgery | Admitting: General Surgery

## 2018-12-22 DIAGNOSIS — C50511 Malignant neoplasm of lower-outer quadrant of right female breast: Secondary | ICD-10-CM

## 2018-12-22 DIAGNOSIS — Z171 Estrogen receptor negative status [ER-]: Principal | ICD-10-CM

## 2018-12-22 DIAGNOSIS — R922 Inconclusive mammogram: Secondary | ICD-10-CM | POA: Diagnosis not present

## 2019-01-04 ENCOUNTER — Encounter: Payer: Self-pay | Admitting: General Surgery

## 2019-01-04 ENCOUNTER — Ambulatory Visit: Payer: 59 | Admitting: General Surgery

## 2019-01-04 ENCOUNTER — Ambulatory Visit (INDEPENDENT_AMBULATORY_CARE_PROVIDER_SITE_OTHER): Payer: 59 | Admitting: General Surgery

## 2019-01-04 ENCOUNTER — Other Ambulatory Visit: Payer: Self-pay

## 2019-01-04 VITALS — BP 107/74 | HR 72 | Temp 97.6°F | Ht 65.0 in | Wt 157.0 lb

## 2019-01-04 DIAGNOSIS — C50511 Malignant neoplasm of lower-outer quadrant of right female breast: Secondary | ICD-10-CM | POA: Diagnosis not present

## 2019-01-04 DIAGNOSIS — Z171 Estrogen receptor negative status [ER-]: Secondary | ICD-10-CM

## 2019-01-04 NOTE — Progress Notes (Signed)
Patient ID: Wanda Lopez, female   DOB: 1961/01/02, 58 y.o.   MRN: [id number]  Chief Complaint  Patient presents with  . Follow-up    mammogram     HPI Wanda Lopez is a 58 y.o. female who presents for a breast evaluation. The most recent mammogram was done on 12/22/2018.  Area of concern in the right breast at the time of her November 2019 exam has significantly improved. Patient does perform regular self breast checks and gets regular mammograms done.    HPI  Past Medical History:  Diagnosis Date  . Breast cancer of lower-outer quadrant of right female breast ([place]) 02/09/2015   1.7 triple negative, medullary carcinoma. Wide excision, sentinel node biopsy.  . Dehydration 05/28/2015  . Fainting spell   . Malignant neoplasm of lower-outer quadrant of right female breast ([place]) 12/17/2015  . Migraines   . Personal history of chemotherapy   . Personal history of radiation therapy     Past Surgical History:  Procedure Laterality Date  . BREAST EXCISIONAL BIOPSY Right 02-21-15   wide excision/sentinel node biopsy.  Marland Kitchen BREAST LUMPECTOMY Right 2016   internal radiation, chemo   . COLONOSCOPY  2013   Dr Vira Agar  . FLEXIBLE BRONCHOSCOPY N/A 08/31/2017   Procedure: FLEXIBLE BRONCHOSCOPY;  Surgeon: Laverle Hobby, MD;  Location: [facility] ORS;  Service: Pulmonary;  Laterality: N/A;  . PORTACATH PLACEMENT  03-29-15   Dr Bary Castilla    Family History  Problem Relation Age of Onset  . Hypertension Mother   . Heart failure Mother   . Osteoarthritis Mother   . Stroke Mother   . Hypertension Father   . Diabetes Father   . Osteoporosis Sister   . Epilepsy Sister   . Breast cancer Neg Hx     Social History Social History   Tobacco Use  . Smoking status: Never Smoker  . Smokeless tobacco: Never Used  Substance Use Topics  . Alcohol use: No    Alcohol/week: 0.0 standard drinks  . Drug use: No    No Known Allergies  Current Outpatient Medications  Medication Sig Dispense  Refill  . b complex vitamins tablet Take 2 tablets by mouth daily.    . Biotin 3 MG TABS Take 5,000 mg by mouth daily.     . Calcium Carb-Cholecalciferol (CALCIUM 600+D) 600-800 MG-UNIT TABS Take 1 tablet by mouth daily.    Marland Kitchen CINNAMON PO Take 1,000 mg by mouth daily.     . furosemide (LASIX) 20 MG tablet TAKE 1 TABLET BY MOUTH DAILY AS NEEDED FOR EDEMA 30 tablet 5  . Omega-3 Fatty Acids (OMEGA 3 500) 500 MG CAPS Take 1,000 mg by mouth daily.     . potassium chloride SA (KLOR-CON M20) 20 MEQ tablet Take 1 tablet (20 mEq total) by mouth 2 (two) times daily. 180 tablet 0  . Probiotic Product ([employer] ADV PROBIOTIC GUMMIES PO) Take 2 tablets by mouth daily.     . vitamin B-12 (CYANOCOBALAMIN) 1000 MCG tablet Take 1,000 mcg by mouth daily.    . vitamin E 1000 UNIT capsule Take 1,000 Units by mouth daily.     No current facility-administered medications for this visit.    Facility-Administered Medications Ordered in Other Visits  Medication Dose Route Frequency Provider Last Rate Last Dose  . sodium chloride 0.9 % injection 10 mL  10 mL Intracatheter PRN Lloyd Huger, MD   10 mL at 06/13/15 1403  . sodium chloride 0.9 % injection 10 mL  10 mL Intravenous PRN Lloyd Huger, MD   10 mL at 10/30/15 1442    Review of Systems Review of Systems  Constitutional: Negative.   Respiratory: Negative.   Cardiovascular: Negative.     Blood pressure 107/74, pulse 72, temperature 97.6 F (36.4 C), temperature source Skin, height _0  (1.651 m), weight 157 lb (71.2 kg), SpO2 98 %.  Physical Exam Physical Exam Exam conducted with a chaperone present.  Constitutional:      Appearance: She is well-developed.  Eyes:     General: No scleral icterus.    Conjunctiva/sclera: Conjunctivae normal.  Neck:     Musculoskeletal: Neck supple.  Cardiovascular:     Rate and Rhythm: Normal rate and regular rhythm.     Heart sounds: Normal heart sounds.  Pulmonary:     Effort: Pulmonary effort is  normal.     Breath sounds: Normal breath sounds.  Chest:     Breasts:        Right: No inverted nipple, mass, nipple discharge, skin change or tenderness.        Left: No inverted nipple, mass, nipple discharge, skin change or tenderness.    Lymphadenopathy:     Cervical: No cervical adenopathy.  Skin:    General: Skin is warm and dry.  Neurological:     Mental Status: She is alert and oriented to person, place, and time.     Data Reviewed Bilateral diagnostic mammograms dated December 22, 2018 were reviewed.  BI-RADS-2.  Assessment    No evidence of recurrent breast cancer.    Plan  The patient has been asked to return to the office in one year with a bilateral diagnostic mammogram.The patient is aware to call back for any questions or concerns.   HPI, Physical Exam, Assessment and Plan have been scribed under the direction and in the presence of Hervey Ard, MD.  Gaspar Cola, CMA  I have completed the exam and reviewed the above documentation for accuracy and completeness.  I agree with the above.  Haematologist has been used and any errors in dictation or transcription are unintentional.  Hervey Ard, M.D., F.A.C.S.   Wanda Lopez 01/05/2019, 3:32 PM

## 2019-01-04 NOTE — Patient Instructions (Addendum)
The patient has been asked to return to the office in one year with a bilateral diagnostic mammogram.The patient is aware to call back for any questions or concerns. 

## 2019-01-05 ENCOUNTER — Encounter: Payer: Self-pay | Admitting: General Surgery

## 2019-02-11 ENCOUNTER — Encounter: Payer: 59 | Admitting: Family Medicine

## 2019-02-11 IMAGING — CR DG CHEST 2V
2 series · 2 of 2 positions shown · non-contrast
Comparison: Portable chest radiograph 03/29/2015.

CLINICAL DATA: 56-year-old female coughing up bloody sputum today.
History of breast cancer.

EXAM:
CHEST  2 VIEW

[chest pa]
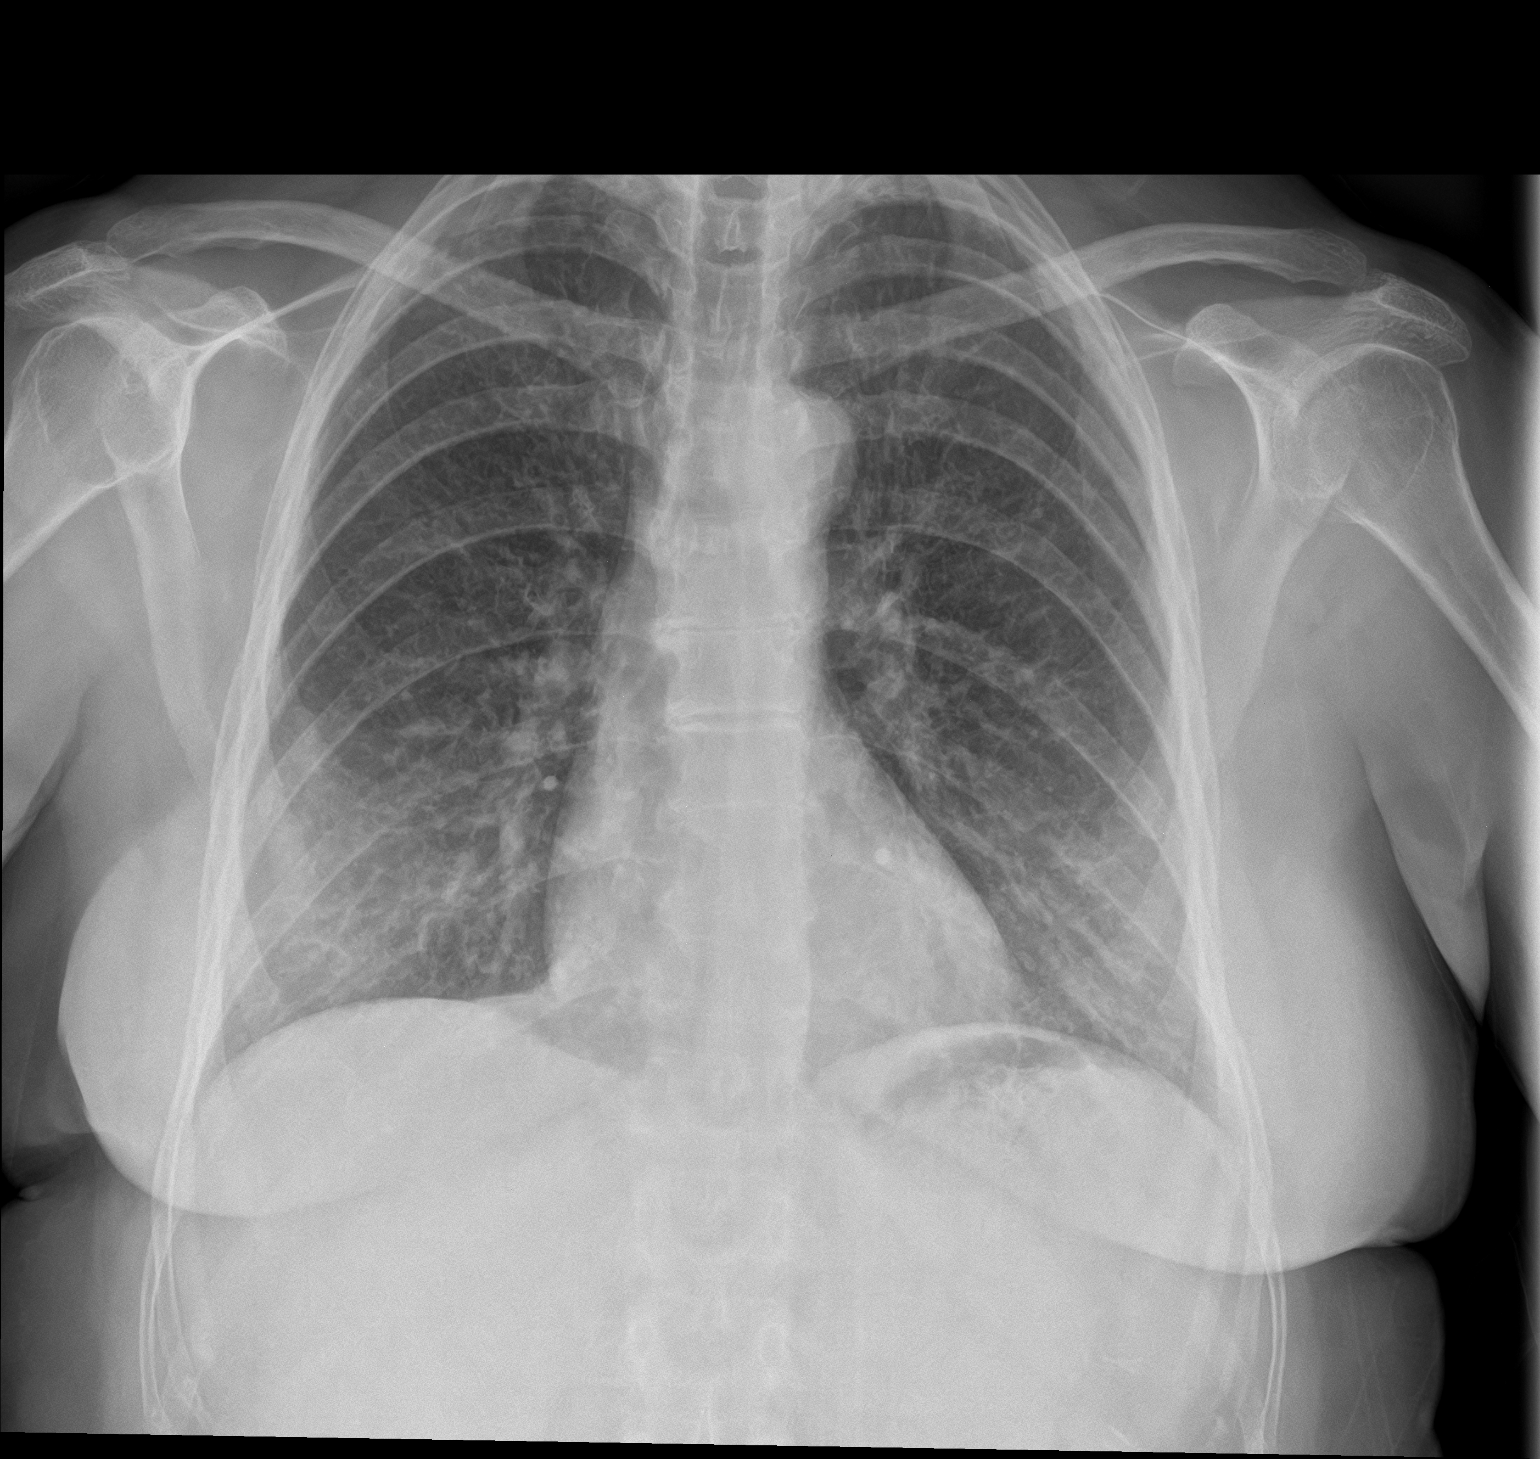

[chest lat]
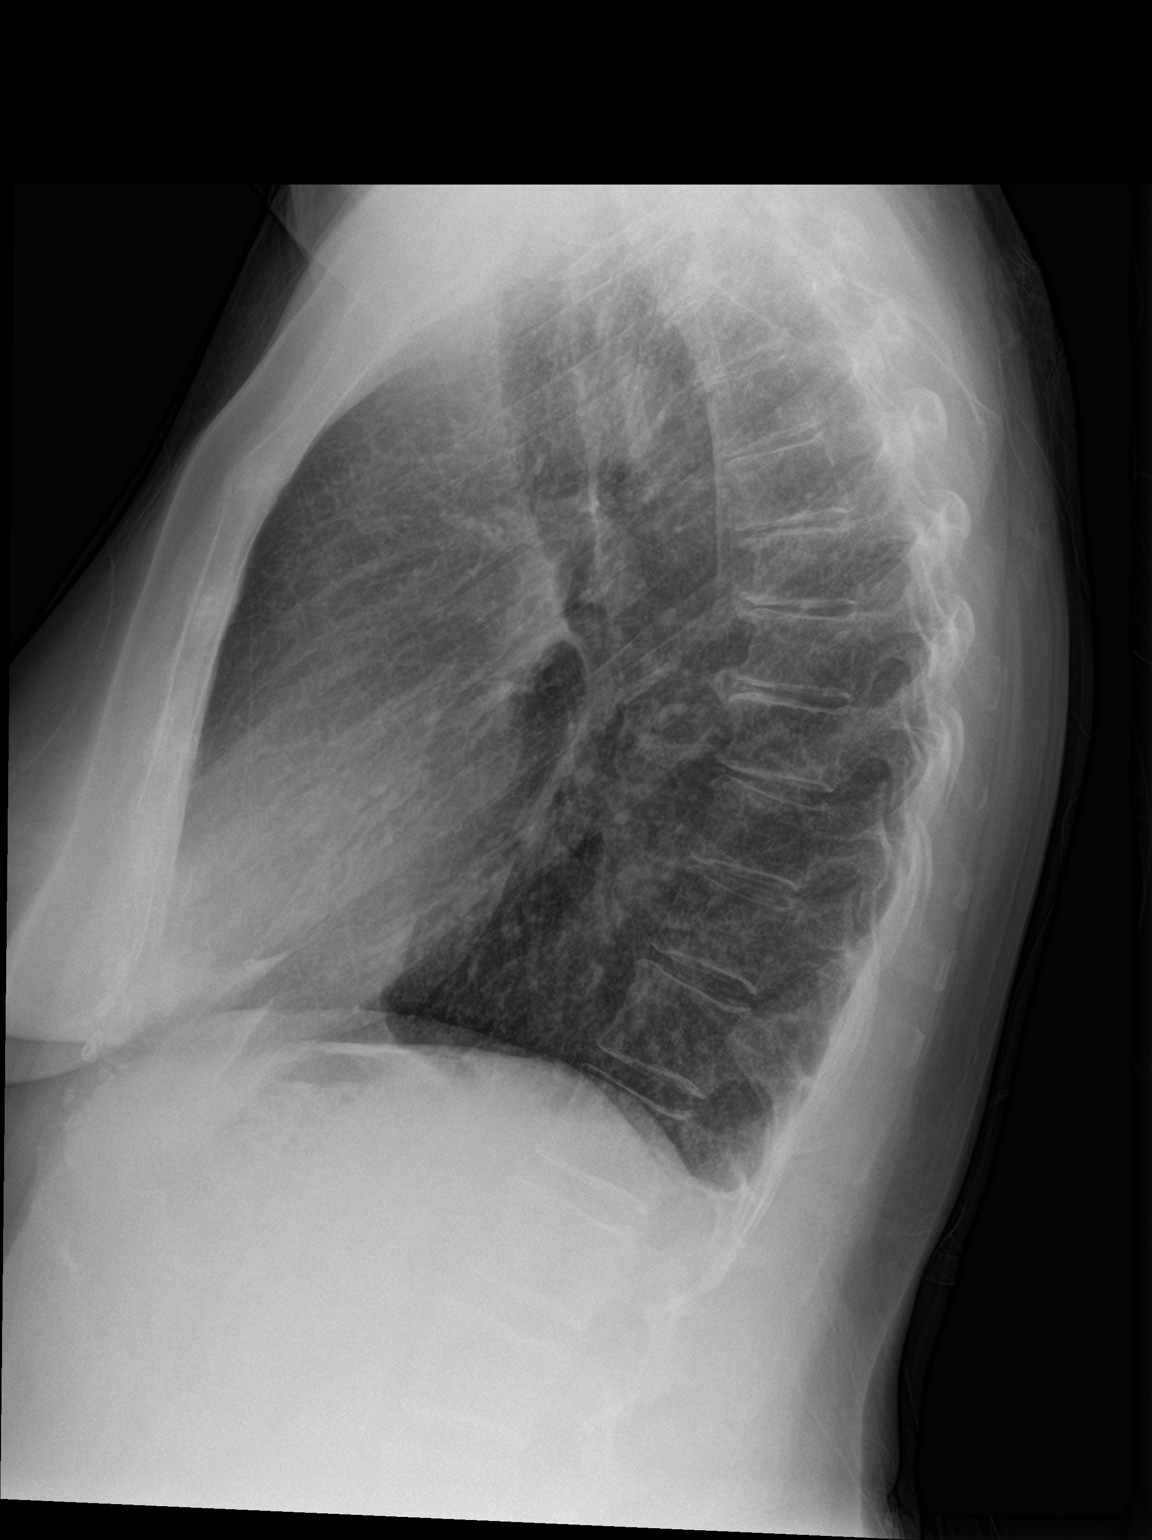

[2 of 2 positions shown; findings below may reference images not displayed]

FINDINGS: Lung volumes are at the upper limits of normal to mildly increased.
No pneumothorax. Chronic appearing increased pulmonary interstitial
opacity with basilar predominance. There is mild blunting of the
posterior costophrenic angles on the lateral view, but no other
confluent pulmonary opacity. No pleural fluid identified in the
fissures. Normal cardiac size and mediastinal contours. Visualized
tracheal air column is within normal limits. No acute osseous
abnormality identified. Negative visible bowel gas pattern.
IMPRESSION: 1. Trace pleural effusions versus pleural scarring.
2. No other acute cardiopulmonary abnormality; chronic appearing
bilateral pulmonary interstitial opacity.

## 2019-02-16 DIAGNOSIS — T451X5A Adverse effect of antineoplastic and immunosuppressive drugs, initial encounter: Secondary | ICD-10-CM | POA: Diagnosis not present

## 2019-02-16 DIAGNOSIS — G620 Drug-induced polyneuropathy: Secondary | ICD-10-CM | POA: Diagnosis not present

## 2019-02-21 ENCOUNTER — Telehealth: Payer: Self-pay | Admitting: Family Medicine

## 2019-02-21 MED ORDER — FUROSEMIDE 20 MG PO TABS: ORAL_TABLET | ORAL | 1 refills | Status: DC

## 2019-02-21 NOTE — Telephone Encounter (Signed)
Last OV 02/10/2018   Last refilled 08/16/2018 disp 30 with 5 refills  Rx refilled pt will need an Ov for more refills

## 2019-02-21 NOTE — Telephone Encounter (Signed)
Copied from [place] [id number]. Topic: Quick Communication - Rx Refill/Question >> Feb 21, 2019 10:20 AM Leward Quan A wrote: Medication: furosemide (LASIX) 20 MG tablet  Has the patient contacted their pharmacy? Yes.   (Agent: If no, request that the patient contact the pharmacy for the refill.) (Agent: If yes, when and what did the pharmacy advise?)  Preferred Pharmacy (with phone number or street name): [id number] [address], [place] [phone] (Phone) [phone] (Fax)    Agent: Please be advised that RX refills may take up to 3 business days. We ask that you follow-up with your pharmacy.

## 2019-02-21 NOTE — Telephone Encounter (Signed)
Called pt and left a detailed VM advised pt to call back to schedule appt.

## 2019-02-23 ENCOUNTER — Other Ambulatory Visit: Payer: Self-pay | Admitting: Family Medicine

## 2019-03-01 ENCOUNTER — Encounter: Payer: 59 | Admitting: Family Medicine

## 2019-04-04 ENCOUNTER — Telehealth: Payer: Self-pay | Admitting: Family Medicine

## 2019-04-04 DIAGNOSIS — R600 Localized edema: Secondary | ICD-10-CM

## 2019-04-04 NOTE — Telephone Encounter (Signed)
Copied from [place] [id number]. Topic: Quick Communication - See Telephone Encounter >> Apr 04, 2019 11:53 AM Blase Mess A wrote: CRM for notification. See Telephone encounter for: 04/04/19.    Patient is wanting to know if she is in need of a medication refill appt and lab work. Please advise CB- [phone] (M) Leave vm if do not answer. Thank you

## 2019-04-04 NOTE — Telephone Encounter (Signed)
Pt needs lasix refilled last appt was 1 year ago. Next appt scheduled in July 2020.   Called and spoke with pt she stated that she had two appt scheduled in the past but both were canceled due to [place] being out of town and around the time the COVID_19 started.   Pt would be willing to do a virtual visit but is worried if insurance will no cover it. Pt would be happy to do lab work since she needs her lasix refill and pt is aware that this medication can affect the kidneys.   Pt already has an appt scheduled in July if possible she would like to do lab work get her lasix refilled because she will be out of it this weekend and keep the [place] in July appt.   Sent to PCP to advise

## 2019-04-04 NOTE — Telephone Encounter (Signed)
Order placed to check lab work. Please get her scheduled for this week. We can plan on keeping her appointment in July.

## 2019-04-04 NOTE — Telephone Encounter (Signed)
Duplicated message.

## 2019-04-04 NOTE — Telephone Encounter (Signed)
Copied from [place] [id number]. Topic: Quick Communication - Rx Refill/Question >> Apr 04, 2019 11:50 AM Blase Mess A wrote: Medication: furosemide (LASIX) 20 MG tablet [[id number]] Patient is now taking 2 day She is requesting more than 30 pills  Has the patient contacted their pharmacy? Yes  (Agent: If no, request that the patient contact the pharmacy for the refill.) (Agent: If yes, when and what did the pharmacy advise?)  Preferred Pharmacy (with phone number or street name):  [id number] [address], [place] [phone] (Phone) [phone] (Fax)     Agent: Please be advised that RX refills may take up to 3 business days. We ask that you follow-up with your pharmacy.

## 2019-04-04 NOTE — Telephone Encounter (Signed)
Pt does need an OV for more refills

## 2019-04-05 ENCOUNTER — Other Ambulatory Visit: Payer: Self-pay

## 2019-04-05 MED ORDER — FUROSEMIDE 20 MG PO TABS: ORAL_TABLET | ORAL | 0 refills | Status: DC

## 2019-04-05 NOTE — Telephone Encounter (Signed)
Pt scheduled for lab work May 14,2020 @ 8:15

## 2019-04-05 NOTE — Telephone Encounter (Signed)
Pt advised and voiced understanding.   

## 2019-04-14 ENCOUNTER — Telehealth: Payer: Self-pay | Admitting: *Deleted

## 2019-04-14 ENCOUNTER — Other Ambulatory Visit (INDEPENDENT_AMBULATORY_CARE_PROVIDER_SITE_OTHER): Payer: 59

## 2019-04-14 ENCOUNTER — Other Ambulatory Visit: Payer: Self-pay

## 2019-04-14 DIAGNOSIS — Z13220 Encounter for screening for lipoid disorders: Secondary | ICD-10-CM

## 2019-04-14 DIAGNOSIS — R600 Localized edema: Secondary | ICD-10-CM

## 2019-04-14 DIAGNOSIS — M810 Age-related osteoporosis without current pathological fracture: Secondary | ICD-10-CM

## 2019-04-14 LAB — BASIC METABOLIC PANEL
BUN: 17 mg/dL (ref 6–23)
CO2: 30 mEq/L (ref 19–32)
Calcium: 9.4 mg/dL (ref 8.4–10.5)
Chloride: 99 mEq/L (ref 96–112)
Creatinine, Ser: 0.72 mg/dL (ref 0.40–1.20)
GFR: 82.96 mL/min (ref >60.00)
Glucose, Bld: 86 mg/dL (ref 70–99)
Potassium: 4.6 mEq/L (ref 3.5–5.1)
Sodium: 139 mEq/L (ref 135–145)

## 2019-04-14 LAB — LIPID PANEL
Cholesterol: 224 mg/dL — ABNORMAL HIGH (ref 0–200)
HDL: 69.2 mg/dL (ref >39.00)
LDL Cholesterol: 137 mg/dL — ABNORMAL HIGH (ref 0–99)
NonHDL: 154.32
Total CHOL/HDL Ratio: 3
Triglycerides: 87 mg/dL (ref 0.0–149.0)
VLDL: 17.4 mg/dL (ref 0.0–40.0)

## 2019-04-14 LAB — VITAMIN D 25 HYDROXY (VIT D DEFICIENCY, FRACTURES): VITD: 29.89 ng/mL — ABNORMAL LOW (ref 30.00–100.00)

## 2019-04-14 NOTE — Addendum Note (Signed)
Addended by: Leeanne Rio on: 04/14/2019 08:49 AM   Modules accepted: Orders

## 2019-04-14 NOTE — Telephone Encounter (Signed)
I have placed additional orders for screening labs. The patient requested a refill of lasix and that was the reason for only ordering the BMET initially.

## 2019-04-14 NOTE — Telephone Encounter (Signed)
Pt came in for labs this morning & felt that there should have been more labs ordered than just the BMP. I drew one additional tube. If more labs are needed, please place future lab orders.

## 2019-04-18 ENCOUNTER — Other Ambulatory Visit: Payer: Self-pay

## 2019-04-18 ENCOUNTER — Encounter: Payer: Self-pay | Admitting: Family Medicine

## 2019-04-18 ENCOUNTER — Ambulatory Visit (INDEPENDENT_AMBULATORY_CARE_PROVIDER_SITE_OTHER): Payer: 59 | Admitting: Family Medicine

## 2019-04-18 ENCOUNTER — Telehealth: Payer: Self-pay | Admitting: Family Medicine

## 2019-04-18 DIAGNOSIS — E7800 Pure hypercholesterolemia, unspecified: Secondary | ICD-10-CM | POA: Diagnosis not present

## 2019-04-18 DIAGNOSIS — R600 Localized edema: Secondary | ICD-10-CM

## 2019-04-18 DIAGNOSIS — M810 Age-related osteoporosis without current pathological fracture: Secondary | ICD-10-CM | POA: Diagnosis not present

## 2019-04-18 MED ORDER — FUROSEMIDE 20 MG PO TABS: ORAL_TABLET | ORAL | 1 refills | Status: DC

## 2019-04-18 MED ORDER — POTASSIUM CHLORIDE CRYS ER 20 MEQ PO TBCR: 20.0000 meq | EXTENDED_RELEASE_TABLET | Freq: Two times a day (BID) | ORAL | 1 refills | Status: DC

## 2019-04-18 NOTE — Assessment & Plan Note (Signed)
Dexa scan ordered.  Patient will start on vitamin D 1000 international units daily over-the-counter.

## 2019-04-18 NOTE — Assessment & Plan Note (Signed)
I suspect this is related to venous insufficiency.  Prior work-up is unremarkable.  Discussed elevation of legs and offered referral to vascular surgery to help confirm the diagnosis and discuss treatment options.  Patient declined this.  We will continue Lasix.  Refill sent to pharmacy.

## 2019-04-18 NOTE — Telephone Encounter (Signed)
Please contact the patient and get her scheduled for labs in 3 months and follow-up in the office in 6 months.  Thanks.

## 2019-04-18 NOTE — Progress Notes (Signed)
Virtual Visit via video Note  This visit type was conducted due to national recommendations for restrictions regarding the COVID-19 pandemic (e.g. social distancing).  This format is felt to be most appropriate for this patient at this time.  All issues noted in this document were discussed and addressed.  No physical exam was performed (except for noted visual exam findings with Video Visits).   I connected with Wanda Lopez today at 11:30 AM EDT by a video enabled telemedicine application and verified that I am speaking with the correct person using two identifiers. Location patient: car, not driving Location provider: work Persons participating in the virtual visit: patient, provider  I discussed the limitations, risks, security and privacy concerns of performing an evaluation and management service by telephone and the availability of in person appointments. I also discussed with the patient that there may be a patient responsible charge related to this service. The patient expressed understanding and agreed to proceed.  Reason for visit: follow-up  HPI: Lower extremity edema: Patient continues to have some issues with this.  Mostly in her feet and ankles right slightly greater than left.  No orthopnea, PND, or shortness of breath.  She does not elevate her legs.  She does note the furosemide helps.  She had a prior echo that did not reveal CHF.  Prior lab work unremarkable for cause.  Osteoporosis: Patient is due for a bone density scan.  She is not taking any vitamin D.  Her recent vitamin D was minimally low.  Elevated LDL: Patient was exercising by doing exercise classes and going to the gym though that has fallen off recently.  She occasionally does a treadmill.  Her diet is relatively healthy.  She does eat lots of salads.  She eats chicken.  She eats pizza.  She does drink some Diet Coke and does drink some sweet tea oftentimes 2 glasses/day.  Not many snack foods.     ROS: See  pertinent positives and negatives per HPI.  Past Medical History:  Diagnosis Date  . 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 Wide excision, sentinel node biopsy.  . Dehydration 05/28/2015  . Fainting spell   . Malignant neoplasm of lower-outer quadrant of right female breast ([place]) 12/17/2015  . Migraines   . Personal history of chemotherapy   . Personal history of radiation therapy     Past Surgical History:  Procedure Laterality Date  . BREAST EXCISIONAL BIOPSY Right 02-21-15   wide excision/sentinel node biopsy.  Marland Kitchen BREAST LUMPECTOMY Right 2016   internal radiation, chemo   . COLONOSCOPY  2013   Dr Vira Agar  . FLEXIBLE BRONCHOSCOPY N/A 08/31/2017   Procedure: FLEXIBLE BRONCHOSCOPY;  Surgeon: Laverle Hobby, MD;  Location: [facility] ORS;  Service: Pulmonary;  Laterality: N/A;  . PORTACATH PLACEMENT  03-29-15   Dr Bary Castilla    Family History  Problem Relation Age of Onset  . Hypertension Mother   . Heart failure Mother   . Osteoarthritis Mother   . Stroke Mother   . Hypertension Father   . Diabetes Father   . Osteoporosis Sister   . Epilepsy Sister   . Breast cancer Neg Hx     SOCIAL HX: nonsmoker   Current Outpatient Medications:  .  b complex vitamins tablet, Take 2 tablets by mouth daily., Disp: , Rfl:  .  Biotin 3 MG TABS, Take 5,000 mg by mouth daily. , Disp: , Rfl:  .  Calcium Carb-Cholecalciferol (CALCIUM 600+D) 600-800 MG-UNIT TABS, Take 1 tablet by mouth daily., Disp: , Rfl:  .  CINNAMON PO, Take 1,000 mg by mouth daily. , Disp: , Rfl:  .  furosemide (LASIX) 20 MG tablet, TAKE 1 TABLET BY MOUTH DAILY AS NEEDED FOR EDEMA, Disp: 90 tablet, Rfl: 1 .  potassium chloride SA (KLOR-CON M20) 20 MEQ tablet, Take 1 tablet (20 mEq total) by mouth 2 (two) times daily., Disp: 180 tablet, Rfl: 1 .  Probiotic Product ([employer]S ADV PROBIOTIC GUMMIES PO), Take 2 tablets by mouth daily. , Disp: , Rfl:  .   vitamin B-12 (CYANOCOBALAMIN) 1000 MCG tablet, Take 1,000 mcg by mouth daily., Disp: , Rfl:  .  vitamin E 1000 UNIT capsule, Take 1,000 Units by mouth daily., Disp: , Rfl:  .  ELDERBERRY PO, Take by mouth., Disp: , Rfl:  .  Omega-3 Fatty Acids (OMEGA 3 500) 500 MG CAPS, Take 1,000 mg by mouth daily. , Disp: , Rfl:  No current facility-administered medications for this visit.   Facility-Administered Medications Ordered in Other Visits:  .  sodium chloride 0.9 % injection 10 mL, 10 mL, Intracatheter, PRN, Finnegan, Timothy J, MD, 10 mL at 06/13/15 1403 .  sodium chloride 0.9 % injection 10 mL, 10 mL, Intravenous, PRN, Finnegan, Timothy J, MD, 10 mL at 10/30/15 1442  EXAM:  VITALS per patient if applicable: none  GENERAL: alert, oriented, appears well and in no acute distress  HEENT: atraumatic, conjunttiva clear, no obvious abnormalities on inspection of external nose and ears  NECK: normal movements of the head and neck  LUNGS: on inspection no signs of respiratory distress, breathing rate appears normal, no obvious gross SOB, gasping or wheezing  CV: no obvious cyanosis  MS: moves all visible extremities without noticeable abnormality  PSYCH/NEURO: pleasant and cooperative, no obvious depression or anxiety, speech and thought processing grossly intact  ASSESSMENT AND PLAN:  Discussed the following assessment and plan:  Osteoporosis without current pathological fracture, unspecified osteoporosis type - Plan: DG Bone Density, Vitamin D (25 hydroxy)  Bilateral lower extremity edema  Elevated LDL cholesterol level - Plan: LDL cholesterol, direct, Comp Met (CMET)  Osteoporosis Dexa scan ordered.  Patient will start on vitamin D 1000 international units daily over-the-counter.  Bilateral lower extremity edema I suspect this is related to venous insufficiency.  Prior work-up is unremarkable.  Discussed elevation of legs and offered referral to vascular surgery to help confirm the  diagnosis and discuss treatment options.  Patient declined this.  We will continue Lasix.  Refill sent to pharmacy.  Elevated LDL cholesterol level Does not meet criteria for treatment based on ASCVD risk score.  Discussed diet and exercise.  Plan to recheck in 3 months.  CMA will contact patient and get her set up for lab work in 3 months and follow-up in 6 months.  Social distancing precautions and sick precautions given regarding COVID-19   I discussed the assessment and treatment plan with the patient. The patient was provided an opportunity to ask questions and all were answered. The patient agreed with the plan and demonstrated an understanding of the instructions.   The patient was advised to call back or seek an in-person evaluation if the symptoms worsen or if the condition fails to improve as anticipated.  The 10-year ASCVD risk score (Goff DC Jr., et al., 2013) is: 1.7%   Values used to calculate the score:     Age: 57 years     Sex: Female       Is Non-Hispanic African American: No     Diabetic: No     Tobacco smoker: No     Systolic Blood Pressure: 408 mmHg     Is BP treated: No     HDL Cholesterol: 69.2 mg/dL     Total Cholesterol: 224 mg/dL    [name], MD

## 2019-04-18 NOTE — Telephone Encounter (Signed)
Called pt and left a VM to call me back to schedule for an appt.

## 2019-04-18 NOTE — Assessment & Plan Note (Signed)
Does not meet criteria for treatment based on ASCVD risk score.  Discussed diet and exercise.  Plan to recheck in 3 months.

## 2019-04-19 NOTE — Telephone Encounter (Signed)
Called and spoke with pt. Pt scheduled. Pt has an appt in July for her CPE will she need the 6 month follow up in November as well?  Plus pt wanted to know what she needs to do about having her BD screening done since she is due?  Sent to PCP

## 2019-04-19 NOTE — Telephone Encounter (Signed)
I do not believe she needs to do anything regarding her bone density scan though I will forward to [facility] to see if she can get this scheduled or if the patient needs to schedule it.  She can keep her CPE in July and then we can determine follow-up at that point.  Thanks.

## 2019-04-20 NOTE — Telephone Encounter (Signed)
Called and spoke with pt. Pt advised appt in November canceled

## 2019-04-20 NOTE — Telephone Encounter (Signed)
Please let the patient know that she should receive a letter with the information regarding how to schedule her bone density scan.  If she has not received this in the next several weeks she should contact us.  Thanks.

## 2019-04-20 NOTE — Telephone Encounter (Signed)
Called pt and left a VM to call me back

## 2019-04-20 NOTE — Telephone Encounter (Signed)
She will need to call and schedule this appointment. I send letters to everyone to let them know and the phone number to call to get it scheduled. I haven't gotten to 5/18 yet. I just finished 5/10. Thanks, [employer]

## 2019-04-21 ENCOUNTER — Encounter: Payer: Self-pay | Admitting: Family Medicine

## 2019-04-21 NOTE — Telephone Encounter (Signed)
Called pt and left a detailed VM advised to call back if needed. Left on pt's cell phone.

## 2019-04-26 ENCOUNTER — Telehealth: Payer: Self-pay | Admitting: Family Medicine

## 2019-04-26 NOTE — Telephone Encounter (Signed)
Copied from [place] [id number]. Topic: Quick Communication - See Telephone Encounter >> Apr 26, 2019 11:52 AM Rutherford Nail, NT wrote: CRM for notification. See Telephone encounter for: 04/26/19. Patient calling and states that she is now taking furosemide (LASIX) 20 MG tablet - 2 tablets a day, instead of 1. Would like ot know if the new prescription that was sent over to the pharmacy could be changed to show this information so they will give her the correct quantity?  Also, would like a call back regarding her Vitamin D. States that she was told to get Vitamin D, which she did, but is also taking Calcium 600mg  + D3 800 IU. Would like ot know if this is going to be an issue and have her taking too much Vitamin D since it is in her calcium pill and a regular Vitamin D pill? Please advise.  CB#: [phone] (work until [employer])

## 2019-04-26 NOTE — Telephone Encounter (Signed)
Please find out when she started taking this higher dosage.  We just did her visit and I noted she could continue with her currently prescribed dosage.  If she has recently increase this she will need to have lab work.

## 2019-04-26 NOTE — Telephone Encounter (Signed)
Patient requesting different dosage on furosemide to 40 mg daily ?

## 2019-04-27 NOTE — Telephone Encounter (Signed)
Relation to pt: self  Call back number: [phone] work #     Reason for call:  Patient states she's currently taken 2x daily of furosemide (LASIX) 20 MG tablet patient unsure when she started. Patient also checking on the status of Vitamin D question mentioned below.

## 2019-04-28 MED ORDER — FUROSEMIDE 40 MG PO TABS: ORAL_TABLET | ORAL | 1 refills | Status: DC

## 2019-04-28 NOTE — Telephone Encounter (Signed)
Left message for patient to call office [facility] nurse may advise.

## 2019-04-28 NOTE — Telephone Encounter (Signed)
Lasix sent to pharmacy.  She she will be fine to add the recommended vitamin D 1000 international units supplement to what she is already taking.

## 2019-04-28 NOTE — Telephone Encounter (Signed)
Patient stated sh eis taking 40 mg daily but is not sure when she started and still has question concerning vitamin D.

## 2019-04-29 NOTE — Telephone Encounter (Signed)
Pt returned call and aware of Dr recommendations and Rx at pharmacy.  Nothing further needed

## 2019-05-15 NOTE — Progress Notes (Signed)
[place]  [phone]  [phone]   DOS 12/17/2015  Wanda Lopez DOB: 1961-01-10  MR#: [id number]  OTL#:[id number]  Patient Care Team: Leone Haven, MD as PCP - General (Family Medicine) Gae Dry, MD as Referring Physician (Obstetrics and Gynecology) Bary Castilla Forest Gleason, MD (General Surgery) Anabel Bene, MD as Referring Physician (Neurology) Lloyd Huger, MD as Consulting Physician (Oncology)  I connected with Wanda Lopez on 05/19/19 at  9:45 AM EDT by video enabled telemedicine visit and verified that I am speaking with the correct person using two identifiers.   I discussed the limitations, risks, security and privacy concerns of performing an evaluation and management service by telemedicine and the availability of in-person appointments. I also discussed with the patient that there may be a patient responsible charge related to this service. The patient expressed understanding and agreed to proceed.   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  INTERVAL HISTORY: Patient agreed to video enabled telemedicine visit for her routine yearly evaluation.  Due to technical difficulties, clinic visit was transitioned to telephone.  She continues to feel well and remains asymptomatic.  She continues to have a mild peripheral neuropathy that is chronic and unchanged and does not affect her day-to-day activity.  She has no other neurologic complaints.  She denies any recent fevers or illnesses.  She denies any chest pain, shortness of breath, cough, or hemoptysis.  She has no nausea, vomiting, constipation, or diarrhea.  She has no urinary complaints.  Patient feels at her baseline offers no further specific complaints today.  REVIEW OF  SYSTEMS:   Review of Systems  Constitutional: Negative.  Negative for fever, malaise/fatigue and weight loss.  Respiratory: Negative.  Negative for cough, hemoptysis and shortness of breath.   Cardiovascular: Negative.  Negative for chest pain and leg swelling.  Gastrointestinal: Negative.  Negative for abdominal pain.  Genitourinary: Negative.  Negative for dysuria.  Musculoskeletal: Negative.  Negative for myalgias.  Skin: Negative.  Negative for rash.  Neurological: Positive for sensory change. Negative for dizziness, focal weakness, weakness and headaches.  Psychiatric/Behavioral: Negative.  The patient is not nervous/anxious.    As per HPI. Otherwise, a complete review of systems is negative.   PAST MEDICAL HISTORY: Past Medical History:  Diagnosis Date  . Breast cancer of lower-outer quadrant of right female breast ([place]) 02/09/2015   1.7 triple negative, medullary carcinoma. Wide excision, sentinel node biopsy.  . Dehydration 05/28/2015  . Fainting spell   . Malignant neoplasm of lower-outer quadrant of right female breast ([place]) 12/17/2015  . Migraines   . Personal history of chemotherapy   . Personal history of radiation therapy     PAST SURGICAL HISTORY: Past Surgical History:  Procedure Laterality Date  . BREAST EXCISIONAL BIOPSY Right 02-21-15   wide excision/sentinel node biopsy.  Marland Kitchen BREAST LUMPECTOMY Right 2016   internal radiation, chemo   . COLONOSCOPY  2013   Dr Vira Agar  . FLEXIBLE BRONCHOSCOPY N/A 08/31/2017   Procedure: FLEXIBLE BRONCHOSCOPY;  Surgeon: Laverle Hobby, MD;  Location: [facility] ORS;  Service: Pulmonary;  Laterality: N/A;  . PORTACATH PLACEMENT  03-29-15   Dr Bary Castilla    FAMILY HISTORY Family History  Problem Relation Age of Onset  . Hypertension Mother   . Heart failure Mother   .  Osteoarthritis Mother   . Stroke Mother   . Hypertension Father   . Diabetes Father   . Osteoporosis Sister   . Epilepsy Sister   . Breast cancer Neg Hx      GYNECOLOGIC HISTORY:  No LMP recorded. (Menstrual status: Chemotherapy).     ADVANCED DIRECTIVES:    HEALTH MAINTENANCE: Social History   Tobacco Use  . Smoking status: Never Smoker  . Smokeless tobacco: Never Used  Substance Use Topics  . Alcohol use: No    Alcohol/week: 0.0 standard drinks  . Drug use: No     Colonoscopy:  PAP:  Bone density:  Lipid panel:  No Known Allergies  Current Outpatient Medications  Medication Sig Dispense Refill  . b complex vitamins tablet Take 2 tablets by mouth daily.    . Biotin 3 MG TABS Take 5,000 mg by mouth daily.     . Calcium Carb-Cholecalciferol (CALCIUM 600+D) 600-800 MG-UNIT TABS Take 1 tablet by mouth daily.    . cholecalciferol (VITAMIN D3) 25 MCG (1000 UT) tablet Take 1,000 Units by mouth daily.    Marland Kitchen CINNAMON PO Take 1,000 mg by mouth daily.     Marland Kitchen ELDERBERRY PO Take by mouth.    . furosemide (LASIX) 40 MG tablet TAKE 1 TABLET BY MOUTH DAILY AS NEEDED FOR EDEMA 90 tablet 1  . Omega-3 Fatty Acids (OMEGA 3 500) 500 MG CAPS Take 1,000 mg by mouth daily.     . potassium chloride SA (KLOR-CON M20) 20 MEQ tablet Take 1 tablet (20 mEq total) by mouth 2 (two) times daily. 180 tablet 1  . Probiotic Product ([employer] ADV PROBIOTIC GUMMIES PO) Take 2 tablets by mouth daily.     . vitamin B-12 (CYANOCOBALAMIN) 1000 MCG tablet Take 1,000 mcg by mouth daily.    . vitamin E 1000 UNIT capsule Take 1,000 Units by mouth daily.     No current facility-administered medications for this visit.    Facility-Administered Medications Ordered in Other Visits  Medication Dose Route Frequency Provider Last Rate Last Dose  . sodium chloride 0.9 % injection 10 mL  10 mL Intracatheter PRN Lloyd Huger, MD   10 mL at 06/13/15 1403  . sodium chloride 0.9 % injection 10 mL  10 mL Intravenous PRN Lloyd Huger, MD   10 mL at 10/30/15 1442    OBJECTIVE: There were no vitals taken for this visit.   There is no height or weight on file to calculate BMI.     ECOG FS:0 - Asymptomatic  General: Well-developed, well-nourished, no acute distress. HEENT: Normocephalic. Neuro: Alert, answering all questions appropriately. Cranial nerves grossly intact. Skin: No rashes or petechiae noted. Psych: Normal affect.   LAB RESULTS:  Appointment on 05/17/2019  Component Date Value Ref Range Status  . CA 27.29 05/17/2019 24.3  0.0 - 38.6 U/mL Final   Comment: (NOTE) Siemens Centaur Immunochemiluminometric Methodology [facility]) Values obtained with different assay methods or kits cannot be used interchangeably. Results cannot be interpreted as absolute evidence of the presence or absence of malignant disease. Performed At: [facility] [place], [place] [id number] Rush Farmer MD QZ:[id number]    Lab Results  Component Value Date   LABCA2 24.7 03/17/2017    STUDIES: No results found.  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  PLAN:   1. 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 Patient is status  post wide excision, MammoSite therapy, chemotherapy with Adriamycin and Cytoxan, as well as 12 weeks of Taxol.  She completed her chemotherapy on September 17, 2015.  Patient's most recent mammogram on December 22, 2018 was reported as BI-RADS 2.  Repeat in January 2021.  Patient prefers to have all her breast exams by her primary care provider.  Her CA 27-29 continues to be within normal limits at 24.3.  Return to clinic in 1 year with repeat laboratory work and further evaluation.  2. Peripheral neuropathy.  Chronic and unchanged.  Possibly secondary to Taxol chemotherapy. Patient has discontinued Cymbalta and gabapentin. She no longer follows up with neurology.  3. Hemoptysis: Resolved. Follow up CT scan from November 10, 2017 revealed complete resolution of ground glass opacities in the right lung.  Patient previously  underwent bronchoscopy which was unrevealing. No further imaging is necessary.  I provided 15 minutes of face-to-face video visit time during this encounter, and > 50% was spent counseling as documented under my assessment & plan.   Patient expressed understanding and was in agreement with this plan. She also understands that She can call clinic at any time with any questions, concerns, or complaints.    Breast cancer [facility])   Staging form: Breast, AJCC 7th Edition     Clinical stage from 03/26/2015: Stage IA (T1c, N0, M0) - Signed by Lloyd Huger, MD on 03/26/2015   Lloyd Huger, MD   05/19/2019 6:39 AM

## 2019-05-17 ENCOUNTER — Other Ambulatory Visit: Payer: Self-pay

## 2019-05-17 ENCOUNTER — Ambulatory Visit: Payer: 59 | Admitting: Oncology

## 2019-05-17 ENCOUNTER — Inpatient Hospital Stay: Payer: 59 | Attending: Oncology

## 2019-05-17 ENCOUNTER — Other Ambulatory Visit: Payer: Self-pay | Admitting: *Deleted

## 2019-05-17 DIAGNOSIS — C50511 Malignant neoplasm of lower-outer quadrant of right female breast: Secondary | ICD-10-CM

## 2019-05-17 DIAGNOSIS — Z171 Estrogen receptor negative status [ER-]: Secondary | ICD-10-CM | POA: Insufficient documentation

## 2019-05-17 DIAGNOSIS — Z923 Personal history of irradiation: Secondary | ICD-10-CM | POA: Insufficient documentation

## 2019-05-17 DIAGNOSIS — Z9221 Personal history of antineoplastic chemotherapy: Secondary | ICD-10-CM | POA: Insufficient documentation

## 2019-05-17 NOTE — Progress Notes (Signed)
c 

## 2019-05-18 ENCOUNTER — Other Ambulatory Visit: Payer: Self-pay

## 2019-05-18 ENCOUNTER — Encounter: Payer: Self-pay | Admitting: Oncology

## 2019-05-18 ENCOUNTER — Inpatient Hospital Stay (HOSPITAL_BASED_OUTPATIENT_CLINIC_OR_DEPARTMENT_OTHER): Payer: 59 | Admitting: Oncology

## 2019-05-18 DIAGNOSIS — Z923 Personal history of irradiation: Secondary | ICD-10-CM

## 2019-05-18 DIAGNOSIS — C50511 Malignant neoplasm of lower-outer quadrant of right female breast: Secondary | ICD-10-CM

## 2019-05-18 DIAGNOSIS — Z171 Estrogen receptor negative status [ER-]: Secondary | ICD-10-CM

## 2019-05-18 DIAGNOSIS — Z9221 Personal history of antineoplastic chemotherapy: Secondary | ICD-10-CM | POA: Diagnosis not present

## 2019-05-18 LAB — CANCER ANTIGEN 27.29: CA 27.29: 24.3 U/mL (ref 0.0–38.6)

## 2019-05-18 NOTE — Progress Notes (Signed)
Patient stated that she had been doing well with no complaints. Patient's Bone Density is scheduled to be on 06/01/2019 and her Mammogram was last done on 12/22/2018.

## 2019-06-01 ENCOUNTER — Ambulatory Visit
Admission: RE | Admit: 2019-06-01 | Discharge: 2019-06-01 | Disposition: A | Discharge status: Home / Self Care | Payer: 59 | Source: Ambulatory Visit | Attending: Family Medicine | Admitting: Family Medicine

## 2019-06-01 ENCOUNTER — Other Ambulatory Visit: Payer: Self-pay

## 2019-06-01 DIAGNOSIS — M810 Age-related osteoporosis without current pathological fracture: Secondary | ICD-10-CM | POA: Diagnosis present

## 2019-06-07 ENCOUNTER — Telehealth: Payer: Self-pay | Admitting: Family Medicine

## 2019-06-07 NOTE — Telephone Encounter (Signed)
I gave patient her Bone Density results and informed her that the provider wants her to try medication for Osteoporosis, patient states she is having a physical 06/20/2019 and she will wait and discuss this at that visit.  Florice Hindle,cma

## 2019-06-07 NOTE — Telephone Encounter (Signed)
Copied from [place] [phone]. Topic: Quick Communication - Lab Results (Clinic Use ONLY) >> Jun 06, 2019  9:53 AM Gordy Councilman, CMA wrote: Called patient to inform them of 06/01/2019 lab results. When patient returns call, triage nurse may disclose results.  Nina,cma  Pt calling back for results. Unable to reach a nurse.

## 2019-06-07 NOTE — Telephone Encounter (Signed)
Noted  

## 2019-06-16 ENCOUNTER — Other Ambulatory Visit: Payer: Self-pay

## 2019-06-20 ENCOUNTER — Other Ambulatory Visit: Payer: Self-pay

## 2019-06-20 ENCOUNTER — Ambulatory Visit (INDEPENDENT_AMBULATORY_CARE_PROVIDER_SITE_OTHER): Payer: 59 | Admitting: Family Medicine

## 2019-06-20 DIAGNOSIS — Z0000 Encounter for general adult medical examination without abnormal findings: Secondary | ICD-10-CM

## 2019-06-20 DIAGNOSIS — M810 Age-related osteoporosis without current pathological fracture: Secondary | ICD-10-CM

## 2019-06-20 NOTE — Patient Instructions (Signed)
Nice to see you. Please consider whether or not you want to take Fosamax or Prolia for your osteoporosis.  Please continue with vitamin D supplementation. Please continue to exercise and monitor your diet.  We will check lab work as planned in August.

## 2019-06-20 NOTE — Progress Notes (Signed)
Wanda Rumps, MD Phone: [phone]  Wanda Lopez is a 58 y.o. female who presents today for CPE.  Exercises by walking on the treadmill 2-3 times a week.  She also gardens every other day for about 2 hours. Diet consists of less fast food than previously.  She is taking sandwiches for lunch.  Salads.  Does get some vegetables. Pap smear 05/31/19 with GYN was negative for abnormalities and HPV. Mammogram 12/22/2018 BI-RADS 2.  She follows with oncology in general surgery for her history of breast cancer. Colonoscopy 01/08/2012 was negative.  10-year recall. She is postmenopausal. She is not sexually active. No family history of breast cancer, ovarian cancer, or colon cancer. Hepatitis C and HIV screening completed. Tetanus vaccine up-to-date 01/15/2017. She defers Shingrix. No tobacco use, alcohol use, or illicit drug use. She sees a Pharmacist, community and an ophthalmologist. Bone density scan revealed osteoporosis.  She is taking calcium and vitamin D.  Active Ambulatory Problems    Diagnosis Date Noted  . Breast cancer ([place]) 01/30/2015  . Malignant neoplasm of lower-outer quadrant of right breast of female, estrogen receptor negative ([place]) 03/07/2015  . Neuropathy due to chemotherapeutic drug ([place]) 06/30/2016  . Osteoporosis 06/30/2016  . Bilateral lower extremity edema 10/14/2016  . Elevated LDL cholesterol level 04/18/2019  . Routine general medical examination at a health care facility 06/21/2019   Resolved Ambulatory Problems    Diagnosis Date Noted  . Dehydration 05/28/2015  . Chemotherapy-induced neuropathy ([place]) 11/22/2016  . Hemoptysis 08/12/2017   Past Medical History:  Diagnosis Date  . Breast cancer of lower-outer quadrant of right female breast ([place]) 02/09/2015  . Fainting spell   . Malignant neoplasm of lower-outer quadrant of right female breast ([place]) 12/17/2015  . Migraines   . Personal history of chemotherapy   . Personal history of radiation therapy     Family  History  Problem Relation Age of Onset  . Hypertension Mother   . Heart failure Mother   . Osteoarthritis Mother   . Stroke Mother   . Hypertension Father   . Diabetes Father   . Osteoporosis Sister   . Epilepsy Sister   . Breast cancer Neg Hx     Social History   Socioeconomic History  . Marital status: Married    Spouse name: Not on file  . Number of children: Not on file  . Years of education: Not on file  . Highest education level: Not on file  Occupational History  . Not on file  Social Needs  . Financial resource strain: Not on file  . Food insecurity    Worry: Not on file    Inability: Not on file  . Transportation needs    Medical: Not on file    Non-medical: Not on file  Tobacco Use  . Smoking status: Never Smoker  . Smokeless tobacco: Never Used  Substance and Sexual Activity  . Alcohol use: No    Alcohol/week: 0.0 standard drinks  . Drug use: No  . Sexual activity: Not on file  Lifestyle  . Physical activity    Days per week: Not on file    Minutes per session: Not on file  . Stress: Not on file  Relationships  . Social Herbalist on phone: Not on file    Gets together: Not on file    Attends religious service: Not on file    Active member of club or organization: Not on file    Attends meetings of  clubs or organizations: Not on file    Relationship status: Not on file  . Intimate partner violence    Fear of current or ex partner: Not on file    Emotionally abused: Not on file    Physically abused: Not on file    Forced sexual activity: Not on file  Other Topics Concern  . Not on file  Social History Narrative  . Not on file    ROS  General:  Negative for nexplained weight loss, fever Skin: Negative for new or changing mole, sore that won't heal HEENT: Negative for trouble hearing, trouble seeing, ringing in ears, mouth sores, hoarseness, change in voice, dysphagia. CV:  Negative for chest pain, dyspnea, edema, palpitations  Resp: Negative for cough, dyspnea, hemoptysis GI: Negative for nausea, vomiting, diarrhea, constipation, abdominal pain, melena, hematochezia. GU: Negative for dysuria, incontinence, urinary hesitance, hematuria, vaginal or penile discharge, polyuria, sexual difficulty, lumps in testicle or breasts MSK: Negative for muscle cramps or aches, joint pain or swelling Neuro: Positive for chronic numbness related to prior chemotherapy treatment, negative for headaches, weakness, dizziness, passing out/fainting Psych: Negative for depression, anxiety, memory problems  Objective  Physical Exam Vitals:   06/20/19 0809  BP: 102/68  Pulse: 84  Resp: 18  Temp: 98.6 F (37 C)  SpO2: 97%    BP Readings from Last 3 Encounters:  06/20/19 102/68  01/04/19 107/74  12/16/18 102/70   Wt Readings from Last 3 Encounters:  06/20/19 153 lb 8 oz (69.6 kg)  01/04/19 157 lb (71.2 kg)  12/16/18 155 lb 3.3 oz (70.4 kg)    Physical Exam Constitutional:      General: She is not in acute distress.    Appearance: She is not diaphoretic.  HENT:     Head: Normocephalic and atraumatic.  Eyes:     Conjunctiva/sclera: Conjunctivae normal.     Pupils: Pupils are equal, round, and reactive to light.  Cardiovascular:     Rate and Rhythm: Normal rate and regular rhythm.     Heart sounds: Normal heart sounds.  Pulmonary:     Effort: Pulmonary effort is normal.     Breath sounds: Normal breath sounds.  Abdominal:     General: Bowel sounds are normal. There is no distension.     Palpations: Abdomen is soft.     Tenderness: There is no abdominal tenderness. There is no guarding or rebound.  Musculoskeletal:     Right lower leg: No edema.     Left lower leg: No edema.  Lymphadenopathy:     Cervical: No cervical adenopathy.  Skin:    General: Skin is warm and dry.  Neurological:     Mental Status: She is alert.  Psychiatric:        Mood and Affect: Mood normal.      Assessment/Plan:   Routine  general medical examination at a health care facility Physical exam completed.  Encouraged continued activity and monitoring her diet.  She will continue to follow with gynecology for her pelvic exams and Pap smears.  She will continue to follow with oncology and surgery regarding her breast cancer history.  She has deferred Shingrix at this time.  We will plan to recheck vitamin D in August.  Osteoporosis Stable on most recent DEXA scan.  I have recommended that she start treatment for this specifically with Fosamax though we could consider Prolia if she prefers.  She would like to look at the information regarding these medications and then make  a decision.  She will continue on calcium and vitamin D.   No orders of the defined types were placed in this encounter.   No orders of the defined types were placed in this encounter.    Wanda Rumps, MD [place]

## 2019-06-21 ENCOUNTER — Encounter: Payer: Self-pay | Admitting: Family Medicine

## 2019-06-21 DIAGNOSIS — Z0000 Encounter for general adult medical examination without abnormal findings: Secondary | ICD-10-CM | POA: Insufficient documentation

## 2019-06-21 NOTE — Assessment & Plan Note (Addendum)
Physical exam completed.  Encouraged continued activity and monitoring her diet.  She will continue to follow with gynecology for her pelvic exams and Pap smears.  She will continue to follow with oncology and surgery regarding her breast cancer history.  She has deferred Shingrix at this time.  We will plan to recheck vitamin D in August.

## 2019-06-21 NOTE — Assessment & Plan Note (Signed)
Stable on most recent DEXA scan.  I have recommended that she start treatment for this specifically with Fosamax though we could consider Prolia if she prefers.  She would like to look at the information regarding these medications and then make a decision.  She will continue on calcium and vitamin D.

## 2019-06-27 ENCOUNTER — Encounter: Payer: Self-pay | Admitting: General Surgery

## 2019-07-20 ENCOUNTER — Other Ambulatory Visit: Payer: 59

## 2019-07-27 ENCOUNTER — Other Ambulatory Visit (INDEPENDENT_AMBULATORY_CARE_PROVIDER_SITE_OTHER): Payer: 59

## 2019-07-27 ENCOUNTER — Other Ambulatory Visit: Payer: Self-pay

## 2019-07-27 DIAGNOSIS — E7800 Pure hypercholesterolemia, unspecified: Secondary | ICD-10-CM | POA: Diagnosis not present

## 2019-07-27 DIAGNOSIS — M810 Age-related osteoporosis without current pathological fracture: Secondary | ICD-10-CM

## 2019-07-27 LAB — VITAMIN D 25 HYDROXY (VIT D DEFICIENCY, FRACTURES): VITD: 41.68 ng/mL (ref 30.00–100.00)

## 2019-07-27 LAB — COMPREHENSIVE METABOLIC PANEL
ALT: 14 U/L (ref 0–35)
AST: 27 U/L (ref 0–37)
Albumin: 4.1 g/dL (ref 3.5–5.2)
Alkaline Phosphatase: 70 U/L (ref 39–117)
BUN: 13 mg/dL (ref 6–23)
CO2: 27 mEq/L (ref 19–32)
Calcium: 9.1 mg/dL (ref 8.4–10.5)
Chloride: 105 mEq/L (ref 96–112)
Creatinine, Ser: 0.57 mg/dL (ref 0.40–1.20)
GFR: 108.52 mL/min (ref >60.00)
Glucose, Bld: 76 mg/dL (ref 70–99)
Potassium: 3.5 mEq/L (ref 3.5–5.1)
Sodium: 139 mEq/L (ref 135–145)
Total Bilirubin: 0.7 mg/dL (ref 0.2–1.2)
Total Protein: 7.2 g/dL (ref 6.0–8.3)

## 2019-07-27 LAB — LDL CHOLESTEROL, DIRECT: Direct LDL: 104 mg/dL

## 2019-10-21 ENCOUNTER — Ambulatory Visit: Payer: 59 | Admitting: Family Medicine

## 2019-11-07 ENCOUNTER — Other Ambulatory Visit: Payer: Self-pay | Admitting: Family Medicine

## 2019-11-07 ENCOUNTER — Other Ambulatory Visit: Payer: Self-pay

## 2019-11-07 DIAGNOSIS — Z171 Estrogen receptor negative status [ER-]: Secondary | ICD-10-CM

## 2019-11-10 ENCOUNTER — Other Ambulatory Visit: Payer: Self-pay | Admitting: General Surgery

## 2019-11-10 DIAGNOSIS — C50511 Malignant neoplasm of lower-outer quadrant of right female breast: Secondary | ICD-10-CM

## 2019-12-22 ENCOUNTER — Ambulatory Visit
Admission: RE | Admit: 2019-12-22 | Discharge: 2019-12-22 | Disposition: A | Discharge status: Home / Self Care | Payer: 59 | Source: Ambulatory Visit | Attending: Radiation Oncology | Admitting: Radiation Oncology

## 2019-12-22 ENCOUNTER — Other Ambulatory Visit: Payer: Self-pay

## 2019-12-22 VITALS — BP 101/72 | HR 74 | Temp 96.2°F | Resp 16 | Wt 146.0 lb

## 2019-12-22 DIAGNOSIS — Z853 Personal history of malignant neoplasm of breast: Secondary | ICD-10-CM | POA: Insufficient documentation

## 2019-12-22 DIAGNOSIS — Z923 Personal history of irradiation: Secondary | ICD-10-CM | POA: Diagnosis not present

## 2019-12-22 DIAGNOSIS — C50511 Malignant neoplasm of lower-outer quadrant of right female breast: Secondary | ICD-10-CM

## 2019-12-22 NOTE — Progress Notes (Signed)
Radiation Oncology Follow up Note  Name: Wanda Lopez   Date:   12/22/2019 MRN:  [id number] DOB: Apr 10, 1961    This 59 y.o. female presents to the clinic today for 4-1/2-year follow-up status post accelerated partial breast radiation to her right breast for triple negative invasive mammary carcinoma.  REFERRING PROVIDER: Leone Haven, MD  HPI: Patient is a 59 year old female now out 4-1/2 years having completed.  Accelerated partial breast radiation now out close to 5 years for triple negative invasive mammary carcinoma.  Seen today in routine follow-up she is doing well.  She specifically denies breast tenderness cough or bone pain.  Her last mammogram was 1 year prior which I have reviewed was BI-RADS 2 benign.  She will have another mammogram this month which I also will review when it becomes available.  She is not on antiestrogen therapy based on the triple negative nature of her disease.  COMPLICATIONS OF TREATMENT: none  FOLLOW UP COMPLIANCE: keeps appointments   PHYSICAL EXAM:  BP 101/72   Pulse 74   Temp (!) 96.2 F (35.7 C)   Resp 16   Wt 146 lb (66.2 kg)   SpO2 96%   BMI 25.06 kg/m  Lungs are clear to A&P cardiac examination essentially unremarkable with regular rate and rhythm. No dominant mass or nodularity is noted in either breast in 2 positions examined. Incision is well-healed. No axillary or supraclavicular adenopathy is appreciated. Cosmetic result is excellent.  Well-developed well-nourished patient in NAD. HEENT reveals PERLA, EOMI, discs not visualized.  Oral cavity is clear. No oral mucosal lesions are identified. Neck is clear without evidence of cervical or supraclavicular adenopathy. Lungs are clear to A&P. Cardiac examination is essentially unremarkable with regular rate and rhythm without murmur rub or thrill. Abdomen is benign with no organomegaly or masses noted. Motor sensory and DTR levels are equal and symmetric in the upper and lower extremities.  Cranial nerves II through XII are grossly intact. Proprioception is intact. No peripheral adenopathy or edema is identified. No motor or sensory levels are noted. Crude visual fields are within normal range.  RADIOLOGY RESULTS: Mammograms reviewed compatible with above-stated findings.  PLAN: Present time patient is doing well now close to 5 years out.  I am going to discontinue follow-up care.  I would be happy to reevaluate her anytime the future should that be indicated.  Patient continues close follow-up care with Dr. Tollie Pizza and her family doctor.  She knows to call with any concerns.  I would like to take this opportunity to thank you for allowing me to participate in the care of your patient.Noreene Filbert, MD

## 2019-12-26 ENCOUNTER — Ambulatory Visit
Admission: RE | Admit: 2019-12-26 | Discharge: 2019-12-26 | Disposition: A | Discharge status: Home / Self Care | Payer: 59 | Source: Ambulatory Visit | Attending: General Surgery | Admitting: General Surgery

## 2019-12-26 ENCOUNTER — Other Ambulatory Visit: Payer: 59

## 2019-12-26 DIAGNOSIS — C50511 Malignant neoplasm of lower-outer quadrant of right female breast: Secondary | ICD-10-CM

## 2019-12-26 DIAGNOSIS — Z171 Estrogen receptor negative status [ER-]: Secondary | ICD-10-CM

## 2020-02-04 ENCOUNTER — Other Ambulatory Visit: Payer: Self-pay | Admitting: Family Medicine

## 2020-02-06 NOTE — Telephone Encounter (Signed)
Please contact the patient and see if she is taking the potassium every day or only with her Lasix.  Thanks.

## 2020-02-06 NOTE — Telephone Encounter (Signed)
Refill request for K-LOR, last seen & last filled 04-18-19.  Please advise. Last potassium check was 07-27-19 result was 3.5

## 2020-02-06 NOTE — Telephone Encounter (Signed)
Left message for patient to return call back.  

## 2020-03-05 ENCOUNTER — Other Ambulatory Visit: Payer: Self-pay | Admitting: Family Medicine

## 2020-05-02 DIAGNOSIS — N898 Other specified noninflammatory disorders of vagina: Secondary | ICD-10-CM | POA: Diagnosis not present

## 2020-05-14 ENCOUNTER — Inpatient Hospital Stay: Payer: BC Managed Care – PPO | Attending: Oncology

## 2020-05-14 ENCOUNTER — Other Ambulatory Visit: Payer: Self-pay

## 2020-05-14 DIAGNOSIS — G629 Polyneuropathy, unspecified: Secondary | ICD-10-CM | POA: Diagnosis not present

## 2020-05-14 DIAGNOSIS — Z79899 Other long term (current) drug therapy: Secondary | ICD-10-CM | POA: Insufficient documentation

## 2020-05-14 DIAGNOSIS — Z171 Estrogen receptor negative status [ER-]: Secondary | ICD-10-CM | POA: Diagnosis not present

## 2020-05-14 DIAGNOSIS — C50511 Malignant neoplasm of lower-outer quadrant of right female breast: Secondary | ICD-10-CM | POA: Insufficient documentation

## 2020-05-15 LAB — CANCER ANTIGEN 27.29: CA 27.29: 22.7 U/mL (ref 0.0–38.6)

## 2020-05-15 NOTE — Progress Notes (Signed)
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  INTERVAL HISTORY: Patient returns to clinic today for routine yearly evaluation.  She continues to feel well and remains asymptomatic. She continues to have a mild peripheral neuropathy that is chronic and unchanged and does not affect her day-to-day activity.  She has no other neurologic complaints.  She denies any recent fevers or illnesses.  She denies any chest pain, shortness of breath, cough, or hemoptysis.  She has no nausea, vomiting, constipation, or diarrhea.  She has no urinary complaints.  Patient offers no specific complaints today.  REVIEW OF SYSTEMS:   Review of Systems  Constitutional: Negative.  Negative for fever, malaise/fatigue and weight loss.  Respiratory: Negative.  Negative for cough, hemoptysis and shortness of breath.   Cardiovascular: Negative.  Negative for chest pain and leg swelling.  Gastrointestinal: Negative.  Negative for abdominal pain.  Genitourinary: Negative.  Negative for dysuria.  Musculoskeletal: Negative.  Negative for myalgias.  Skin: Negative.  Negative for rash.  Neurological: Positive for sensory change. Negative for dizziness, focal weakness, weakness and headaches.  Psychiatric/Behavioral: Negative.  The patient is not nervous/anxious.    As per HPI. Otherwise, a complete review of systems is negative.   PAST MEDICAL HISTORY: Past Medical History:  Diagnosis Date  . Breast cancer  of lower-outer quadrant of right female breast ([place]) 02/09/2015   1.7 triple negative, medullary carcinoma. Wide excision, sentinel node biopsy.  . Dehydration 05/28/2015  . Fainting spell   . Malignant neoplasm of lower-outer quadrant of right female breast ([place]) 12/17/2015  . Migraines   . Personal history of chemotherapy   . Personal history of radiation therapy     PAST SURGICAL HISTORY: Past Surgical History:  Procedure Laterality Date  . BREAST EXCISIONAL BIOPSY Right 02-21-15   wide excision/sentinel node biopsy.  Marland Kitchen BREAST LUMPECTOMY Right 2016   internal radiation, chemo   . COLONOSCOPY  2013   Dr Vira Agar  . FLEXIBLE BRONCHOSCOPY N/A 08/31/2017   Procedure: FLEXIBLE BRONCHOSCOPY;  Surgeon: Laverle Hobby, MD;  Location: [facility] ORS;  Service: Pulmonary;  Laterality: N/A;  . PORTACATH PLACEMENT  03-29-15   Dr Bary Castilla    FAMILY HISTORY Family History  Problem Relation Age of Onset  . Hypertension Mother   . Heart failure Mother   . Osteoarthritis Mother   . Stroke Mother   . Hypertension Father   . Diabetes Father   . Osteoporosis Sister   . Epilepsy Sister   . Breast cancer Neg Hx     GYNECOLOGIC HISTORY:  No LMP recorded. (Menstrual status: Chemotherapy).     ADVANCED DIRECTIVES:    HEALTH MAINTENANCE: Social History   Tobacco Use  . Smoking status: Never Smoker  . Smokeless tobacco: Never Used  Substance Use Topics  . Alcohol use: No    Alcohol/week: 0.0 standard drinks  . Drug use: No     Colonoscopy:  PAP:  Bone density:  Lipid panel:  No Known Allergies  Current Outpatient Medications  Medication Sig Dispense Refill  . b  complex vitamins tablet Take 1 tablet by mouth daily.     . Biotin 3 MG TABS Take 5,000 mg by mouth daily.     . Calcium Carbonate-Vitamin D 600-200 MG-UNIT TABS Take 2 tablets by mouth daily.    . cholecalciferol (VITAMIN D3) 25 MCG (1000 UT) tablet Take 1,000 Units by mouth daily.    Marland Kitchen CINNAMON PO Take 1,000 mg by  mouth daily.     . clobetasol ointment (TEMOVATE) 0.05 % Apply 1 fingertip unit (FTU) nightly 4 weeks and then every other day f or 4 weeks then twice weekly for 4 weeks. Maintenance Twice weekly application. No more than 1 30g tube in every 6 month period.    Marland Kitchen ELDERBERRY PO Take by mouth.    . furosemide (LASIX) 40 MG tablet Take 40 mg by mouth daily.    . Omega-3 Fatty Acids (OMEGA 3 500) 500 MG CAPS Take 1,000 mg by mouth daily.     . potassium chloride SA (KLOR-CON M20) 20 MEQ tablet Take 1 tablet (20 mEq total) by mouth daily. 90 tablet 1  . Probiotic Product ([employer] ADV PROBIOTIC GUMMIES PO) Take 2 tablets by mouth daily.     . vitamin B-12 (CYANOCOBALAMIN) 1000 MCG tablet Take 1,000 mcg by mouth daily.    . vitamin E 1000 UNIT capsule Take 1,000 Units by mouth daily.     No current facility-administered medications for this visit.   Facility-Administered Medications Ordered in Other Visits  Medication Dose Route Frequency Provider Last Rate Last Admin  . sodium chloride 0.9 % injection 10 mL  10 mL Intracatheter PRN Lloyd Huger, MD   10 mL at 06/13/15 1403  . sodium chloride 0.9 % injection 10 mL  10 mL Intravenous PRN Lloyd Huger, MD   10 mL at 10/30/15 1442    OBJECTIVE: There were no vitals taken for this visit.   There is no height or weight on file to calculate BMI.    ECOG FS:0 - Asymptomatic  General: Well-developed, well-nourished, no acute distress. Eyes: Pink conjunctiva, anicteric sclera. HEENT: Normocephalic, moist mucous membranes. Breast: Exam deferred today. Lungs: No audible wheezing or coughing. Heart: Regular rate and rhythm. Abdomen: Soft, nontender, no obvious distention. Musculoskeletal: No edema, cyanosis, or clubbing. Neuro: Alert, answering all questions appropriately. Cranial nerves grossly intact. Skin: No rashes or petechiae noted. Psych: Normal affect.    LAB RESULTS:  No visits with results within 3 Day(s) from this visit.   Latest known visit with results is:  Appointment on 05/14/2020  Component Date Value Ref Range Status  . CA 27.29 05/14/2020 22.7  0.0 - 38.6 U/mL Final   Comment: (NOTE) Siemens Centaur Immunochemiluminometric Methodology [facility]) Values obtained with different assay methods or kits cannot be used interchangeably. Results cannot be interpreted as absolute evidence of the presence or absence of malignant disease. Performed At: [facility] [place], [place] [id number] Rush Farmer MD XT:[id number]      STUDIES: No results found.  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  PLAN:   1. 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 Patient is status post wide excision, MammoSite therapy, chemotherapy with Adriamycin and Cytoxan, as well as 12 weeks of Taxol.  She completed her chemotherapy on September 17, 2015.  Patient's most recent mammogram on December 26, 2019 was reported as BI-RADS 2.  Repeat in January 2022.  Her CA 27-29 continues to be within normal limits at 22.7. Patient prefers to have all her breast exams by her primary care provider.  Return to clinic in 1 year with repeat laboratory work and video assisted telemedicine visit. 2. Peripheral neuropathy.  Chronic and unchanged.  Possibly secondary to Taxol chemotherapy. Patient has discontinued Cymbalta and gabapentin. She no longer follows up with neurology.  3. Hemoptysis: Resolved. Follow up CT scan from November 10, 2017 revealed complete resolution of ground glass opacities in the right lung.  Patient previously underwent bronchoscopy which was unrevealing. No further imaging is necessary.  I spent a total of 20 minutes reviewing chart data, face-to-face evaluation with the patient, counseling and coordination of care as detailed above.   Patient expressed understanding and was in agreement  with this plan. She also understands that She can call clinic at any time with any questions, concerns, or complaints.    Breast cancer [facility])   Staging form: Breast, AJCC 7th Edition     Clinical stage from 03/26/2015: Stage IA (T1c, N0, M0) - Signed by Lloyd Huger, MD on 03/26/2015   Lloyd Huger, MD   05/19/2020 9:31 AM

## 2020-05-16 ENCOUNTER — Encounter: Payer: Self-pay | Admitting: Oncology

## 2020-05-16 NOTE — Progress Notes (Signed)
Patient called for pre assessment. She denies any pain or concerns.  

## 2020-05-17 ENCOUNTER — Inpatient Hospital Stay (HOSPITAL_BASED_OUTPATIENT_CLINIC_OR_DEPARTMENT_OTHER): Payer: BC Managed Care – PPO | Admitting: Oncology

## 2020-05-17 ENCOUNTER — Other Ambulatory Visit: Payer: Self-pay

## 2020-05-17 DIAGNOSIS — Z79899 Other long term (current) drug therapy: Secondary | ICD-10-CM | POA: Diagnosis not present

## 2020-05-17 DIAGNOSIS — C50511 Malignant neoplasm of lower-outer quadrant of right female breast: Secondary | ICD-10-CM | POA: Diagnosis not present

## 2020-05-17 DIAGNOSIS — Z171 Estrogen receptor negative status [ER-]: Secondary | ICD-10-CM | POA: Diagnosis not present

## 2020-05-17 DIAGNOSIS — G629 Polyneuropathy, unspecified: Secondary | ICD-10-CM | POA: Diagnosis not present

## 2020-06-14 DIAGNOSIS — Z853 Personal history of malignant neoplasm of breast: Secondary | ICD-10-CM | POA: Diagnosis not present

## 2020-06-14 DIAGNOSIS — Z01419 Encounter for gynecological examination (general) (routine) without abnormal findings: Secondary | ICD-10-CM | POA: Diagnosis not present

## 2020-06-14 DIAGNOSIS — Z1211 Encounter for screening for malignant neoplasm of colon: Secondary | ICD-10-CM | POA: Diagnosis not present

## 2020-06-14 DIAGNOSIS — Z1331 Encounter for screening for depression: Secondary | ICD-10-CM | POA: Diagnosis not present

## 2020-06-20 ENCOUNTER — Ambulatory Visit (INDEPENDENT_AMBULATORY_CARE_PROVIDER_SITE_OTHER): Payer: BC Managed Care – PPO | Admitting: Family Medicine

## 2020-06-20 ENCOUNTER — Other Ambulatory Visit: Payer: Self-pay

## 2020-06-20 ENCOUNTER — Encounter: Payer: Self-pay | Admitting: Family Medicine

## 2020-06-20 VITALS — BP 110/70 | HR 65 | Temp 97.3°F | Ht 64.0 in | Wt 145.8 lb

## 2020-06-20 DIAGNOSIS — Z13220 Encounter for screening for lipoid disorders: Secondary | ICD-10-CM

## 2020-06-20 DIAGNOSIS — Z1329 Encounter for screening for other suspected endocrine disorder: Secondary | ICD-10-CM

## 2020-06-20 DIAGNOSIS — E559 Vitamin D deficiency, unspecified: Secondary | ICD-10-CM | POA: Diagnosis not present

## 2020-06-20 DIAGNOSIS — Z0000 Encounter for general adult medical examination without abnormal findings: Secondary | ICD-10-CM | POA: Diagnosis not present

## 2020-06-20 DIAGNOSIS — Z130 Encounter for screening for diseases of the blood and blood-forming organs and certain disorders involving the immune mechanism: Secondary | ICD-10-CM

## 2020-06-20 LAB — CBC
HCT: 40 % (ref 36.0–46.0)
Hemoglobin: 13.7 g/dL (ref 12.0–15.0)
MCHC: 34.3 g/dL (ref 30.0–36.0)
MCV: 98.9 fl (ref 78.0–100.0)
Platelets: 169 10*3/uL (ref 150.0–400.0)
RBC: 4.04 Mil/uL (ref 3.87–5.11)
RDW: 13.6 % (ref 11.5–15.5)
WBC: 4.7 10*3/uL (ref 4.0–10.5)

## 2020-06-20 LAB — COMPREHENSIVE METABOLIC PANEL
ALT: 12 U/L (ref 0–35)
AST: 23 U/L (ref 0–37)
Albumin: 4.1 g/dL (ref 3.5–5.2)
Alkaline Phosphatase: 81 U/L (ref 39–117)
BUN: 21 mg/dL (ref 6–23)
CO2: 31 mEq/L (ref 19–32)
Calcium: 9.7 mg/dL (ref 8.4–10.5)
Chloride: 100 mEq/L (ref 96–112)
Creatinine, Ser: 0.7 mg/dL (ref 0.40–1.20)
GFR: 85.35 mL/min (ref >60.00)
Glucose, Bld: 88 mg/dL (ref 70–99)
Potassium: 4.4 mEq/L (ref 3.5–5.1)
Sodium: 136 mEq/L (ref 135–145)
Total Bilirubin: 0.6 mg/dL (ref 0.2–1.2)
Total Protein: 7.2 g/dL (ref 6.0–8.3)

## 2020-06-20 LAB — TSH: TSH: 2.04 u[IU]/mL (ref 0.35–4.50)

## 2020-06-20 LAB — LIPID PANEL
Cholesterol: 208 mg/dL — ABNORMAL HIGH (ref 0–200)
HDL: 71.9 mg/dL (ref >39.00)
LDL Cholesterol: 118 mg/dL — ABNORMAL HIGH (ref 0–99)
NonHDL: 135.67
Total CHOL/HDL Ratio: 3
Triglycerides: 89 mg/dL (ref 0.0–149.0)
VLDL: 17.8 mg/dL (ref 0.0–40.0)

## 2020-06-20 LAB — VITAMIN D 25 HYDROXY (VIT D DEFICIENCY, FRACTURES): VITD: 42.43 ng/mL (ref 30.00–100.00)

## 2020-06-20 NOTE — Patient Instructions (Signed)
Nice to see you. Please try to start walking some for exercise. Please try to get an adequate fruits and vegetables as well as lean meats. Please consider getting the Shingrix vaccine. We will contact you with your lab results.

## 2020-06-20 NOTE — Progress Notes (Signed)
Wanda Rumps, MD Phone: [phone]  ANNTIONETTE Lopez is a 59 y.o. female who presents today for CPE.  Diet: Trying to eat more vegetables.  Does eat some fruit.  Has half sweet tea half unsweetened tea.  Drinks less than a soda per day.  Eats lean meats. Exercise: Not exercising much.  Has a hard time with this with her work schedule.  Does try to stay active in the yard. Pap smear: 05/31/2019 NIL neg HPV-follows with GYN Colonoscopy: 01/08/2012 10-year recall Mammogram: 12/26/2019 neg Family history-  Colon cancer: No  Breast cancer: Sister at age 60  Ovarian cancer: No Menses: Postmenopausal Vaccines-   Flu: Out of season  Tetanus: Up-to-date  Shingles: Due  COVID19: Up-to-date HIV screening: Up-to-date Hep C Screening: Up-to-date Tobacco use: No Alcohol use: No Illicit Drug use: No Dentist: Yes Ophthalmology: Yes   Active Ambulatory Problems    Diagnosis Date Noted   Breast cancer ([place]) 01/30/2015   Malignant neoplasm of lower-outer quadrant of right breast of female, estrogen receptor negative ([place]) 03/07/2015   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   Osteoporosis 06/30/2016   Bilateral lower extremity edema 10/14/2016   Elevated LDL cholesterol level 04/18/2019   Routine general medical examination at a health care facility 06/21/2019   Resolved Ambulatory Problems    Diagnosis Date Noted   Dehydration 05/28/2015   Chemotherapy-induced neuropathy ([place]) 11/22/2016   Hemoptysis 08/12/2017   Past Medical History:  Diagnosis Date   Breast cancer of lower-outer quadrant of right female breast ([place]) 02/09/2015   Fainting spell    Malignant neoplasm of lower-outer quadrant of right female breast ([place]) 12/17/2015   Migraines    Personal history of chemotherapy    Personal history of radiation therapy     Family History  Problem Relation Age of Onset   Hypertension Mother    Heart failure Mother    Osteoarthritis Mother     Stroke Mother    Hypertension Father    Diabetes Father    Osteoporosis Sister    Epilepsy Sister    Breast cancer Neg Hx     Social History   Socioeconomic History   Marital status: Married    Spouse name: Not on file   Number of children: Not on file   Years of education: Not on file   Highest education level: Not on file  Occupational History   Not on file  Tobacco Use   Smoking status: Never Smoker   Smokeless tobacco: Never Used  Substance and Sexual Activity   Alcohol use: No    Alcohol/week: 0.0 standard drinks   Drug use: No   Sexual activity: Not on file  Other Topics Concern   Not on file  Social History Narrative   Not on file   Social Determinants of Health   Financial Resource Strain:    Difficulty of Paying Living Expenses:   Food Insecurity:    Worried About Charity fundraiser in the Last Year:    Arboriculturist in the Last Year:   Transportation Needs:    Film/video editor (Medical):    Lack of Transportation (Non-Medical):   Physical Activity:    Days of Exercise per Week:    Minutes of Exercise per Session:   Stress:    Feeling of Stress :   Social Connections:    Frequency of Communication with Friends and Family:    Frequency of Social Gatherings with Friends and Family:  Attends Religious Services:    Active Member of Clubs or Organizations:    Attends Music therapist:    Marital Status:   Intimate Partner Violence:    Fear of Current or Ex-Partner:    Emotionally Abused:    Physically Abused:    Sexually Abused:     ROS  General:  Negative for nexplained weight loss, fever Skin: Negative for new or changing mole, sore that won't heal HEENT: Negative for trouble hearing, trouble seeing, ringing in ears, mouth sores, hoarseness, change in voice, dysphagia. CV:  Negative for chest pain, dyspnea, edema, palpitations Resp: Negative for cough, dyspnea, hemoptysis GI:  Negative for nausea, vomiting, diarrhea, constipation, abdominal pain, melena, hematochezia. GU: Negative for dysuria, incontinence, urinary hesitance, hematuria, vaginal or penile discharge, polyuria, sexual difficulty, lumps in testicle or breasts MSK: Negative for muscle cramps or aches, joint pain or swelling Neuro: Negative for headaches, weakness, numbness, dizziness, passing out/fainting Psych: Negative for depression, anxiety, memory problems  Objective  Physical Exam Vitals:   06/20/20 0834  BP: 110/70  Pulse: 65  Temp: (!) 97.3 F (36.3 C)  SpO2: 98%    BP Readings from Last 3 Encounters:  06/20/20 110/70  12/22/19 101/72  06/20/19 102/68   Wt Readings from Last 3 Encounters:  06/20/20 145 lb 12.8 oz (66.1 kg)  12/22/19 146 lb (66.2 kg)  06/20/19 153 lb 8 oz (69.6 kg)    Physical Exam Constitutional:      General: She is not in acute distress.    Appearance: She is not diaphoretic.  HENT:     Head: Normocephalic and atraumatic.  Eyes:     Conjunctiva/sclera: Conjunctivae normal.     Pupils: Pupils are equal, round, and reactive to light.  Cardiovascular:     Rate and Rhythm: Normal rate and regular rhythm.     Heart sounds: Normal heart sounds.  Pulmonary:     Effort: Pulmonary effort is normal.     Breath sounds: Normal breath sounds.  Abdominal:     General: Bowel sounds are normal. There is no distension.     Palpations: Abdomen is soft.     Tenderness: There is no abdominal tenderness. There is no guarding or rebound.  Musculoskeletal:     Right lower leg: No edema.     Left lower leg: No edema.  Lymphadenopathy:     Cervical: No cervical adenopathy.  Skin:    General: Skin is warm and dry.  Neurological:     Mental Status: She is alert.  Psychiatric:        Mood and Affect: Mood normal.      Assessment/Plan:   Routine general medical examination at a health care facility Physical exam completed.  Encouraged adding in some exercise with  walking a couple days a week.  Discussed healthy diet.  Cancer screening is up-to-date.  Breast exam and pelvic exam deferred given that she just saw GYN for these.  I encouraged her to get the shingles vaccine.  Her other vaccines are up-to-date.  Lab work as outlined below.   Orders Placed This Encounter  Procedures   Vitamin D (25 hydroxy)   Comp Met (CMET)   Lipid panel   CBC   TSH    No orders of the defined types were placed in this encounter.   This visit occurred during the SARS-CoV-2 public health emergency.  Safety protocols were in place, including screening questions prior to the visit, additional usage of staff PPE,  and extensive cleaning of exam room while observing appropriate contact time as indicated for disinfecting solutions.    Wanda Rumps, MD [place]

## 2020-06-20 NOTE — Assessment & Plan Note (Signed)
Physical exam completed.  Encouraged adding in some exercise with walking a couple days a week.  Discussed healthy diet.  Cancer screening is up-to-date.  Breast exam and pelvic exam deferred given that she just saw GYN for these.  I encouraged her to get the shingles vaccine.  Her other vaccines are up-to-date.  Lab work as outlined below.

## 2020-07-03 DIAGNOSIS — Z1159 Encounter for screening for other viral diseases: Secondary | ICD-10-CM | POA: Diagnosis not present

## 2020-07-03 DIAGNOSIS — Z03818 Encounter for observation for suspected exposure to other biological agents ruled out: Secondary | ICD-10-CM | POA: Diagnosis not present

## 2020-08-31 ENCOUNTER — Other Ambulatory Visit: Payer: Self-pay | Admitting: Family Medicine

## 2020-11-07 ENCOUNTER — Other Ambulatory Visit: Payer: Self-pay | Admitting: Family Medicine

## 2020-11-14 ENCOUNTER — Other Ambulatory Visit: Payer: Self-pay | Admitting: Obstetrics & Gynecology

## 2020-11-14 DIAGNOSIS — Z1231 Encounter for screening mammogram for malignant neoplasm of breast: Secondary | ICD-10-CM

## 2020-12-26 ENCOUNTER — Ambulatory Visit
Admission: RE | Admit: 2020-12-26 | Discharge: 2020-12-26 | Disposition: A | Discharge status: Home / Self Care | Payer: BC Managed Care – PPO | Source: Ambulatory Visit | Attending: Obstetrics & Gynecology | Admitting: Obstetrics & Gynecology

## 2020-12-26 ENCOUNTER — Other Ambulatory Visit: Payer: Self-pay

## 2020-12-26 DIAGNOSIS — Z1231 Encounter for screening mammogram for malignant neoplasm of breast: Secondary | ICD-10-CM | POA: Diagnosis not present

## 2021-01-03 DIAGNOSIS — Z853 Personal history of malignant neoplasm of breast: Secondary | ICD-10-CM | POA: Diagnosis not present

## 2021-04-20 ENCOUNTER — Other Ambulatory Visit: Payer: Self-pay | Admitting: Family Medicine

## 2021-05-14 ENCOUNTER — Other Ambulatory Visit: Payer: Self-pay | Admitting: Family Medicine

## 2021-05-17 ENCOUNTER — Other Ambulatory Visit: Payer: Self-pay

## 2021-05-17 ENCOUNTER — Other Ambulatory Visit: Payer: Self-pay | Admitting: *Deleted

## 2021-05-17 ENCOUNTER — Inpatient Hospital Stay: Payer: BC Managed Care – PPO | Attending: Oncology

## 2021-05-17 DIAGNOSIS — Z9221 Personal history of antineoplastic chemotherapy: Secondary | ICD-10-CM | POA: Diagnosis not present

## 2021-05-17 DIAGNOSIS — G629 Polyneuropathy, unspecified: Secondary | ICD-10-CM | POA: Diagnosis not present

## 2021-05-17 DIAGNOSIS — C50511 Malignant neoplasm of lower-outer quadrant of right female breast: Secondary | ICD-10-CM | POA: Insufficient documentation

## 2021-05-17 DIAGNOSIS — Z923 Personal history of irradiation: Secondary | ICD-10-CM | POA: Insufficient documentation

## 2021-05-17 DIAGNOSIS — Z171 Estrogen receptor negative status [ER-]: Secondary | ICD-10-CM | POA: Diagnosis not present

## 2021-05-17 DIAGNOSIS — Z79899 Other long term (current) drug therapy: Secondary | ICD-10-CM | POA: Insufficient documentation

## 2021-05-17 NOTE — Progress Notes (Signed)
29

## 2021-05-18 LAB — CANCER ANTIGEN 27.29: CA 27.29: 19.5 U/mL (ref 0.0–38.6)

## 2021-05-20 ENCOUNTER — Telehealth: Payer: Self-pay | Admitting: Oncology

## 2021-05-20 NOTE — Telephone Encounter (Signed)
Per scheduling message received from [place] on 6/17, pt is requesting her Mychart visit be changed into a in office visit due to cell phone issues. Called pt to notify her of changes with no answer. Left Vm with updated appt details.

## 2021-05-21 ENCOUNTER — Other Ambulatory Visit: Payer: Self-pay

## 2021-05-21 ENCOUNTER — Encounter: Payer: Self-pay | Admitting: Oncology

## 2021-05-21 ENCOUNTER — Inpatient Hospital Stay (HOSPITAL_BASED_OUTPATIENT_CLINIC_OR_DEPARTMENT_OTHER): Payer: BC Managed Care – PPO | Admitting: Oncology

## 2021-05-21 VITALS — BP 104/75 | HR 63 | Temp 97.7°F | Resp 18 | Ht 64.0 in | Wt 149.2 lb

## 2021-05-21 DIAGNOSIS — C50511 Malignant neoplasm of lower-outer quadrant of right female breast: Secondary | ICD-10-CM | POA: Diagnosis not present

## 2021-05-21 DIAGNOSIS — Z923 Personal history of irradiation: Secondary | ICD-10-CM | POA: Diagnosis not present

## 2021-05-21 DIAGNOSIS — G629 Polyneuropathy, unspecified: Secondary | ICD-10-CM | POA: Diagnosis not present

## 2021-05-21 DIAGNOSIS — Z171 Estrogen receptor negative status [ER-]: Secondary | ICD-10-CM

## 2021-05-21 DIAGNOSIS — Z79899 Other long term (current) drug therapy: Secondary | ICD-10-CM | POA: Diagnosis not present

## 2021-05-21 DIAGNOSIS — Z9221 Personal history of antineoplastic chemotherapy: Secondary | ICD-10-CM | POA: Diagnosis not present

## 2021-05-21 NOTE — Progress Notes (Signed)
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  INTERVAL HISTORY: Patient returns to clinic today for routine yearly evaluation.  She continues to have mild neuropathy that is chronic and unchanged and does not affect her day-to-day activity.  She otherwise feels well and is asymptomatic. She has no other neurologic complaints.  She denies any recent fevers or illnesses.  She denies any chest pain, shortness of breath, cough, or hemoptysis.  She has no nausea, vomiting, constipation, or diarrhea.  She has no urinary complaints.  Patient feels at her baseline offers no further specific complaints today.    REVIEW OF SYSTEMS:   Review of Systems  Constitutional: Negative.  Negative for fever, malaise/fatigue and weight loss.  Respiratory: Negative.  Negative for cough, hemoptysis and shortness of breath.   Cardiovascular: Negative.  Negative for chest pain and leg swelling.  Gastrointestinal: Negative.  Negative for abdominal pain.  Genitourinary: Negative.  Negative for dysuria.  Musculoskeletal: Negative.  Negative for myalgias.  Skin: Negative.  Negative for rash.  Neurological:  Positive for sensory change. Negative for dizziness, focal weakness, weakness and headaches.  Psychiatric/Behavioral: Negative.  The patient is not nervous/anxious.   As per HPI. Otherwise, a complete review of systems is negative.   PAST MEDICAL HISTORY: Past Medical History:  Diagnosis Date    Breast cancer of lower-outer quadrant of right female breast ([place]) 02/09/2015   1.7 triple negative, medullary carcinoma. Wide excision, sentinel node biopsy.   Dehydration 05/28/2015   Fainting spell    Malignant neoplasm of lower-outer quadrant of right female breast ([place]) 12/17/2015   Migraines    Personal history of chemotherapy    Personal history of radiation therapy     PAST SURGICAL HISTORY: Past Surgical History:  Procedure Laterality Date   BREAST EXCISIONAL BIOPSY Right 02-21-15   wide excision/sentinel node biopsy.   BREAST LUMPECTOMY Right 2016   internal radiation, chemo    COLONOSCOPY  2013   Dr Vira Agar   FLEXIBLE BRONCHOSCOPY N/A 08/31/2017   Procedure: FLEXIBLE BRONCHOSCOPY;  Surgeon: Laverle Hobby, MD;  Location: [facility] ORS;  Service: Pulmonary;  Laterality: N/A;   PORTACATH PLACEMENT  03-29-15   Dr Bary Castilla    FAMILY HISTORY Family History  Problem Relation Age of Onset   Hypertension Mother    Heart failure Mother    Osteoarthritis Mother    Stroke Mother    Hypertension Father    Diabetes Father    Osteoporosis Sister    Epilepsy Sister    Breast cancer Neg Hx     GYNECOLOGIC HISTORY:  No LMP recorded. (Menstrual status: Chemotherapy).     ADVANCED DIRECTIVES:    HEALTH MAINTENANCE: Social History   Tobacco Use   Smoking status: Never   Smokeless tobacco: Never  Substance Use Topics   Alcohol use: No    Alcohol/week: 0.0 standard drinks   Drug use: No     Colonoscopy:  PAP:  Bone density:  Lipid panel:  No Known Allergies  Current Outpatient Medications  Medication Sig Dispense Refill  b complex vitamins tablet Take 1 tablet by mouth daily.      Biotin 3 MG TABS Take 5,000 mg by mouth daily.      Calcium Carbonate-Vitamin D 600-200 MG-UNIT TABS Take 2 tablets by mouth daily.     cholecalciferol (VITAMIN D3) 25 MCG (1000 UT) tablet Take 1,000 Units by mouth daily.     CINNAMON PO Take 1,000 mg by mouth daily.      ELDERBERRY  PO Take by mouth.     furosemide (LASIX) 40 MG tablet TAKE 1 TABLET BY MOUTH DAILY AS NEEDED FOR EDEMA 90 tablet 1   KLOR-CON M20 20 MEQ tablet TAKE 1 TABLET BY MOUTH EVERY DAY 90 tablet 1   Omega-3 Fatty Acids (OMEGA 3 500) 500 MG CAPS Take 1,000 mg by mouth daily.      Probiotic Product ([employer]S ADV PROBIOTIC GUMMIES PO) Take 2 tablets by mouth daily.      vitamin B-12 (CYANOCOBALAMIN) 1000 MCG tablet Take 1,000 mcg by mouth daily.     vitamin E 1000 UNIT capsule Take 1,000 Units by mouth daily.     No current facility-administered medications for this visit.   Facility-Administered Medications Ordered in Other Visits  Medication Dose Route Frequency Provider Last Rate Last Admin   sodium chloride 0.9 % injection 10 mL  10 mL Intravenous PRN Lloyd Huger, MD   10 mL at 10/30/15 1442    OBJECTIVE: BP 104/75 (BP Location: Left Arm, Patient Position: Sitting, Cuff Size: Normal)   Pulse 63   Temp 97.7 F (36.5 C)   Resp 18   Ht _0  (1.626 m)   Wt 149 lb 3.2 oz (67.7 kg)   SpO2 100%   BMI 25.61 kg/m    Body mass index is 25.61 kg/m.    ECOG FS:0 - Asymptomatic  General: Well-developed, well-nourished, no acute distress. Eyes: Pink conjunctiva, anicteric sclera. HEENT: Normocephalic, moist mucous membranes. Breast: Patient reports a normal another provider recently. Lungs: No audible wheezing or coughing. Heart: Regular rate and rhythm. Abdomen: Soft, nontender, no obvious distention. Musculoskeletal: No edema, cyanosis, or clubbing. Neuro: Alert, answering all questions appropriately. Cranial nerves grossly intact. Skin: No rashes or petechiae noted. Psych: Normal affect.   LAB RESULTS:  No visits with results within 3 Day(s) from this visit.  Latest known visit with results is:  Appointment on 05/17/2021  Component Date Value Ref Range Status   CA 27.29 05/17/2021 19.5  0.0 - 38.6 U/mL Final   Comment: (NOTE) Siemens Centaur Immunochemiluminometric Methodology  (IC[facility]) Values obtained with different assay methods or kits cannot be used interchangeably. Results cannot be interpreted as absolute evidence of the presence or absence of malignant disease. Performed At: [facility] [place], [place] [id number] Rush Farmer MD TD:[id number]      STUDIES: No results found.  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  PLAN:   1. 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 Patient is status post wide excision, MammoSite therapy, chemotherapy with Adriamycin and Cytoxan, as well as 12 weeks of Taxol.  She completed her chemotherapy on September 17, 2015.  Patient's most recent mammogram on December 26, 2020 was reported as BI-RADS 1.  Repeat in January 2023.  Her most recent CA 27-29 continues to be within normal limits at 19.5.  Patient prefers to have all her breast exams by her primary care provider.  Return to clinic in 1 year for routine evaluation.   2. Peripheral neuropathy.  Chronic and unchanged.  Possibly secondary to Taxol chemotherapy. Patient has discontinued Cymbalta and gabapentin. She no longer follows up with neurology.   I spent a total of 20 minutes reviewing chart data, face-to-face evaluation with the patient, counseling and coordination of care as detailed above.    Patient expressed understanding and was in agreement with this plan. She also understands that She can call clinic at any time with any questions, concerns, or complaints.    Breast cancer [facility])   Staging form: Breast, AJCC 7th Edition     Clinical stage from 03/26/2015: Stage IA (T1c, N0, M0) - Signed by Lloyd Huger, MD on 03/26/2015   Lloyd Huger, MD   05/21/2021 2:18 PM

## 2021-06-14 DIAGNOSIS — D2272 Melanocytic nevi of left lower limb, including hip: Secondary | ICD-10-CM | POA: Diagnosis not present

## 2021-06-14 DIAGNOSIS — D225 Melanocytic nevi of trunk: Secondary | ICD-10-CM | POA: Diagnosis not present

## 2021-06-14 DIAGNOSIS — D2262 Melanocytic nevi of left upper limb, including shoulder: Secondary | ICD-10-CM | POA: Diagnosis not present

## 2021-06-14 DIAGNOSIS — D2261 Melanocytic nevi of right upper limb, including shoulder: Secondary | ICD-10-CM | POA: Diagnosis not present

## 2021-06-24 ENCOUNTER — Encounter: Payer: Self-pay | Admitting: Family Medicine

## 2021-06-24 ENCOUNTER — Ambulatory Visit (INDEPENDENT_AMBULATORY_CARE_PROVIDER_SITE_OTHER): Payer: BC Managed Care – PPO | Admitting: Family Medicine

## 2021-06-24 ENCOUNTER — Other Ambulatory Visit: Payer: Self-pay

## 2021-06-24 VITALS — BP 120/80 | HR 68 | Temp 97.8°F | Ht 64.0 in | Wt 148.6 lb

## 2021-06-24 DIAGNOSIS — E7800 Pure hypercholesterolemia, unspecified: Secondary | ICD-10-CM

## 2021-06-24 DIAGNOSIS — Z130 Encounter for screening for diseases of the blood and blood-forming organs and certain disorders involving the immune mechanism: Secondary | ICD-10-CM

## 2021-06-24 DIAGNOSIS — Z0000 Encounter for general adult medical examination without abnormal findings: Secondary | ICD-10-CM

## 2021-06-24 DIAGNOSIS — E559 Vitamin D deficiency, unspecified: Secondary | ICD-10-CM | POA: Diagnosis not present

## 2021-06-24 DIAGNOSIS — R600 Localized edema: Secondary | ICD-10-CM

## 2021-06-24 DIAGNOSIS — Z1329 Encounter for screening for other suspected endocrine disorder: Secondary | ICD-10-CM

## 2021-06-24 LAB — COMPREHENSIVE METABOLIC PANEL
ALT: 13 U/L (ref 0–35)
AST: 27 U/L (ref 0–37)
Albumin: 4.3 g/dL (ref 3.5–5.2)
Alkaline Phosphatase: 86 U/L (ref 39–117)
BUN: 14 mg/dL (ref 6–23)
CO2: 30 mEq/L (ref 19–32)
Calcium: 9.4 mg/dL (ref 8.4–10.5)
Chloride: 99 mEq/L (ref 96–112)
Creatinine, Ser: 0.62 mg/dL (ref 0.40–1.20)
GFR: 96.39 mL/min (ref >60.00)
Glucose, Bld: 74 mg/dL (ref 70–99)
Potassium: 3.9 mEq/L (ref 3.5–5.1)
Sodium: 138 mEq/L (ref 135–145)
Total Bilirubin: 0.9 mg/dL (ref 0.2–1.2)
Total Protein: 7.2 g/dL (ref 6.0–8.3)

## 2021-06-24 LAB — LIPID PANEL
Cholesterol: 217 mg/dL — ABNORMAL HIGH (ref 0–200)
HDL: 73 mg/dL (ref >39.00)
LDL Cholesterol: 125 mg/dL — ABNORMAL HIGH (ref 0–99)
NonHDL: 144.41
Total CHOL/HDL Ratio: 3
Triglycerides: 99 mg/dL (ref 0.0–149.0)
VLDL: 19.8 mg/dL (ref 0.0–40.0)

## 2021-06-24 LAB — CBC
HCT: 42.4 % (ref 36.0–46.0)
Hemoglobin: 14.3 g/dL (ref 12.0–15.0)
MCHC: 33.8 g/dL (ref 30.0–36.0)
MCV: 98.6 fl (ref 78.0–100.0)
Platelets: 170 10*3/uL (ref 150.0–400.0)
RBC: 4.3 Mil/uL (ref 3.87–5.11)
RDW: 13.4 % (ref 11.5–15.5)
WBC: 4 10*3/uL (ref 4.0–10.5)

## 2021-06-24 LAB — VITAMIN D 25 HYDROXY (VIT D DEFICIENCY, FRACTURES): VITD: 60.31 ng/mL (ref 30.00–100.00)

## 2021-06-24 LAB — TSH: TSH: 2.6 u[IU]/mL (ref 0.35–5.50)

## 2021-06-24 NOTE — Assessment & Plan Note (Signed)
Physical exam completed.  Encouraged eating at home.  Discussed continued good activity levels.  She will continue to see her specialists for her history of breast cancer.  Discussed colonoscopy to be completed next year.  If she does not hear from GI she will let us know.  She will see GYN next week for her breast and pelvic exams.  I encouraged her to get the second COVID booster as well as the Shingrix vaccine.  Lab work as outlined.

## 2021-06-24 NOTE — Patient Instructions (Signed)
Nice to see you. Please try to work on your diet and Please eat at home more frequently. Please continue with walking for exercise. Please consider getting the COVID booster and the Shingrix vaccine.

## 2021-06-24 NOTE — Assessment & Plan Note (Signed)
This is a chronic intermittent issue that responds to Lasix.  She has no shortness of breath, orthopnea, or PND.  She had an echo in 2017 after she finished chemotherapy that revealed normal heart function.

## 2021-06-24 NOTE — Progress Notes (Signed)
[name], MD Phone: [phone]  Wanda Lopez is a 60 y.o. female who presents today for CPE.  Diet: Eats a lot at restaurants.  Does eat fruits and salads.  Occasional broccoli. Exercise: Was walking daily until she went to the beach.  She notes she is been walking at least 3 days a week since then. Pap smear: 05/31/2019 with gynecology, sees them next week Colonoscopy: 01/08/2012 with 10-year recall Mammogram: 12/26/2020, has a history of breast cancer, follows with general surgery and oncology Family history-  Colon cancer: No  Breast cancer: Sister  Ovarian cancer: No Menses: Postmenopausal Vaccines-   Flu: Up-to-date  Tetanus: Up-to-date  Shingles: Due  COVID19: Due for second booster  Pneumonia: Not indicated HIV screening: Up-to-date Hep C Screening: Up-to-date Tobacco use: No Alcohol use: No Illicit Drug use: No Dentist: yes Ophthalmology: yes   Active Ambulatory Problems    Diagnosis Date Noted   Breast cancer ([place]) 01/30/2015   Malignant neoplasm of lower-outer quadrant of right breast of female, estrogen receptor negative ([place]) 03/07/2015   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   Osteoporosis 06/30/2016   Bilateral lower extremity edema 10/14/2016   Elevated LDL cholesterol level 04/18/2019   Routine general medical examination at a health care facility 06/21/2019   Resolved Ambulatory Problems    Diagnosis Date Noted   Dehydration 05/28/2015   Chemotherapy-induced neuropathy ([place]) 11/22/2016   Hemoptysis 08/12/2017   Past Medical History:  Diagnosis Date   Breast cancer of lower-outer quadrant of right female breast ([place]) 02/09/2015   Fainting spell    Malignant neoplasm of lower-outer quadrant of right female breast ([place]) 12/17/2015   Migraines    Personal history of chemotherapy    Personal history of radiation therapy     Family History  Problem Relation Age of Onset   Hypertension Mother    Heart failure Mother     Osteoarthritis Mother    Stroke Mother    Hypertension Father    Diabetes Father    Osteoporosis Sister    Epilepsy Sister    Breast cancer Neg Hx     Social History   Socioeconomic History   Marital status: Married    Spouse name: Not on file   Number of children: Not on file   Years of education: Not on file   Highest education level: Not on file  Occupational History   Not on file  Tobacco Use   Smoking status: Never   Smokeless tobacco: Never  Substance and Sexual Activity   Alcohol use: No    Alcohol/week: 0.0 standard drinks   Drug use: No   Sexual activity: Not on file  Other Topics Concern   Not on file  Social History Narrative   Not on file   Social Determinants of Health   Financial Resource Strain: Not on file  Food Insecurity: Not on file  Transportation Needs: Not on file  Physical Activity: Not on file  Stress: Not on file  Social Connections: Not on file  Intimate Partner Violence: Not on file    ROS  General:  Negative for nexplained weight loss, fever Skin: Negative for new or changing mole, sore that won't heal HEENT: Negative for trouble hearing, trouble seeing, ringing in ears, mouth sores, hoarseness, change in voice, dysphagia. CV:  Negative for chest pain, dyspnea, edema, palpitations Resp: Negative for cough, dyspnea, hemoptysis GI: Negative for nausea, vomiting, diarrhea, constipation, abdominal pain, melena, hematochezia. GU: Negative for dysuria, incontinence, urinary hesitance,  hematuria, vaginal or penile discharge, polyuria, sexual difficulty, lumps in testicle or breasts MSK: Negative for muscle cramps or aches, joint pain or swelling Neuro: Negative for headaches, weakness, numbness, dizziness, passing out/fainting Psych: Negative for depression, anxiety, memory problems  Objective  Physical Exam Vitals:   06/24/21 0828  BP: 120/80  Pulse: 68  Temp: 97.8 F (36.6 C)  SpO2: 97%    BP Readings from Last 3 Encounters:   06/24/21 120/80  05/21/21 104/75  06/20/20 110/70   Wt Readings from Last 3 Encounters:  06/24/21 148 lb 9.6 oz (67.4 kg)  05/21/21 149 lb 3.2 oz (67.7 kg)  06/20/20 145 lb 12.8 oz (66.1 kg)    Physical Exam Constitutional:      General: She is not in acute distress.    Appearance: She is not diaphoretic.  HENT:     Head: Normocephalic and atraumatic.  Eyes:     Conjunctiva/sclera: Conjunctivae normal.     Pupils: Pupils are equal, round, and reactive to light.  Cardiovascular:     Rate and Rhythm: Normal rate and regular rhythm.     Heart sounds: Normal heart sounds.  Pulmonary:     Effort: Pulmonary effort is normal.     Breath sounds: Normal breath sounds.  Abdominal:     General: Bowel sounds are normal. There is no distension.     Palpations: Abdomen is soft.     Tenderness: There is no abdominal tenderness. There is no guarding or rebound.  Musculoskeletal:     Right lower leg: No edema.     Left lower leg: No edema.  Lymphadenopathy:     Cervical: No cervical adenopathy.  Skin:    General: Skin is warm and dry.  Neurological:     Mental Status: She is alert.  Psychiatric:        Mood and Affect: Mood normal.     Assessment/Plan:   Problem List Items Addressed This Visit     Bilateral lower extremity edema    This is a chronic intermittent issue that responds to Lasix.  She has no shortness of breath, orthopnea, or PND.  She had an echo in 2017 after she finished chemotherapy that revealed normal heart function.       Elevated LDL cholesterol level   Relevant Orders   Comp Met (CMET)   Lipid panel   Routine general medical examination at a health care facility - Primary    Physical exam completed.  Encouraged eating at home.  Discussed continued good activity levels.  She will continue to see her specialists for her history of breast cancer.  Discussed colonoscopy to be completed next year.  If she does not hear from GI she will let us know.  She will  see GYN next week for her breast and pelvic exams.  I encouraged her to get the second COVID booster as well as the Shingrix vaccine.  Lab work as outlined.       Other Visit Diagnoses     Screening for deficiency anemia       Relevant Orders   CBC   Vitamin D deficiency       Relevant Orders   Vitamin D (25 hydroxy)   Thyroid disorder screen       Relevant Orders   TSH       Return in about 1 year (around 06/24/2022) for CPE.  This visit occurred during the SARS-CoV-2 public health emergency.  Safety protocols were in place, including  screening questions prior to the visit, additional usage of staff PPE, and extensive cleaning of exam room while observing appropriate contact time as indicated for disinfecting solutions.    [name], MD [place]

## 2021-08-23 ENCOUNTER — Other Ambulatory Visit: Payer: Self-pay

## 2021-08-23 DIAGNOSIS — Z01419 Encounter for gynecological examination (general) (routine) without abnormal findings: Secondary | ICD-10-CM | POA: Diagnosis not present

## 2021-08-23 DIAGNOSIS — Z1231 Encounter for screening mammogram for malignant neoplasm of breast: Secondary | ICD-10-CM | POA: Diagnosis not present

## 2021-09-09 DIAGNOSIS — Z20822 Contact with and (suspected) exposure to covid-19: Secondary | ICD-10-CM | POA: Diagnosis not present

## 2021-09-09 DIAGNOSIS — Z03818 Encounter for observation for suspected exposure to other biological agents ruled out: Secondary | ICD-10-CM | POA: Diagnosis not present

## 2021-11-14 ENCOUNTER — Other Ambulatory Visit: Payer: Self-pay | Admitting: Family Medicine

## 2021-12-31 ENCOUNTER — Ambulatory Visit
Admission: RE | Admit: 2021-12-31 | Discharge: 2021-12-31 | Disposition: A | Discharge status: Home / Self Care | Payer: BC Managed Care – PPO | Source: Ambulatory Visit

## 2021-12-31 ENCOUNTER — Other Ambulatory Visit: Payer: Self-pay

## 2021-12-31 DIAGNOSIS — Z1231 Encounter for screening mammogram for malignant neoplasm of breast: Secondary | ICD-10-CM | POA: Insufficient documentation

## 2022-01-07 DIAGNOSIS — Z853 Personal history of malignant neoplasm of breast: Secondary | ICD-10-CM | POA: Diagnosis not present

## 2022-01-12 ENCOUNTER — Other Ambulatory Visit: Payer: Self-pay | Admitting: Family Medicine

## 2022-03-05 ENCOUNTER — Other Ambulatory Visit: Payer: Self-pay | Admitting: General Surgery

## 2022-03-05 NOTE — Progress Notes (Signed)
Subjective:  ?  ? Patient ID: Wanda Lopez is a 61 y.o. female. ?  ?HPI ?  ?The following portions of the patient's history were reviewed and updated as appropriate. ?  ?This an established patient is here today for: office visit. The patient is here today to follow up from a recent mammogram. Patient reports no new breast issues or concerns. ?  ?The patient does report her index finger on her right hand will go numb near the tip and turn pale about once per week for the last 3 months.  ?  ?  ?   ?Chief Complaint  ?Patient presents with  ? Follow-up  ?  ?  ?BP 122/66   Pulse 58   Temp 36.4 ?C (97.6 ?F)   Ht 162.6 cm (5' 4" )   Wt 68.5 kg (151 lb)   LMP  (LMP Unknown)   SpO2 100%   BMI 25.92 kg/m?  ?  ?    ?Past Medical History:  ?Diagnosis Date  ? BRCA negative    ? Breast cancer (CMS-HCC) 01/2015  ?  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 Wide excision, sentinel node biopsy., Mammosite radiation.   ? Dehydration    ? Fainting spell    ? History of cervical dysplasia    ?  CIN-I  ? Migraines    ? Neuropathy    ? Personal history of chemotherapy    ? Personal history of radiation therapy    ? Vitamin D deficiency    ?  ?  ?     ?Past Surgical History:  ?Procedure Laterality Date  ? CERVICAL BIOPSY  W/ LOOP ELECTRODE EXCISION   09/22/1994  ?  CIN-1  ? COLONOSCOPY   01/2012  ? MASTECTOMY PARTIAL / LUMPECTOMY Right 2016  ? Right Breast Biopsy Right 01/2015  ?  Breast Cancer  ? Portacath Placement   03/2015  ? BRONCHOSCOPY   08/2017  ? PORTOCAVAL SHUNT PLACEMENT      ?  for chemo  ?  ?  ?  ?        ?OB History   ?  Gravida  ?2  ? Para  ?2  ? Term  ?2  ? Preterm  ?   ? AB  ?   ? Living  ?2  ?  ?  SAB  ?   ? IAB  ?   ? Ectopic  ?   ? Molar  ?   ? Multiple  ?   ? Live Births  ?2  ?  ?  ?  Obstetric Comments  ?Age at first period 49 ?Age of first pregnancy 41 ?Age menopause 23 ?   ?  ?   ?  ?  ?Social History  ?  ?  ?     ?Socioeconomic History  ? Marital status: Married  ?Occupational History  ?  Occupation: Insurance  ?Tobacco Use  ? Smoking status: Never  ? Smokeless tobacco: Never  ?Vaping Use  ? Vaping Use: Never used  ?Substance and Sexual Activity  ? Alcohol use: No  ? Drug use: No  ? Sexual activity: Not Currently  ?    Partners: Male  ?    Birth control/protection: Post-menopausal  ?Other Topics Concern  ? Would you please tell us about the people who live in your home, your pets, or anything else important to your social life? No  ?  ?  ?  ?No Known Allergies ?  ?Current Medications  ?      ?  Current Outpatient Medications  ?Medication Sig Dispense Refill  ? biotin 10,000 mcg Cap Take 5,000 mcg by mouth        ? calcium carbonate-vitamin D3 (CALTRATE 600+D) 600 mg(1,577m) -200 unit tablet Take 2 tablets by mouth once daily.      ? cholecalciferol (VITAMIN D3) 1,000 unit capsule Take 1,000 Units by mouth once daily      ? cinnamon bark 500 mg capsule Take 1,000 mg by mouth once daily.      ? cyanocobalamin (VITAMIN B12) 1000 MCG tablet Take 1,000 mcg by mouth once daily      ? ELDERBERRY FRUIT ORAL Take by mouth      ? FUROsemide (LASIX) 40 MG tablet Take 20 mg by mouth once daily         ? Lactobac no.41/Bifidobact no.7 (PROBIOTIC-10 ORAL) Take by mouth      ? multivitamin tablet Take 1 tablet by mouth once daily      ? OMEGA-3/DHA/EPA/DPA/FISH OIL (OMEGA-3 2100 ORAL) Take by mouth.      ? potassium chloride (K-DUR,KLOR-CON) 20 MEQ ER tablet Take 20 mEq by mouth once daily.  ?       ? VITAMIN B COMPLEX ORAL Take by mouth.      ? vitamin E 1000 UNIT capsule Take 1,000 Units by mouth 2 (two) times daily.      ? clobetasoL (TEMOVATE) 0.05 % ointment Apply 1 fingertip unit (FTU) nightly 4 weeks and then every other day f or 4 weeks then twice weekly for 4 weeks. Maintenance Twice weekly application. No more than 1 30g tube in every 6 month period. (Patient not taking: Reported on 01/03/2021) 30 g 0  ?  ?No current facility-administered medications for this visit.  ?  ?  ?  ?     ?Family History  ?Problem  Relation Age of Onset  ? Stroke Mother    ? High blood pressure (Hypertension) Mother    ? Heart failure Mother    ? Dementia Mother    ? Osteoarthritis Mother    ? High blood pressure (Hypertension) Father    ? Diabetes Father    ? High blood pressure (Hypertension) Sister    ? Bipolar disorder Sister    ? Epilepsy Sister    ? Thyroid disease Sister    ? Osteoporosis (Thinning of bones) Sister    ? Other Sister    ?      Blepharospasm  ? Breast cancer Sister 661 ?      double mastectomy  ? Thyroid disease Sister    ? Colon cancer Neg Hx    ?  ?  ?  ?Labs and Radiology:  ?  ?January 08, 2012 colonoscopy, screening: ?  ?Exam completed by RGaylyn Cheers MD.  Normal exam.  10-year follow-up recommended. ?  ?  ?December 31, 2021 screening mammogram review: ?  ?These films were independently reviewed.  Postsurgical changes. ?  ?  ?  ?Review of Systems  ?Constitutional: Negative for chills and fever.  ?Respiratory: Negative for cough.   ?  ?  ?   ?Objective:  ? Physical Exam ?Exam conducted with a chaperone present.  ?Constitutional:   ?   Appearance: Normal appearance.  ?Cardiovascular:  ?   Rate and Rhythm: Normal rate and regular rhythm.  ?   Pulses: Normal pulses.  ?   Heart sounds: Normal heart sounds.  ?Pulmonary:  ?   Effort: Pulmonary effort is normal.  ?  Breath sounds: Normal breath sounds.  ?Chest:  ?Breasts: ?   Right: Normal.  ?   Left: Normal.  ? ? ?   Comments: Mild thickening superior to the wide excision site at the 9 o'clock position consistent with prior MammoSite radiation.  Best appreciated supine, normal exam when seated. ?Musculoskeletal:  ?     Arms: ? ?   Cervical back: Neck supple.  ?   Comments: Area from the DIP to the tip of the finger which occasionally has episodes of numbness and pale discoloration, relieved with rubbing.  Normal vascular feeling from the radial arcade.  ?Lymphadenopathy:  ?   Upper Body:  ?   Right upper body: No supraclavicular or axillary adenopathy.  ?   Left upper  body: No supraclavicular or axillary adenopathy.  ?Skin: ?   General: Skin is warm and dry.  ?Neurological:  ?   Mental Status: She is alert and oriented to person, place, and time.  ?Psychiatric:     ?   Mood and Affect: Mood normal.     ?   Behavior: Behavior normal.  ?  ?  ?  ?   ?Assessment:  ?   ?Normal breast exam.  Annual follow-up with PCP or medical oncology. ?  ?Potential vasospasm in the tip of the right index finger.  No evidence of embolic event. ?  ?Candidate for repeat colon cancer screening ?   ?Plan:  ?   ?The patient does not have a history of colon cancer in her family, and no personal history of polyps.  Options for screening include 1) Cologuard versus 2) colonoscopy.  Pros and cons of each reviewed.  Patient will consider her options and notify the office of how she would like to proceed. ?  ?Annual clinical exam and mammograms with medical oncology. ?  ?Encouraged to make use of a pediatric ASA daily in the event there is a component of vasospasm.  No clear indication for formal vascular consult at this time. ?    ?This note is partially prepared by Ledell Noss, CMA acting as a scribe in the presence of Dr. Hervey Ard, MD.  ?  ?The documentation recorded by the scribe accurately reflects the service I personally performed and the decisions made by me.  ?  ?Robert Bellow, MD FACS ?  ?

## 2022-03-25 ENCOUNTER — Encounter: Payer: Self-pay | Admitting: General Surgery

## 2022-03-26 ENCOUNTER — Encounter
Admission: RE | Disposition: A | Discharge status: Home / Self Care | Payer: Self-pay | Source: Home / Self Care | Attending: General Surgery

## 2022-03-26 ENCOUNTER — Encounter: Payer: Self-pay | Admitting: General Surgery

## 2022-03-26 ENCOUNTER — Ambulatory Visit: Payer: BC Managed Care – PPO | Admitting: Certified Registered"

## 2022-03-26 ENCOUNTER — Ambulatory Visit
Admission: RE | Admit: 2022-03-26 | Discharge: 2022-03-26 | Disposition: A | Discharge status: Home / Self Care | Payer: BC Managed Care – PPO | Attending: General Surgery | Admitting: General Surgery

## 2022-03-26 DIAGNOSIS — Z1211 Encounter for screening for malignant neoplasm of colon: Secondary | ICD-10-CM | POA: Insufficient documentation

## 2022-03-26 HISTORY — PX: COLONOSCOPY WITH PROPOFOL: SHX5780

## 2022-03-26 SURGERY — COLONOSCOPY WITH PROPOFOL
Anesthesia: Monitor Anesthesia Care

## 2022-03-26 MED ORDER — BIOTIN 3 MG PO TABS: 3.0000 mg | ORAL_TABLET | Freq: Every day | ORAL | 0 refills | Status: AC

## 2022-03-26 MED ORDER — SODIUM CHLORIDE 0.9 % IV SOLN: INTRAVENOUS | Status: DC

## 2022-03-26 MED ORDER — PROPOFOL 10 MG/ML IV BOLUS
INTRAVENOUS | Status: DC | PRN
  Administered 2022-03-26: 40 mg via INTRAVENOUS
  Administered 2022-03-26: 30 mg via INTRAVENOUS
  Administered 2022-03-26: 40 mg via INTRAVENOUS
  Administered 2022-03-26 (×2): 30 mg via INTRAVENOUS
  Administered 2022-03-26: 100 mg via INTRAVENOUS

## 2022-03-26 NOTE — Transfer of Care (Signed)
Immediate Anesthesia Transfer of Care Note ? ?Patient: Wanda Lopez ? ?Procedure(s) Performed: COLONOSCOPY WITH PROPOFOL ? ?Patient Location: PACU ? ?Anesthesia Type:MAC ? ?Level of Consciousness: awake, alert  and oriented ? ?Airway & Oxygen Therapy: Patient Spontanous Breathing and Patient connected to nasal cannula oxygen ? ?Post-op Assessment: Report given to RN, Post -op Vital signs reviewed and stable and Patient moving all extremities ? ?Post vital signs: Reviewed and stable ? ?Last Vitals:  ?Vitals Value Taken Time  ?BP 107/90 03/26/22 1015  ?Temp    ?Pulse 70 03/26/22 1015  ?Resp 16 03/26/22 1015  ?SpO2 100 % 03/26/22 1015  ?Vitals shown include unvalidated device data. ? ?Last Pain:  ?Vitals:  ? 03/26/22 0907  ?TempSrc: Temporal  ?PainSc: 0-No pain  ?   ? ?  ? ?Complications: No notable events documented. ?

## 2022-03-26 NOTE — Op Note (Signed)
[facility] ?Gastroenterology ?Patient Name: Wanda Lopez ?Procedure Date: 03/26/2022 9:35 AM ?MRN: [id number] ?Account #: [id number] ?Date of Birth: Oct 16, 1961 ?Admit Type: Outpatient ?Age: 61 ?Room: [facility] ENDO ROOM 1 ?Gender: Female ?Note Status: Finalized ?Instrument Name: Peds Colonoscope [id number] ?Procedure:             Colonoscopy ?Indications:           Screening for colorectal malignant neoplasm ?Providers:             Robert Bellow, MD ?Referring MD:          Angela Adam. Caryl Bis (Referring MD) ?Medicines:             Propofol per Anesthesia ?Complications:         No immediate complications. ?Procedure:             Pre-Anesthesia Assessment: ?                       - Prior to the procedure, a History and Physical was  ?                       performed, and patient medications, allergies and  ?                       sensitivities were reviewed. The patient's tolerance  ?                       of previous anesthesia was reviewed. ?                       - The risks and benefits of the procedure and the  ?                       sedation options and risks were discussed with the  ?                       patient. All questions were answered and informed  ?                       consent was obtained. ?                       After obtaining informed consent, the colonoscope was  ?                       passed under direct vision. Throughout the procedure,  ?                       the patient's blood pressure, pulse, and oxygen  ?                       saturations were monitored continuously. The  ?                       Colonoscope was introduced through the anus and  ?                       advanced to the the terminal ileum. The colonoscopy  ?  was performed without difficulty. The patient  ?                       tolerated the procedure well. The quality of the bowel  ?                       preparation was excellent. ?Findings: ?     The entire examined colon appeared  normal on direct and retroflexion  ?     views. ?Impression:            - The entire examined colon is normal on direct and  ?                       retroflexion views. ?                       - No specimens collected. ?Recommendation:        - Repeat colonoscopy in 10 years for screening  ?                       purposes. ?Procedure Code(s):     --- Professional --- ?                       [id number], Colonoscopy, flexible; diagnostic, including  ?                       collection of specimen(s) by brushing or washing, when  ?                       performed (separate procedure) ?Diagnosis Code(s):     --- Professional --- ?                       Z12.11, Encounter for screening for malignant neoplasm  ?                       of colon ?CPT copyright 2019 American Medical Association. All rights reserved. ?The codes documented in this report are preliminary and upon coder review may  ?be revised to meet current compliance requirements. ?Robert Bellow, MD ?03/26/2022 10:10:43 AM ?This report has been signed electronically. ?Number of Addenda: 0 ?Note Initiated On: 03/26/2022 9:35 AM ?Scope Withdrawal Time: 0 hours 9 minutes 20 seconds  ?Total Procedure Duration: 0 hours 16 minutes 57 seconds  ?Estimated Blood Loss:  Estimated blood loss: none. ?     [facility] ?

## 2022-03-26 NOTE — H&P (Signed)
Wanda Lopez ?[id number] ?February 14, 1961 ? ?  ? ?HPI:  61 y/o woman for screening colonoscopy. Tolerated the prep well.  ? ?Medications Prior to Admission  ?Medication Sig Dispense Refill Last Dose  ? b complex vitamins tablet Take 1 tablet by mouth daily.    03/24/2022  ? Biotin 3 MG TABS Take 5,000 mg by mouth daily.    03/24/2022  ? Calcium Carbonate-Vitamin D 600-200 MG-UNIT TABS Take 2 tablets by mouth daily.   03/24/2022  ? cholecalciferol (VITAMIN D3) 25 MCG (1000 UT) tablet Take 1,000 Units by mouth daily.   03/24/2022  ? CINNAMON PO Take 1,000 mg by mouth daily.    03/24/2022  ? ELDERBERRY PO Take by mouth.   03/24/2022  ? furosemide (LASIX) 40 MG tablet TAKE 1 TABLET BY MOUTH DAILY AS NEEDED FOR EDEMA. 90 tablet 1 03/24/2022  ? KLOR-CON M20 20 MEQ tablet TAKE 1 TABLET BY MOUTH EVERY DAY 90 tablet 1 03/24/2022  ? Omega-3 Fatty Acids (OMEGA 3 500) 500 MG CAPS Take 1,000 mg by mouth daily.    03/19/2022  ? Probiotic Product ([employer] ADV PROBIOTIC GUMMIES PO) Take 2 tablets by mouth daily.    03/24/2022  ? vitamin B-12 (CYANOCOBALAMIN) 1000 MCG tablet Take 1,000 mcg by mouth daily.   03/24/2022  ? vitamin E 1000 UNIT capsule Take 1,000 Units by mouth daily.   03/19/2022  ? ?No Known Allergies ?Past Medical History:  ?Diagnosis Date  ? Breast cancer of lower-outer quadrant of right female breast ([place]) 02/09/2015  ? 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 Wide excision, sentinel node biopsy.  ? Dehydration 05/28/2015  ? Fainting spell   ? Malignant neoplasm of lower-outer quadrant of right female breast ([place]) 12/17/2015  ? Migraines   ? Personal history of chemotherapy   ? Personal history of radiation therapy   ? ?Past Surgical History:  ?Procedure Laterality Date  ? BREAST EXCISIONAL BIOPSY Right 02-21-15  ? wide excision/sentinel node biopsy.  ? BREAST LUMPECTOMY Right 2016  ? internal radiation, chemo   ? COLONOSCOPY  2013  ? Dr Vira Agar  ? FLEXIBLE BRONCHOSCOPY N/A 08/31/2017  ? Procedure: FLEXIBLE BRONCHOSCOPY;  Surgeon:  Laverle Hobby, MD;  Location: [facility] ORS;  Service: Pulmonary;  Laterality: N/A;  ? PORTACATH PLACEMENT  03-29-15  ? Dr Bary Castilla  ? ?Social History  ? ?Socioeconomic History  ? Marital status: Married  ?  Spouse name: Not on file  ? Number of children: Not on file  ? Years of education: Not on file  ? Highest education level: Not on file  ?Occupational History  ? Not on file  ?Tobacco Use  ? Smoking status: Never  ? Smokeless tobacco: Never  ?Vaping Use  ? Vaping Use: Never used  ?Substance and Sexual Activity  ? Alcohol use: No  ?  Alcohol/week: 0.0 standard drinks  ? Drug use: No  ? Sexual activity: Not on file  ?Other Topics Concern  ? Not on file  ?Social History Narrative  ? Not on file  ? ?Social Determinants of Health  ? ?Financial Resource Strain: Not on file  ?Food Insecurity: Not on file  ?Transportation Needs: Not on file  ?Physical Activity: Not on file  ?Stress: Not on file  ?Social Connections: Not on file  ?Intimate Partner Violence: Not on file  ? ?Social History  ? ?Social History Narrative  ? Not on file  ? ? ? ?ROS: Negative.  ? ? ? ?PE: ?HEENT: Negative. ?Lungs: Clear. ?Cardio: RR. ? ? ?  Assessment/Plan: ? ?Proceed with planned endoscopy.  ?Robert Bellow ?03/26/2022 ? ?  ?

## 2022-03-26 NOTE — Anesthesia Postprocedure Evaluation (Signed)
Anesthesia Post Note ? ?Patient: Wanda Lopez ? ?Procedure(s) Performed: COLONOSCOPY WITH PROPOFOL ? ?Patient location during evaluation: Endoscopy ?Anesthesia Type: MAC ?Level of consciousness: awake and alert ?Pain management: pain level controlled ?Vital Signs Assessment: post-procedure vital signs reviewed and stable ?Respiratory status: spontaneous breathing, nonlabored ventilation, respiratory function stable and patient connected to nasal cannula oxygen ?Cardiovascular status: blood pressure returned to baseline and stable ?Postop Assessment: no apparent nausea or vomiting ?Anesthetic complications: no ? ? ?No notable events documented. ? ? ?Last Vitals:  ?Vitals:  ? 03/26/22 1015 03/26/22 1033  ?BP: 107/90 112/82  ?Pulse: 69   ?Resp: 12   ?Temp:    ?SpO2: 99%   ?  ?Last Pain:  ?Vitals:  ? 03/26/22 1033  ?TempSrc:   ?PainSc: 0-No pain  ? ? ?  ?  ?  ?  ?  ?  ? ?Arita Miss ? ? ? ? ?

## 2022-03-26 NOTE — Anesthesia Preprocedure Evaluation (Signed)
Anesthesia Evaluation  ?Patient identified by MRN, date of birth, ID band ?Patient awake ? ? ? ?Reviewed: ?Allergy & Precautions, NPO status , Patient's Chart, lab work & pertinent test results ? ?History of Anesthesia Complications ?Negative for: history of anesthetic complications ? ?Airway ?Mallampati: II ? ?TM Distance: >3 FB ?Neck ROM: Full ? ? ? Dental ?no notable dental hx. ?(+) Teeth Intact ?  ?Pulmonary ?neg pulmonary ROS, neg sleep apnea, neg COPD, Patient abstained from smoking.Not current smoker,  ?  ?Pulmonary exam normal ?breath sounds clear to auscultation ? ? ? ? ? ? Cardiovascular ?Exercise Tolerance: Good ?METS(-) hypertension(-) CAD and (-) Past MI negative cardio ROS ? ?(-) dysrhythmias  ?Rhythm:Regular Rate:Normal ?- Systolic murmurs ? ?  ?Neuro/Psych ? Headaches, negative psych ROS  ? GI/Hepatic ?neg GERD  ,(+)  ?  ? (-) substance abuse ? ,   ?Endo/Other  ?neg diabetes ? Renal/GU ?negative Renal ROS  ? ?  ?Musculoskeletal ? ? Abdominal ?  ?Peds ? Hematology ?  ?Anesthesia Other Findings ?Past Medical History: ?02/09/2015: Breast cancer of lower-outer quadrant of right female  ?breast ([place]) ?    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 Wide excision, ?             sentinel node biopsy. ?05/28/2015: Dehydration ?No date: Fainting spell ?12/17/2015: Malignant neoplasm of lower-outer quadrant of right female  ?breast ([place]) ?No date: Migraines ?No date: Personal history of chemotherapy ?No date: Personal history of radiation therapy ? Reproductive/Obstetrics ? ?  ? ? ? ? ? ? ? ? ? ? ? ? ? ?  ?  ? ? ? ? ? ? ? ? ?Anesthesia Physical ?Anesthesia Plan ? ?ASA: 2 ? ?Anesthesia Plan: General  ? ?Post-op Pain Management: Minimal or no pain anticipated  ? ?Induction: Intravenous ? ?PONV Risk Score and Plan: 3 and Propofol infusion, TIVA and Ondansetron ? ?Airway Management Planned: Nasal Cannula ? ?Additional Equipment: None ? ?Intra-op Plan:  ? ?Post-operative  Plan:  ? ?Informed Consent: I have reviewed the patients History and Physical, chart, labs and discussed the procedure including the risks, benefits and alternatives for the proposed anesthesia with the patient or authorized representative who has indicated his/her understanding and acceptance.  ? ? ? ?Dental advisory given ? ?Plan Discussed with: CRNA and Surgeon ? ?Anesthesia Plan Comments: (Discussed risks of anesthesia with patient, including possibility of difficulty with spontaneous ventilation under anesthesia necessitating airway intervention, PONV, and rare risks such as cardiac or respiratory or neurological events, and allergic reactions. Discussed the role of CRNA in patient's perioperative care. Patient understands.)  ? ? ? ? ? ? ?Anesthesia Quick Evaluation ? ?

## 2022-03-27 ENCOUNTER — Encounter: Payer: Self-pay | Admitting: General Surgery

## 2022-05-15 ENCOUNTER — Other Ambulatory Visit: Payer: Self-pay

## 2022-05-15 ENCOUNTER — Inpatient Hospital Stay: Payer: BC Managed Care – PPO | Attending: Oncology

## 2022-05-15 DIAGNOSIS — Z171 Estrogen receptor negative status [ER-]: Secondary | ICD-10-CM | POA: Diagnosis not present

## 2022-05-15 DIAGNOSIS — Z79899 Other long term (current) drug therapy: Secondary | ICD-10-CM | POA: Insufficient documentation

## 2022-05-15 DIAGNOSIS — Z923 Personal history of irradiation: Secondary | ICD-10-CM | POA: Insufficient documentation

## 2022-05-15 DIAGNOSIS — G629 Polyneuropathy, unspecified: Secondary | ICD-10-CM | POA: Insufficient documentation

## 2022-05-15 DIAGNOSIS — C50511 Malignant neoplasm of lower-outer quadrant of right female breast: Secondary | ICD-10-CM | POA: Insufficient documentation

## 2022-05-16 LAB — CANCER ANTIGEN 27.29: CA 27.29: 22.8 U/mL (ref 0.0–38.6)

## 2022-05-19 ENCOUNTER — Inpatient Hospital Stay (HOSPITAL_BASED_OUTPATIENT_CLINIC_OR_DEPARTMENT_OTHER): Payer: BC Managed Care – PPO | Admitting: Medical Oncology

## 2022-05-19 VITALS — BP 102/67 | HR 78 | Temp 97.5°F | Resp 16 | Wt 152.0 lb

## 2022-05-19 DIAGNOSIS — C50511 Malignant neoplasm of lower-outer quadrant of right female breast: Secondary | ICD-10-CM | POA: Diagnosis not present

## 2022-05-19 DIAGNOSIS — Z923 Personal history of irradiation: Secondary | ICD-10-CM | POA: Diagnosis not present

## 2022-05-19 DIAGNOSIS — Z171 Estrogen receptor negative status [ER-]: Secondary | ICD-10-CM | POA: Diagnosis not present

## 2022-05-19 DIAGNOSIS — Z79899 Other long term (current) drug therapy: Secondary | ICD-10-CM | POA: Diagnosis not present

## 2022-05-19 DIAGNOSIS — G629 Polyneuropathy, unspecified: Secondary | ICD-10-CM | POA: Diagnosis not present

## 2022-05-19 NOTE — Progress Notes (Signed)
Returns for 1 year follow-up. No new concerns or changes in medical history at this time.

## 2022-05-19 NOTE — Progress Notes (Signed)
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  INTERVAL HISTORY: Ms. Wanda Lopez is here today for follow up. She is seen by her OB-GYN, Education officer, environmental, and PCP all of whom complete breast exams so she declines a breast exam today. She otherwise feels well and is asymptomatic. She has no other neurologic complaints.  She denies any recent fevers or illnesses.  She denies any chest pain, shortness of breath, cough, or hemoptysis.  She has no nausea, vomiting, constipation, or diarrhea.  She has no urinary complaints.  Patient feels at her baseline offers no further specific complaints today.    REVIEW OF SYSTEMS:   Review of Systems  Constitutional: Negative.  Negative for fever, malaise/fatigue and weight loss.  Respiratory: Negative.  Negative for cough, hemoptysis and shortness of breath.   Cardiovascular: Negative.  Negative for chest pain and leg swelling.  Gastrointestinal: Negative.  Negative for abdominal pain.  Genitourinary: Negative.  Negative for dysuria.  Musculoskeletal: Negative.  Negative for myalgias.  Skin: Negative.  Negative for rash.  Neurological:  Positive for sensory change. Negative for dizziness, focal weakness, weakness and headaches.  Psychiatric/Behavioral: Negative.  The patient is not nervous/anxious.    As per HPI. Otherwise, a complete review of systems is negative.   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 Wide excision, sentinel node biopsy.   Dehydration 05/28/2015   Fainting spell    Malignant neoplasm of lower-outer quadrant of right female breast ([place]) 12/17/2015   Migraines    Personal history of chemotherapy    Personal history of radiation therapy     PAST SURGICAL HISTORY: Past Surgical History:  Procedure Laterality Date   BREAST EXCISIONAL BIOPSY Right 02-21-15   wide excision/sentinel node biopsy.   BREAST LUMPECTOMY Right 2016   internal radiation, chemo    COLONOSCOPY  2013   Dr Vira Agar   COLONOSCOPY WITH PROPOFOL N/A 03/26/2022   Procedure: COLONOSCOPY WITH PROPOFOL;  Surgeon: Robert Bellow, MD;  Location[facility] ENDOSCOPY;  Service: Endoscopy;  Laterality: N/A;   FLEXIBLE BRONCHOSCOPY N/A 08/31/2017   Procedure: FLEXIBLE BRONCHOSCOPY;  Surgeon: Laverle Hobby, MD;  Location: [facility] ORS;  Service: Pulmonary;  Laterality: N/A;   PORTACATH PLACEMENT  03-29-15   Dr Wanda Lopez    FAMILY HISTORY Family History  Problem Relation Age of Onset   Hypertension Mother    Heart failure Mother    Osteoarthritis Mother    Stroke Mother    Hypertension Father    Diabetes Father    Osteoporosis Sister    Epilepsy Sister    Breast cancer Neg Hx     GYNECOLOGIC HISTORY:  No LMP recorded. (Menstrual status: Chemotherapy).     ADVANCED DIRECTIVES:    HEALTH MAINTENANCE: Social History   Tobacco Use   Smoking status: Never   Smokeless tobacco: Never  Vaping Use   Vaping Use: Never used  Substance Use Topics   Alcohol  use: No    Alcohol/week: 0.0 standard drinks of alcohol   Drug use: No     Colonoscopy:  PAP:  Bone density:  Lipid panel:  No Known Allergies  Current Outpatient Medications  Medication Sig Dispense Refill   aspirin EC 81 MG tablet Take 81 mg by mouth daily. Swallow whole.     b complex vitamins tablet Take 1 tablet by mouth daily.       Biotin 3 MG TABS Take 1 tablet (3 mg total) by mouth daily. 1 tablet 0   Calcium Carbonate-Vitamin D 600-200 MG-UNIT TABS Take 2 tablets by mouth daily.     cholecalciferol (VITAMIN D3) 25 MCG (1000 UT) tablet Take 1,000 Units by mouth daily.     CINNAMON PO Take 1,000 mg by mouth daily.      ELDERBERRY PO Take by mouth.     furosemide (LASIX) 40 MG tablet TAKE 1 TABLET BY MOUTH DAILY AS NEEDED FOR EDEMA. 90 tablet 1   KLOR-CON M20 20 MEQ tablet TAKE 1 TABLET BY MOUTH EVERY DAY 90 tablet 1   Omega-3 Fatty Acids (OMEGA 3 500) 500 MG CAPS Take 1,000 mg by mouth daily.      Probiotic Product ([employer] ADV PROBIOTIC GUMMIES PO) Take 2 tablets by mouth daily.      vitamin B-12 (CYANOCOBALAMIN) 1000 MCG tablet Take 1,000 mcg by mouth daily.     vitamin E 1000 UNIT capsule Take 1,000 Units by mouth daily.     No current facility-administered medications for this visit.   Facility-Administered Medications Ordered in Other Visits  Medication Dose Route Frequency Provider Last Rate Last Admin   sodium chloride 0.9 % injection 10 mL  10 mL Intravenous PRN Lloyd Huger, MD   10 mL at 10/30/15 1442    OBJECTIVE: BP 102/67   Pulse 78   Temp (!) 97.5 F (36.4 C) (Tympanic)   Resp 16   Wt 152 lb (68.9 kg)   BMI 26.09 kg/m    Body mass index is 26.09 kg/m.    ECOG FS:0 - Asymptomatic  General: Well-developed, well-nourished, no acute distress. Eyes: Pink conjunctiva, anicteric sclera. HEENT: Normocephalic, moist mucous membranes. Breast: Deferred  Lungs: No audible wheezing or coughing. Heart: Regular rate and rhythm. Abdomen: Soft, nontender, no obvious distention. Musculoskeletal: No edema, cyanosis, or clubbing. Neuro: Alert, answering all questions appropriately. Cranial nerves grossly intact. Skin: No rashes or petechiae noted. Psych: Normal affect.   LAB RESULTS:  No visits with results within 3 Day(s) from this visit.  Latest known visit with results is:  Orders Only on  05/15/2022  Component Date Value Ref Range Status   CA 27.29 05/15/2022 22.8  0.0 - 38.6 U/mL Final   Comment: (NOTE) Siemens Centaur Immunochemiluminometric Methodology (ICMA) Values obtained with different assay methods or kits cannot be used interchangeably. Results cannot be interpreted as absolute evidence of the presence or absence of malignant disease. Performed At: [facility] [place], [place] [id number] Rush Farmer MD JE:[id number]      STUDIES: No results found.  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  PLAN:   1. 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 Patient is status post wide excision, MammoSite therapy, chemotherapy with Adriamycin and Cytoxan, as well as 12 weeks of Taxol.  She completed her chemotherapy on September 17, 2015.  Patient's most recent mammogram on December 31, 2021  was reported as BI-RADS 1.  Repeat in January 2024.  Her most recent CA 27-29 continues to be within normal limits at 22.8.  Patient prefers to have all her breast exams by her primary care provider.  Return to clinic in 1 year for routine evaluation.   2. Peripheral neuropathy.  Chronic and unchanged.   I spent a total of 20 minutes reviewing chart data, face-to-face evaluation with the patient, counseling and coordination of care as detailed above.   Patient expressed understanding and was in agreement with this plan. She also understands that She can call clinic at any time with any questions, concerns, or complaints.    Breast cancer [facility])   Staging form: Breast, AJCC 7th Edition     Clinical stage from 03/26/2015: Stage IA (T1c, N0, M0) - Signed by Lloyd Huger, MD on 03/26/2015   Hughie Closs, PA-C   05/19/2022 2:50 PM

## 2022-06-18 DIAGNOSIS — D2261 Melanocytic nevi of right upper limb, including shoulder: Secondary | ICD-10-CM | POA: Diagnosis not present

## 2022-06-18 DIAGNOSIS — D2262 Melanocytic nevi of left upper limb, including shoulder: Secondary | ICD-10-CM | POA: Diagnosis not present

## 2022-06-18 DIAGNOSIS — D2272 Melanocytic nevi of left lower limb, including hip: Secondary | ICD-10-CM | POA: Diagnosis not present

## 2022-06-18 DIAGNOSIS — D225 Melanocytic nevi of trunk: Secondary | ICD-10-CM | POA: Diagnosis not present

## 2022-06-25 ENCOUNTER — Encounter: Payer: BC Managed Care – PPO | Admitting: Family Medicine

## 2022-07-02 ENCOUNTER — Encounter: Payer: Self-pay | Admitting: Family Medicine

## 2022-07-02 ENCOUNTER — Ambulatory Visit (INDEPENDENT_AMBULATORY_CARE_PROVIDER_SITE_OTHER): Payer: BC Managed Care – PPO | Admitting: Family Medicine

## 2022-07-02 VITALS — BP 110/60 | HR 63 | Temp 97.6°F | Ht 63.5 in | Wt 155.4 lb

## 2022-07-02 DIAGNOSIS — Z0000 Encounter for general adult medical examination without abnormal findings: Secondary | ICD-10-CM

## 2022-07-02 DIAGNOSIS — G620 Drug-induced polyneuropathy: Secondary | ICD-10-CM

## 2022-07-02 DIAGNOSIS — E559 Vitamin D deficiency, unspecified: Secondary | ICD-10-CM | POA: Diagnosis not present

## 2022-07-02 DIAGNOSIS — Z130 Encounter for screening for diseases of the blood and blood-forming organs and certain disorders involving the immune mechanism: Secondary | ICD-10-CM | POA: Diagnosis not present

## 2022-07-02 DIAGNOSIS — Z1329 Encounter for screening for other suspected endocrine disorder: Secondary | ICD-10-CM | POA: Diagnosis not present

## 2022-07-02 DIAGNOSIS — T451X5A Adverse effect of antineoplastic and immunosuppressive drugs, initial encounter: Secondary | ICD-10-CM

## 2022-07-02 DIAGNOSIS — E7800 Pure hypercholesterolemia, unspecified: Secondary | ICD-10-CM | POA: Diagnosis not present

## 2022-07-02 LAB — COMPREHENSIVE METABOLIC PANEL
ALT: 17 U/L (ref 0–35)
AST: 24 U/L (ref 0–37)
Albumin: 4.2 g/dL (ref 3.5–5.2)
Alkaline Phosphatase: 84 U/L (ref 39–117)
BUN: 17 mg/dL (ref 6–23)
CO2: 30 mEq/L (ref 19–32)
Calcium: 9.4 mg/dL (ref 8.4–10.5)
Chloride: 98 mEq/L (ref 96–112)
Creatinine, Ser: 0.66 mg/dL (ref 0.40–1.20)
GFR: 94.27 mL/min (ref >60.00)
Glucose, Bld: 83 mg/dL (ref 70–99)
Potassium: 4.5 mEq/L (ref 3.5–5.1)
Sodium: 136 mEq/L (ref 135–145)
Total Bilirubin: 0.7 mg/dL (ref 0.2–1.2)
Total Protein: 7.2 g/dL (ref 6.0–8.3)

## 2022-07-02 LAB — LIPID PANEL
Cholesterol: 208 mg/dL — ABNORMAL HIGH (ref 0–200)
HDL: 63.8 mg/dL (ref >39.00)
LDL Cholesterol: 120 mg/dL — ABNORMAL HIGH (ref 0–99)
NonHDL: 143.75
Total CHOL/HDL Ratio: 3
Triglycerides: 118 mg/dL (ref 0.0–149.0)
VLDL: 23.6 mg/dL (ref 0.0–40.0)

## 2022-07-02 LAB — CBC
HCT: 40.6 % (ref 36.0–46.0)
Hemoglobin: 13.8 g/dL (ref 12.0–15.0)
MCHC: 34 g/dL (ref 30.0–36.0)
MCV: 98.5 fl (ref 78.0–100.0)
Platelets: 201 10*3/uL (ref 150.0–400.0)
RBC: 4.12 Mil/uL (ref 3.87–5.11)
RDW: 14 % (ref 11.5–15.5)
WBC: 4.8 10*3/uL (ref 4.0–10.5)

## 2022-07-02 LAB — TSH: TSH: 2.08 u[IU]/mL (ref 0.35–5.50)

## 2022-07-02 LAB — VITAMIN D 25 HYDROXY (VIT D DEFICIENCY, FRACTURES): VITD: 38 ng/mL (ref 30.00–100.00)

## 2022-07-02 NOTE — Progress Notes (Signed)
[name], MD Phone: [phone]  Wanda Lopez is a 61 y.o. female who presents today for CPE.  Diet: "not good", does not eat breakfast, has a snack at work of banana and crackers, eats fast food for lunch, occasionally does not eat dinner and will have fruit or popcorn, has half and half tea 2/day, drinks some diet soda most days Exercise: walks at least 2 miles at least 2-3x/week Pap smear: due with GYN Colonoscopy: 03/26/22 10 year recall Mammogram: 12/31/21 negative, follows with oncology for her breast cancer history Family history-  Colon cancer: no  Breast cancer: sister  Ovarian cancer: no Menses: postmenopausal Vaccines-   Flu: due  Tetanus: UTD  Shingles: due  COVID19: UTD HIV screening: UTD Hep C Screening: UTD Tobacco use: no Alcohol use: no Illicit Drug use: no Dentist: yes Ophthalmology: yes   Active Ambulatory Problems    Diagnosis Date Noted   Breast cancer ([place]) 01/30/2015   Malignant neoplasm of lower-outer quadrant of right breast of female, estrogen receptor negative ([place]) 03/07/2015   Neuropathy due to chemotherapeutic drug ([place]) 06/30/2016   Osteoporosis 06/30/2016   Bilateral lower extremity edema 10/14/2016   Elevated LDL cholesterol level 04/18/2019   Routine general medical examination at a health care facility 06/21/2019   Resolved Ambulatory Problems    Diagnosis Date Noted   Dehydration 05/28/2015   Chemotherapy-induced neuropathy ([place]) 11/22/2016   Hemoptysis 08/12/2017   Past Medical History:  Diagnosis Date   Breast cancer of lower-outer quadrant of right female breast ([place]) 02/09/2015   Fainting spell    Malignant neoplasm of lower-outer quadrant of right female breast ([place]) 12/17/2015   Migraines    Personal history of chemotherapy    Personal history of radiation therapy     Family History  Problem Relation Age of Onset   Hypertension Mother    Heart failure Mother    Osteoarthritis Mother    Stroke Mother     Hypertension Father    Diabetes Father    Osteoporosis Sister    Epilepsy Sister    Breast cancer Neg Hx     Social History   Socioeconomic History   Marital status: Married    Spouse name: Not on file   Number of children: Not on file   Years of education: Not on file   Highest education level: Not on file  Occupational History   Not on file  Tobacco Use   Smoking status: Never   Smokeless tobacco: Never  Vaping Use   Vaping Use: Never used  Substance and Sexual Activity   Alcohol use: No    Alcohol/week: 0.0 standard drinks of alcohol   Drug use: No   Sexual activity: Not on file  Other Topics Concern   Not on file  Social History Narrative   Not on file   Social Determinants of Health   Financial Resource Strain: Not on file  Food Insecurity: Not on file  Transportation Needs: Not on file  Physical Activity: Not on file  Stress: Not on file  Social Connections: Not on file  Intimate Partner Violence: Not on file    ROS  General:  Negative for nexplained weight loss, fever Skin: Negative for new or changing mole, sore that won't heal HEENT: Negative for trouble hearing, trouble seeing, ringing in ears, mouth sores, hoarseness, change in voice, dysphagia. CV:  Negative for chest pain, dyspnea, edema, palpitations Resp: Negative for cough, dyspnea, hemoptysis GI: Negative for nausea, vomiting, diarrhea, constipation, abdominal  pain, melena, hematochezia. GU: Negative for dysuria, incontinence, urinary hesitance, hematuria, vaginal or penile discharge, polyuria, sexual difficulty, lumps in testicle or breasts MSK: Negative for muscle cramps or aches, joint pain or swelling Neuro: Positive for numbness (chronic), negative for headaches, weakness, dizziness, passing out/fainting Psych: Negative for depression, anxiety, memory problems  Objective  Physical Exam Vitals:   07/02/22 0907  BP: 110/60  Pulse: 63  Temp: 97.6 F (36.4 C)  SpO2: 99%    BP  Readings from Last 3 Encounters:  07/02/22 110/60  05/19/22 102/67  03/26/22 112/82   Wt Readings from Last 3 Encounters:  07/02/22 155 lb 6.4 oz (70.5 kg)  05/19/22 152 lb (68.9 kg)  03/26/22 145 lb (65.8 kg)    Physical Exam Constitutional:      General: She is not in acute distress.    Appearance: She is not diaphoretic.  HENT:     Head: Normocephalic and atraumatic.  Cardiovascular:     Rate and Rhythm: Normal rate and regular rhythm.     Heart sounds: Normal heart sounds.  Pulmonary:     Effort: Pulmonary effort is normal.     Breath sounds: Normal breath sounds.  Abdominal:     General: Bowel sounds are normal. There is no distension.     Palpations: Abdomen is soft.     Tenderness: There is no abdominal tenderness.  Musculoskeletal:     Right lower leg: No edema.     Left lower leg: No edema.  Lymphadenopathy:     Cervical: No cervical adenopathy.  Skin:    General: Skin is warm and dry.  Neurological:     Mental Status: She is alert.  Psychiatric:        Mood and Affect: Mood normal.      Assessment/Plan:   Problem List Items Addressed This Visit     Elevated LDL cholesterol level   Relevant Orders   Comp Met (CMET)   Lipid panel   Neuropathy due to chemotherapeutic drug (HCC)    Chronic and stable.      Routine general medical examination at a health care facility - Primary    Physical exam completed.  I encouraged continued exercise.  Discussed healthier diet with reducing fast food intake and sugary drink intake.  Mammogram and colonoscopy are up-to-date.  She will have her Pap smear completed by GYN.  Discussed getting a flu vaccine in the next several months.  Discussed getting the Shingrix vaccine.  The patient was concerned that she would be able to pass chickenpox to her grandchild though I discussed that the Shingrix vaccine is not a live virus vaccine and there is no risk of passing chickenpox to the child after the Shingrix vaccine.  She will  check on cost with her insurance.  Lab work as outlined.      Other Visit Diagnoses     Vitamin D deficiency       Relevant Orders   Vitamin D (25 hydroxy)   Screening for deficiency anemia       Relevant Orders   CBC   Thyroid disorder screen       Relevant Orders   TSH       Return in about 1 year (around 07/03/2023) for CPE.   [name], MD [place]

## 2022-07-02 NOTE — Assessment & Plan Note (Signed)
Physical exam completed.  I encouraged continued exercise.  Discussed healthier diet with reducing fast food intake and sugary drink intake.  Mammogram and colonoscopy are up-to-date.  She will have her Pap smear completed by GYN.  Discussed getting a flu vaccine in the next several months.  Discussed getting the Shingrix vaccine.  The patient was concerned that she would be able to pass chickenpox to her grandchild though I discussed that the Shingrix vaccine is not a live virus vaccine and there is no risk of passing chickenpox to the child after the Shingrix vaccine.  She will check on cost with her insurance.  Lab work as outlined.

## 2022-07-02 NOTE — Assessment & Plan Note (Signed)
Chronic and stable.   

## 2022-07-02 NOTE — Patient Instructions (Signed)
Nice to see you. We will get lab work today and contact her with results. Please try to reduce your fast food intake and sugary drink intake. You can get the Shingrix vaccine through our office or through the pharmacy.

## 2022-07-09 ENCOUNTER — Other Ambulatory Visit: Payer: Self-pay | Admitting: Family Medicine

## 2022-08-26 DIAGNOSIS — Z01419 Encounter for gynecological examination (general) (routine) without abnormal findings: Secondary | ICD-10-CM | POA: Diagnosis not present

## 2022-08-26 DIAGNOSIS — Z1231 Encounter for screening mammogram for malignant neoplasm of breast: Secondary | ICD-10-CM | POA: Diagnosis not present

## 2022-08-26 DIAGNOSIS — Z124 Encounter for screening for malignant neoplasm of cervix: Secondary | ICD-10-CM | POA: Diagnosis not present

## 2022-08-26 DIAGNOSIS — Z1151 Encounter for screening for human papillomavirus (HPV): Secondary | ICD-10-CM | POA: Diagnosis not present

## 2022-12-03 ENCOUNTER — Other Ambulatory Visit: Payer: Self-pay

## 2022-12-03 DIAGNOSIS — Z1231 Encounter for screening mammogram for malignant neoplasm of breast: Secondary | ICD-10-CM

## 2022-12-28 ENCOUNTER — Telehealth: Payer: Self-pay | Admitting: Family

## 2023-01-01 ENCOUNTER — Ambulatory Visit
Admission: RE | Admit: 2023-01-01 | Discharge: 2023-01-01 | Disposition: A | Discharge status: Home / Self Care | Payer: BC Managed Care – PPO | Source: Ambulatory Visit

## 2023-01-01 DIAGNOSIS — Z1231 Encounter for screening mammogram for malignant neoplasm of breast: Secondary | ICD-10-CM | POA: Diagnosis not present

## 2023-01-05 ENCOUNTER — Other Ambulatory Visit: Payer: Self-pay

## 2023-01-05 DIAGNOSIS — R600 Localized edema: Secondary | ICD-10-CM

## 2023-01-05 MED ORDER — FUROSEMIDE 40 MG PO TABS: ORAL_TABLET | ORAL | 1 refills | Status: DC

## 2023-01-05 NOTE — Telephone Encounter (Signed)
Medication sent to patients pharmacy.  Addison Freimuth,cma

## 2023-01-05 NOTE — Telephone Encounter (Signed)
Pt need a refill on furosemide sent to [facility]

## 2023-03-04 DIAGNOSIS — L309 Dermatitis, unspecified: Secondary | ICD-10-CM | POA: Diagnosis not present

## 2023-03-04 DIAGNOSIS — L659 Nonscarring hair loss, unspecified: Secondary | ICD-10-CM | POA: Diagnosis not present

## 2023-03-05 DIAGNOSIS — N898 Other specified noninflammatory disorders of vagina: Secondary | ICD-10-CM | POA: Diagnosis not present

## 2023-03-05 DIAGNOSIS — N950 Postmenopausal bleeding: Secondary | ICD-10-CM | POA: Diagnosis not present

## 2023-03-19 ENCOUNTER — Other Ambulatory Visit: Payer: Self-pay | Admitting: Family

## 2023-03-20 DIAGNOSIS — H04123 Dry eye syndrome of bilateral lacrimal glands: Secondary | ICD-10-CM | POA: Diagnosis not present

## 2023-04-01 ENCOUNTER — Telehealth: Payer: Self-pay | Admitting: Family Medicine

## 2023-04-01 DIAGNOSIS — N950 Postmenopausal bleeding: Secondary | ICD-10-CM | POA: Diagnosis not present

## 2023-04-01 NOTE — Telephone Encounter (Signed)
That is fine 

## 2023-04-01 NOTE — Telephone Encounter (Signed)
Pt called wanting to know if the provider will take her husband on as a new patient

## 2023-05-15 ENCOUNTER — Other Ambulatory Visit: Payer: Self-pay

## 2023-05-15 DIAGNOSIS — C50511 Malignant neoplasm of lower-outer quadrant of right female breast: Secondary | ICD-10-CM

## 2023-05-15 DIAGNOSIS — Z171 Estrogen receptor negative status [ER-]: Secondary | ICD-10-CM

## 2023-05-18 ENCOUNTER — Inpatient Hospital Stay: Payer: BC Managed Care – PPO | Attending: Oncology

## 2023-05-18 DIAGNOSIS — Z923 Personal history of irradiation: Secondary | ICD-10-CM | POA: Insufficient documentation

## 2023-05-18 DIAGNOSIS — G629 Polyneuropathy, unspecified: Secondary | ICD-10-CM | POA: Diagnosis not present

## 2023-05-18 DIAGNOSIS — Z171 Estrogen receptor negative status [ER-]: Secondary | ICD-10-CM | POA: Insufficient documentation

## 2023-05-18 DIAGNOSIS — C50511 Malignant neoplasm of lower-outer quadrant of right female breast: Secondary | ICD-10-CM | POA: Diagnosis not present

## 2023-05-18 DIAGNOSIS — Z79899 Other long term (current) drug therapy: Secondary | ICD-10-CM | POA: Insufficient documentation

## 2023-05-18 LAB — LIPID PANEL
Cholesterol: 219 mg/dL — ABNORMAL HIGH (ref 0–200)
HDL: 74 mg/dL (ref >40)
LDL Cholesterol: 129 mg/dL — ABNORMAL HIGH (ref 0–99)
Total CHOL/HDL Ratio: 3 RATIO
Triglycerides: 80 mg/dL (ref <150)
VLDL: 16 mg/dL (ref 0–40)

## 2023-05-18 LAB — CMP (CANCER CENTER ONLY)
ALT: 17 U/L (ref 0–44)
AST: 31 U/L (ref 15–41)
Albumin: 4.2 g/dL (ref 3.5–5.0)
Alkaline Phosphatase: 86 U/L (ref 38–126)
Anion gap: 9 (ref 5–15)
BUN: 12 mg/dL (ref 8–23)
CO2: 27 mmol/L (ref 22–32)
Calcium: 9.1 mg/dL (ref 8.9–10.3)
Chloride: 101 mmol/L (ref 98–111)
Creatinine: 0.57 mg/dL (ref 0.44–1.00)
GFR, Estimated: >60 mL/min (ref >60)
Glucose, Bld: 80 mg/dL (ref 70–99)
Potassium: 3.7 mmol/L (ref 3.5–5.1)
Sodium: 137 mmol/L (ref 135–145)
Total Bilirubin: 0.8 mg/dL (ref 0.3–1.2)
Total Protein: 8 g/dL (ref 6.5–8.1)

## 2023-05-18 LAB — TSH: TSH: 1.976 u[IU]/mL (ref 0.350–4.500)

## 2023-05-18 LAB — CBC (CANCER CENTER ONLY)
HCT: 41.7 % (ref 36.0–46.0)
Hemoglobin: 13.9 g/dL (ref 12.0–15.0)
MCH: 32.8 pg (ref 26.0–34.0)
MCHC: 33.3 g/dL (ref 30.0–36.0)
MCV: 98.3 fL (ref 80.0–100.0)
Platelet Count: 192 10*3/uL (ref 150–400)
RBC: 4.24 MIL/uL (ref 3.87–5.11)
RDW: 13.5 % (ref 11.5–15.5)
WBC Count: 5.7 10*3/uL (ref 4.0–10.5)
nRBC: 0 % (ref 0.0–0.2)

## 2023-05-18 LAB — VITAMIN D 25 HYDROXY (VIT D DEFICIENCY, FRACTURES): Vit D, 25-Hydroxy: 46.8 ng/mL (ref 30–100)

## 2023-05-19 LAB — CANCER ANTIGEN 27.29: CA 27.29: 22.8 U/mL (ref 0.0–38.6)

## 2023-05-21 ENCOUNTER — Encounter: Payer: Self-pay | Admitting: Oncology

## 2023-05-21 ENCOUNTER — Inpatient Hospital Stay (HOSPITAL_BASED_OUTPATIENT_CLINIC_OR_DEPARTMENT_OTHER): Payer: BC Managed Care – PPO | Admitting: Oncology

## 2023-05-21 VITALS — BP 105/75 | HR 77 | Temp 97.6°F | Resp 18 | Ht 63.5 in | Wt 151.5 lb

## 2023-05-21 DIAGNOSIS — C50511 Malignant neoplasm of lower-outer quadrant of right female breast: Secondary | ICD-10-CM | POA: Diagnosis not present

## 2023-05-21 DIAGNOSIS — Z1231 Encounter for screening mammogram for malignant neoplasm of breast: Secondary | ICD-10-CM | POA: Diagnosis not present

## 2023-05-21 DIAGNOSIS — G629 Polyneuropathy, unspecified: Secondary | ICD-10-CM | POA: Diagnosis not present

## 2023-05-21 DIAGNOSIS — Z171 Estrogen receptor negative status [ER-]: Secondary | ICD-10-CM

## 2023-05-21 DIAGNOSIS — Z79899 Other long term (current) drug therapy: Secondary | ICD-10-CM | POA: Diagnosis not present

## 2023-05-21 DIAGNOSIS — Z923 Personal history of irradiation: Secondary | ICD-10-CM | POA: Diagnosis not present

## 2023-05-21 NOTE — Progress Notes (Signed)
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  INTERVAL HISTORY: Patient returns to clinic today for routine yearly evaluation.  She continues to feel well and remains asymptomatic.  She continues to have a chronic mild neuropathy that does not affect her day-to-day activity.  She has no other neurologic complaints.  She denies any recent fevers or illnesses.  She denies any chest pain, shortness of breath, cough, or hemoptysis.  She has no nausea, vomiting, constipation, or diarrhea.  She has no urinary complaints.  Patient offers no specific complaints today.  REVIEW OF SYSTEMS:   Review of Systems  Constitutional: Negative.  Negative for fever, malaise/fatigue and weight loss.  Respiratory: Negative.  Negative for cough, hemoptysis and shortness of breath.   Cardiovascular: Negative.  Negative for chest pain and leg swelling.  Gastrointestinal: Negative.  Negative for abdominal pain.  Genitourinary: Negative.  Negative for dysuria.  Musculoskeletal: Negative.  Negative for myalgias.  Skin: Negative.  Negative for rash.  Neurological:  Positive for sensory change. Negative for dizziness, focal weakness, weakness and headaches.  Psychiatric/Behavioral: Negative.  The patient is not nervous/anxious.    As per HPI. Otherwise, a complete review of systems is negative.   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 Wide excision, sentinel node biopsy.    Dehydration 05/28/2015   Fainting spell    Malignant neoplasm of lower-outer quadrant of right female breast (HCC) 12/17/2015   Migraines    Personal history of chemotherapy    Personal history of radiation therapy     PAST SURGICAL HISTORY: Past Surgical History:  Procedure Laterality Date   BREAST EXCISIONAL BIOPSY Right 02-21-15   wide excision/sentinel node biopsy.   BREAST LUMPECTOMY Right 2016   internal radiation, chemo    COLONOSCOPY  2013   Dr Mechele Collin   COLONOSCOPY WITH PROPOFOL N/A 03/26/2022   Procedure: COLONOSCOPY WITH PROPOFOL;  Surgeon: Earline Mayotte, MD;  Location: [facility] ENDOSCOPY;  Service: Endoscopy;  Laterality: N/A;   FLEXIBLE BRONCHOSCOPY N/A 08/31/2017   Procedure: FLEXIBLE BRONCHOSCOPY;  Surgeon: Shane Crutch, MD;  Location: [facility] ORS;  Service: Pulmonary;  Laterality: N/A;   PORTACATH PLACEMENT  03-29-15   Dr Lemar Livings    FAMILY HISTORY Family History  Problem Relation Age of Onset   Hypertension Mother    Heart failure Mother    Osteoarthritis Mother    Stroke Mother    Hypertension Father    Diabetes Father    Osteoporosis Sister    Epilepsy Sister    Breast cancer Neg Hx     GYNECOLOGIC HISTORY:  No LMP recorded. (Menstrual status: Chemotherapy).     ADVANCED DIRECTIVES:    HEALTH MAINTENANCE: Social History   Tobacco Use   Smoking status: Never   Smokeless tobacco: Never  Vaping Use   Vaping Use: Never used  Substance Use Topics   Alcohol use: No    Alcohol/week: 0.0 standard drinks of alcohol   Drug use: No     Colonoscopy:  PAP:  Bone density:  Lipid panel:  No Known Allergies  Current Outpatient Medications  Medication Sig Dispense Refill   aspirin EC 81 MG tablet Take 81 mg by mouth daily. Swallow whole.     b complex vitamins tablet Take 1 tablet by mouth daily.      Biotin 3 MG TABS Take 1 tablet (3 mg total) by mouth daily. 1 tablet 0   Calcium Carbonate-Vitamin D 600-200 MG-UNIT TABS Take 2 tablets by  mouth daily.     cholecalciferol (VITAMIN D3) 25 MCG (1000 UT) tablet Take 1,000 Units by mouth daily.     CINNAMON PO Take 1,000 mg by mouth daily.      ELDERBERRY PO Take by mouth.     furosemide (LASIX) 40 MG tablet TAKE 1 TABLET BY MOUTH DAILY AS NEEDED FOR EDEMA. 90 tablet 1   KLOR-CON M20 20 MEQ tablet TAKE 1 TABLET BY MOUTH EVERY DAY 90 tablet 1   Multiple Vitamin (MULTIVITAMIN WITH MINERALS) TABS tablet Take 1 tablet by mouth daily.     Omega-3 Fatty Acids (OMEGA 3 500) 500 MG CAPS Take 1,000 mg by mouth daily.      Probiotic Product ([employer] ADV PROBIOTIC GUMMIES PO) Take 2 tablets by mouth daily.      vitamin B-12 (CYANOCOBALAMIN) 1000 MCG tablet Take 1,000 mcg by mouth daily.     vitamin E 1000 UNIT capsule Take 1,000 Units by mouth daily.     No current facility-administered medications for this visit.   Facility-Administered Medications Ordered in Other Visits  Medication Dose Route Frequency Provider Last Rate Last Admin   sodium chloride 0.9 % injection 10 mL  10 mL Intravenous PRN Jeralyn Ruths, MD   10 mL at 10/30/15 1442    OBJECTIVE: BP 105/75 (BP Location: Left Arm, Patient Position: Sitting, Cuff Size: Normal)   Pulse 77   Temp 97.6 F (36.4 C) (Tympanic)   Resp 18   Ht 5' 3.5" (1.613 m)   Wt 151 lb 8 oz (68.7 kg)   SpO2 99%   BMI 26.42 kg/m    Body mass index is 26.42 kg/m.    ECOG FS:0 - Asymptomatic  General: Well-developed, well-nourished, no acute distress. Eyes: Pink conjunctiva, anicteric sclera. HEENT: Normocephalic, moist mucous membranes. Breast: Exam performed by another provider. Lungs: No audible wheezing or coughing. Heart: Regular rate and rhythm. Abdomen: Soft, nontender, no obvious distention. Musculoskeletal: No edema, cyanosis, or clubbing. Neuro: Alert, answering all questions appropriately. Cranial nerves grossly intact. Skin: No rashes or petechiae noted. Psych: Normal affect.  LAB RESULTS:  No visits with results within 3  Day(s) from this visit.  Latest known visit with results is:  Appointment on 05/18/2023  Component Date Value Ref Range Status   Sodium 05/18/2023 137  135 - 145 mmol/L Final   Potassium 05/18/2023 3.7  3.5 - 5.1 mmol/L Final   Chloride 05/18/2023 101  98 - 111 mmol/L Final   CO2 05/18/2023 27  22 - 32 mmol/L Final   Glucose, Bld 05/18/2023 80  70 - 99 mg/dL Final   Glucose reference range applies only to samples taken after fasting for at least 8 hours.   BUN 05/18/2023 12  8 - 23 mg/dL Final   Creatinine 62/13/0865 0.57  0.44 - 1.00 mg/dL Final   Calcium 78/46/9629 9.1  8.9 - 10.3 mg/dL Final   Total Protein 52/84/1324 8.0  6.5 - 8.1 g/dL Final   Albumin 40/08/2724 4.2  3.5 - 5.0 g/dL Final   AST  05/18/2023 31  15 - 41 U/L Final   ALT 05/18/2023 17  0 - 44 U/L Final   Alkaline Phosphatase 05/18/2023 86  38 - 126 U/L Final   Total Bilirubin 05/18/2023 0.8  0.3 - 1.2 mg/dL Final   GFR, Estimated 05/18/2023 >60  >60 mL/min Final   Comment: (NOTE) Calculated using the CKD-EPI Creatinine Equation (2021)    Anion gap 05/18/2023 9  5 - 15 Final   Performed at [facility], [address] [place], [place] [address]   CA 27.29 05/18/2023 22.8  0.0 - 38.6 U/mL Final   Comment: (NOTE) Siemens Centaur Immunochemiluminometric Methodology (ICMA) Values obtained with different assay methods or kits cannot be used interchangeably. Results cannot be interpreted as absolute evidence of the presence or absence of malignant disease. Performed At: [facility] [address] [place], [place] [id number] Jolene Schimke MD XB:[id number]    Vit D, 25-Hydroxy 05/18/2023 46.80  30 - 100 ng/mL Final   Comment: (NOTE) Vitamin D deficiency has been defined by the Institute of Medicine  and an Endocrine Society practice guideline as a level of serum 25-OH  vitamin D less than 20 ng/mL (1,2). The Endocrine Society went on to  further define vitamin D insufficiency as a level between 21 and  29  ng/mL (2).  1. IOM (Institute of Medicine). 2010. Dietary reference intakes for  calcium and D. [place]: [employer]. 2. Holick MF, Binkley Washta, Bischoff-Ferrari HA, et al. Evaluation,  treatment, and prevention of vitamin D deficiency: an Endocrine  Society clinical practice guideline, JCEM. 2011 Jul; 96(7): 1911-30.  Performed at [facility], [address] [place], [place] [address]    Cholesterol 05/18/2023 219 (H)  0 - 200 mg/dL Final   Triglycerides 78/46/9629 80  <150 mg/dL Final   HDL 52/84/1324 74  >40 mg/dL Final   Total CHOL/HDL Ratio 05/18/2023 3.0  RATIO Final   VLDL 05/18/2023 16  0 - 40 mg/dL Final   LDL Cholesterol 05/18/2023 129 (H)  0 - 99 mg/dL Final   Comment:        Total Cholesterol/HDL:CHD Risk Coronary Heart Disease Risk Table                     Men   Women  1/2 Average Risk   3.4   3.3  Average Risk       5.0   4.4  2 X Average Risk   9.6   7.1  3 X Average Risk  23.4   11.0        Use the calculated Patient Ratio above and the CHD Risk Table to determine the patient's CHD Risk.        ATP III CLASSIFICATION (LDL):  <100     mg/dL   Optimal  401-027  mg/dL   Near or Above                    Optimal  130-159  mg/dL   Borderline  253-664  mg/dL   High  >403     mg/dL   Very High Performed at [facility], [address] [place], [place] [address]    WBC Count 05/18/2023 5.7  4.0 - 10.5 K/uL Final   RBC 05/18/2023 4.24  3.87 - 5.11 MIL/uL Final   Hemoglobin 05/18/2023 13.9  12.0 - 15.0 g/dL Final   HCT 95/63/8756 41.7  36.0 - 46.0 % Final  MCV 05/18/2023 98.3  80.0 - 100.0 fL Final   MCH 05/18/2023 32.8  26.0 - 34.0 pg Final   MCHC 05/18/2023 33.3  30.0 - 36.0 g/dL Final   RDW 16/09/9603 13.5  11.5 - 15.5 % Final   Platelet Count 05/18/2023 192  150 - 400 K/uL Final   nRBC 05/18/2023 0.0  0.0 - 0.2 % Final   Performed at [facility], [address] [place], [place] [address]   TSH  05/18/2023 1.976  0.350 - 4.500 uIU/mL Final   Comment: Performed by a 3rd Generation assay with a functional sensitivity of <=0.01 uIU/mL. Performed at [facility], [address], [place], [place] [address]      STUDIES: No results found.  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 Patient is status post wide excision, MammoSite therapy, chemotherapy with Adriamycin and Cytoxan, as well as 12 weeks of Taxol.  She completed her chemotherapy on September 17, 2015.  Patient's most recent mammogram on January 01, 2023 was reported as BI-RADS 1.  Repeat in February 2025.  Her CA 27-29 continues to be within normal limits at 22.8.  Patient continues to prefer to have her breast exams by OB/GYN.  No intervention is needed.  Return to clinic in 1 year for routine evaluation.  Once patient is 10 years removed from completing her chemotherapy in 2026, she likely can be discharged from clinic.  Peripheral neuropathy: Mild.  Chronic and unchanged.  Patient no longer takes medication or is followed by neurology.   I spent a total of 20 minutes reviewing chart data, face-to-face evaluation with the patient, counseling and coordination of care as detailed above.   Patient expressed understanding and was in agreement with this plan. She also understands that She can call clinic at any time with any questions, concerns, or complaints.    Breast cancer [facility])   Staging form: Breast, AJCC 7th Edition     Clinical stage from 03/26/2015: Stage IA (T1c, N0, M0) - Signed by Jeralyn Ruths, MD on 03/26/2015   Jeralyn Ruths, MD   05/21/2023 3:04 PM

## 2023-06-18 ENCOUNTER — Telehealth: Payer: Self-pay | Admitting: Family Medicine

## 2023-06-18 NOTE — Telephone Encounter (Signed)
Prescription Request  06/18/2023  LOV: 07/02/2022  What is the name of the medication or equipment? KLOR-CON   Have you contacted your pharmacy to request a refill? Yes   Which pharmacy would you like this sent to?  [id number] [place], [place] - [address] [place] [address] Phone: [phone] Fax: [phone]    Patient notified that their request is being sent to the clinical staff for review and that they should receive a response within 2 business days.   Please advise at Mobile [phone] (mobile)

## 2023-06-19 MED ORDER — POTASSIUM CHLORIDE CRYS ER 20 MEQ PO TBCR: 20.0000 meq | EXTENDED_RELEASE_TABLET | Freq: Every day | ORAL | 1 refills | Status: DC

## 2023-07-01 ENCOUNTER — Other Ambulatory Visit: Payer: Self-pay | Admitting: Family Medicine

## 2023-07-01 DIAGNOSIS — R600 Localized edema: Secondary | ICD-10-CM

## 2023-07-07 ENCOUNTER — Encounter: Payer: Self-pay | Admitting: Family Medicine

## 2023-07-07 ENCOUNTER — Ambulatory Visit (INDEPENDENT_AMBULATORY_CARE_PROVIDER_SITE_OTHER): Payer: BC Managed Care – PPO | Admitting: Family Medicine

## 2023-07-07 VITALS — BP 122/76 | HR 70 | Temp 97.8°F | Ht 63.5 in | Wt 149.2 lb

## 2023-07-07 DIAGNOSIS — Z0000 Encounter for general adult medical examination without abnormal findings: Secondary | ICD-10-CM

## 2023-07-07 NOTE — Progress Notes (Signed)
Marikay Alar, MD Phone: [phone]  Wanda Lopez is a 62 y.o. female who presents today for CPE.  Diet: not good, eats lunch out, some vegetables, some fruits, half and half sweet tea daily, some juice Exercise: none Pap smear: due with GYN  Colonoscopy: 03/26/22 10 year recall Mammogram: 01/01/23 negative Family history-  Colon cancer: no  Breast cancer: sister  Ovarian cancer: no Menses: postmenopausal, notes recent work up with GYN for post menopausal bleeding, ultrasound was reassuring.  It looks like they were not able to do an endometrial biopsy given cervical stenosis.  Plan for repeat ultrasound at 4 months. Vaccines-   Flu: Due once available  Tetanus: Up-to-date  Shingles: Due  COVID19: X 6 HIV screening: Up-to-date Hep C Screening: Up-to-date Tobacco use: No Alcohol use: No Illicit Drug use: No Dentist: Yes Ophthalmology: Yes   The 10-year ASCVD risk score (Arnett DK, et al., 2019) is: 3.7%   Values used to calculate the score:     Age: 20 years     Sex: Female     Is Non-Hispanic African American: No     Diabetic: No     Tobacco smoker: No     Systolic Blood Pressure: 122 mmHg     Is BP treated: No     HDL Cholesterol: 74 mg/dL     Total Cholesterol: 219 mg/dL   Active Ambulatory Problems    Diagnosis Date Noted   Breast cancer (HCC) 01/30/2015   Malignant neoplasm of lower-outer quadrant of right breast of female, estrogen receptor negative (HCC) 03/07/2015   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   Osteoporosis 06/30/2016   Bilateral lower extremity edema 10/14/2016   Elevated LDL cholesterol level 04/18/2019   Routine general medical examination at a health care facility 06/21/2019   Resolved Ambulatory Problems    Diagnosis Date Noted   Dehydration 05/28/2015   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   Hemoptysis 08/12/2017   Past Medical History:  Diagnosis Date   Breast cancer of lower-outer quadrant of  right female breast (HCC) 02/09/2015   Fainting spell    Malignant neoplasm of lower-outer quadrant of right female breast (HCC) 12/17/2015   Migraines    Personal history of chemotherapy    Personal history of radiation therapy     Family History  Problem Relation Age of Onset   Hypertension Mother    Heart failure Mother    Osteoarthritis Mother    Stroke Mother    Hypertension Father    Diabetes Father    Osteoporosis Sister    Epilepsy Sister    Breast cancer Neg Hx     Social History   Socioeconomic History   Marital status: Married    Spouse name: Not on file   Number of children: Not on file   Years of education: Not on file   Highest education level: Not on file  Occupational History   Not on file  Tobacco Use   Smoking status: Never   Smokeless tobacco: Never  Vaping Use   Vaping status: Never Used  Substance and Sexual Activity   Alcohol use: No    Alcohol/week: 0.0 standard drinks of alcohol   Drug use: No   Sexual activity: Not on file  Other Topics Concern   Not on file  Social History Narrative   Not on file   Social Determinants of Health   Financial Resource Strain: Not on file  Food Insecurity: Not on file  Transportation Needs: Not  on file  Physical Activity: Not on file  Stress: Not on file  Social Connections: Not on file  Intimate Partner Violence: Not on file    ROS  General:  Negative for nexplained weight loss, fever Skin: Negative for new or changing mole, sore that won't heal HEENT: Negative for trouble hearing, trouble seeing, ringing in ears, mouth sores, hoarseness, change in voice, dysphagia. CV:  Negative for chest pain, dyspnea, edema, palpitations Resp: Negative for cough, dyspnea, hemoptysis GI: Negative for nausea, vomiting, diarrhea, constipation, abdominal pain, melena, hematochezia. GU: Negative for dysuria, incontinence, urinary hesitance, hematuria, vaginal or penile discharge, polyuria, sexual difficulty, lumps  in testicle or breasts MSK: Negative for muscle cramps or aches, joint pain or swelling Neuro: Positive for chronic numbness in tips of fingers and toes related to prior chemotherapy, negative for headaches, weakness, dizziness, passing out/fainting Psych: Negative for depression, anxiety, memory problems  Objective  Physical Exam Vitals:   07/07/23 0852  BP: 122/76  Pulse: 70  Temp: 97.8 F (36.6 C)  SpO2: 97%    BP Readings from Last 3 Encounters:  07/07/23 122/76  05/21/23 105/75  07/02/22 110/60   Wt Readings from Last 3 Encounters:  07/07/23 149 lb 3.2 oz (67.7 kg)  05/21/23 151 lb 8 oz (68.7 kg)  07/02/22 155 lb 6.4 oz (70.5 kg)    Physical Exam Constitutional:      General: She is not in acute distress.    Appearance: She is not diaphoretic.  Cardiovascular:     Rate and Rhythm: Normal rate and regular rhythm.     Heart sounds: Normal heart sounds.  Pulmonary:     Effort: Pulmonary effort is normal.     Breath sounds: Normal breath sounds.  Abdominal:     General: Bowel sounds are normal. There is no distension.     Palpations: Abdomen is soft.     Tenderness: There is no abdominal tenderness.  Musculoskeletal:     Right lower leg: No edema.     Left lower leg: No edema.  Lymphadenopathy:     Cervical: No cervical adenopathy.  Skin:    General: Skin is warm and dry.  Neurological:     Mental Status: She is alert.  Psychiatric:        Mood and Affect: Mood normal.      Assessment/Plan:   Routine general medical examination at a health care facility Assessment & Plan: Physical exam completed.  Encouraged healthy diet and exercise.  Discussed adding in exercise 3 days a week for 20 to 30 minutes at a time.  Discussed reduction in fast food intake.  Patient will follow-up with GYN for Pap smear.  Colonoscopy and mammogram are up-to-date.  She was encouraged to get the flu vaccine and updated COVID-vaccine when they come out later this fall.  Lab work  that was done in June has been reviewed.  Patient was encouraged to continue to follow with gynecology regarding her prior postmenopausal bleeding.  Encourage patient to get the Shingrix vaccine.  Patient was advised that this does not contain any live virus so she should be able to be around her grandchild after getting this vaccine.     Return in about 1 year (around 07/06/2024) for physical.   Marikay Alar, MD [facility]

## 2023-07-07 NOTE — Assessment & Plan Note (Addendum)
Physical exam completed.  Encouraged healthy diet and exercise.  Discussed adding in exercise 3 days a week for 20 to 30 minutes at a time.  Discussed reduction in fast food intake.  Patient will follow-up with GYN for Pap smear.  Colonoscopy and mammogram are up-to-date.  She was encouraged to get the flu vaccine and updated COVID-vaccine when they come out later this fall.  Lab work that was done in June has been reviewed.  Patient was encouraged to continue to follow with gynecology regarding her prior postmenopausal bleeding.  Encourage patient to get the Shingrix vaccine.  Patient was advised that this does not contain any live virus so she should be able to be around her grandchild after getting this vaccine.

## 2023-07-15 DIAGNOSIS — D2262 Melanocytic nevi of left upper limb, including shoulder: Secondary | ICD-10-CM | POA: Diagnosis not present

## 2023-07-15 DIAGNOSIS — D2261 Melanocytic nevi of right upper limb, including shoulder: Secondary | ICD-10-CM | POA: Diagnosis not present

## 2023-07-15 DIAGNOSIS — D2272 Melanocytic nevi of left lower limb, including hip: Secondary | ICD-10-CM | POA: Diagnosis not present

## 2023-07-15 DIAGNOSIS — D2271 Melanocytic nevi of right lower limb, including hip: Secondary | ICD-10-CM | POA: Diagnosis not present

## 2023-07-27 DIAGNOSIS — N950 Postmenopausal bleeding: Secondary | ICD-10-CM | POA: Diagnosis not present

## 2023-10-12 DIAGNOSIS — Z853 Personal history of malignant neoplasm of breast: Secondary | ICD-10-CM | POA: Diagnosis not present

## 2023-10-12 DIAGNOSIS — Z01419 Encounter for gynecological examination (general) (routine) without abnormal findings: Secondary | ICD-10-CM | POA: Diagnosis not present

## 2023-10-12 DIAGNOSIS — Z1231 Encounter for screening mammogram for malignant neoplasm of breast: Secondary | ICD-10-CM | POA: Diagnosis not present

## 2023-10-31 DIAGNOSIS — M25561 Pain in right knee: Secondary | ICD-10-CM | POA: Diagnosis not present

## 2023-12-11 ENCOUNTER — Other Ambulatory Visit: Payer: Self-pay | Admitting: Family Medicine

## 2024-01-04 ENCOUNTER — Ambulatory Visit
Admission: RE | Admit: 2024-01-04 | Discharge: 2024-01-04 | Disposition: A | Discharge status: Home / Self Care | Payer: BC Managed Care – PPO | Source: Ambulatory Visit | Attending: Oncology | Admitting: Oncology

## 2024-01-04 DIAGNOSIS — Z1231 Encounter for screening mammogram for malignant neoplasm of breast: Secondary | ICD-10-CM | POA: Insufficient documentation

## 2024-01-04 DIAGNOSIS — Z853 Personal history of malignant neoplasm of breast: Secondary | ICD-10-CM | POA: Diagnosis not present

## 2024-01-04 DIAGNOSIS — C50511 Malignant neoplasm of lower-outer quadrant of right female breast: Secondary | ICD-10-CM

## 2024-01-12 ENCOUNTER — Telehealth: Payer: Self-pay | Admitting: Nurse Practitioner

## 2024-01-12 NOTE — Telephone Encounter (Signed)
Left message for the patient to call the office to confirm the appointment time change from 9:00 to 9:40 on 07/12/24.

## 2024-02-25 DIAGNOSIS — M2241 Chondromalacia patellae, right knee: Secondary | ICD-10-CM | POA: Diagnosis not present

## 2024-03-11 DIAGNOSIS — M25561 Pain in right knee: Secondary | ICD-10-CM | POA: Diagnosis not present

## 2024-03-18 DIAGNOSIS — M25561 Pain in right knee: Secondary | ICD-10-CM | POA: Diagnosis not present

## 2024-03-22 DIAGNOSIS — M25561 Pain in right knee: Secondary | ICD-10-CM | POA: Diagnosis not present

## 2024-03-29 DIAGNOSIS — M25561 Pain in right knee: Secondary | ICD-10-CM | POA: Diagnosis not present

## 2024-03-31 DIAGNOSIS — M25561 Pain in right knee: Secondary | ICD-10-CM | POA: Diagnosis not present

## 2024-05-13 ENCOUNTER — Other Ambulatory Visit: Payer: Self-pay | Admitting: Nurse Practitioner

## 2024-05-13 ENCOUNTER — Other Ambulatory Visit: Payer: Self-pay | Admitting: Family

## 2024-05-13 DIAGNOSIS — R600 Localized edema: Secondary | ICD-10-CM

## 2024-05-13 NOTE — Telephone Encounter (Unsigned)
 Copied from CRM [id number]. Topic: Clinical - Medication Refill >> May 13, 2024  9:42 AM Chuck Crater wrote: Medication: furosemide  (LASIX ) 40 MG tablet [  Has the patient contacted their pharmacy? Yes (Agent: If no, request that the patient contact the pharmacy for the refill. If patient does not wish to contact the pharmacy document the reason why and proceed with request.) (Agent: If yes, when and what did the pharmacy advise?) Advised that they contacted doctor   This is the patient's preferred pharmacy:  [id number] [place], [place] - [address] [place] [address] Phone: [phone] Fax: [phone]  Is this the correct pharmacy for this prescription? Yes If no, delete pharmacy and type the correct one.   Has the prescription been filled recently? No  Is the patient out of the medication? No 5-6 left   Has the patient been seen for an appointment in the last year OR does the patient have an upcoming appointment? Yes  Can we respond through MyChart? Yes  Agent: Please be advised that Rx refills may take up to 3 business days. We ask that you follow-up with your pharmacy.

## 2024-05-16 ENCOUNTER — Other Ambulatory Visit: Payer: Self-pay | Admitting: *Deleted

## 2024-05-16 DIAGNOSIS — C50511 Malignant neoplasm of lower-outer quadrant of right female breast: Secondary | ICD-10-CM

## 2024-05-16 MED ORDER — FUROSEMIDE 40 MG PO TABS: ORAL_TABLET | ORAL | 1 refills | Status: DC

## 2024-05-17 ENCOUNTER — Inpatient Hospital Stay: Payer: BC Managed Care – PPO | Attending: Oncology

## 2024-05-17 DIAGNOSIS — Z853 Personal history of malignant neoplasm of breast: Secondary | ICD-10-CM | POA: Diagnosis present

## 2024-05-17 DIAGNOSIS — Z7982 Long term (current) use of aspirin: Secondary | ICD-10-CM | POA: Insufficient documentation

## 2024-05-17 DIAGNOSIS — Z923 Personal history of irradiation: Secondary | ICD-10-CM | POA: Diagnosis not present

## 2024-05-17 DIAGNOSIS — G629 Polyneuropathy, unspecified: Secondary | ICD-10-CM | POA: Diagnosis not present

## 2024-05-17 DIAGNOSIS — Z9221 Personal history of antineoplastic chemotherapy: Secondary | ICD-10-CM | POA: Insufficient documentation

## 2024-05-17 DIAGNOSIS — Z171 Estrogen receptor negative status [ER-]: Secondary | ICD-10-CM

## 2024-05-18 LAB — CANCER ANTIGEN 27.29: CA 27.29: 27.1 U/mL (ref 0.0–38.6)

## 2024-05-20 ENCOUNTER — Encounter: Payer: Self-pay | Admitting: Oncology

## 2024-05-20 ENCOUNTER — Inpatient Hospital Stay: Payer: BC Managed Care – PPO | Admitting: Oncology

## 2024-05-20 VITALS — BP 110/75 | HR 70 | Temp 97.6°F | Resp 16 | Wt 154.0 lb

## 2024-05-20 DIAGNOSIS — Z171 Estrogen receptor negative status [ER-]: Secondary | ICD-10-CM | POA: Diagnosis not present

## 2024-05-20 DIAGNOSIS — C50511 Malignant neoplasm of lower-outer quadrant of right female breast: Secondary | ICD-10-CM

## 2024-05-20 DIAGNOSIS — Z853 Personal history of malignant neoplasm of breast: Secondary | ICD-10-CM | POA: Diagnosis not present

## 2024-05-20 DIAGNOSIS — Z1231 Encounter for screening mammogram for malignant neoplasm of breast: Secondary | ICD-10-CM

## 2024-05-20 NOTE — Progress Notes (Signed)
 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  INTERVAL HISTORY: Patient returns to clinic today for routine yearly evaluation.  She continues to feel well and remains asymptomatic.  Her chronic peripheral neuropathy is unchanged and does not affect her day-to-day activity.  She has no other neurologic complaints.  She denies any recent fevers or illnesses.  She has a good appetite and denies weight loss.  She denies any pain.  She denies any chest pain, shortness of breath, cough, or hemoptysis.  She has no nausea, vomiting, constipation, or diarrhea.  She has no urinary complaints.  Patient feels at her baseline and offers no specific complaints today.  REVIEW OF SYSTEMS:   Review of Systems  Constitutional: Negative.  Negative for fever, malaise/fatigue and weight loss.  Respiratory: Negative.  Negative for cough, hemoptysis and shortness of breath.   Cardiovascular: Negative.  Negative for chest pain and leg swelling.  Gastrointestinal: Negative.  Negative for abdominal pain.  Genitourinary: Negative.  Negative for dysuria.  Musculoskeletal: Negative.  Negative for myalgias.  Skin: Negative.  Negative for rash.  Neurological:  Positive for sensory change. Negative for dizziness, focal weakness, weakness and headaches.  Psychiatric/Behavioral: Negative.  The patient is not nervous/anxious.    As per HPI. Otherwise, a complete review of systems is negative.   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 Wide excision, sentinel node biopsy.   Dehydration 05/28/2015   Fainting spell    Malignant neoplasm of lower-outer quadrant of right female breast (HCC) 12/17/2015   Migraines    Personal history of chemotherapy    Personal history of radiation therapy     PAST SURGICAL HISTORY: Past Surgical History:  Procedure Laterality Date   BREAST EXCISIONAL BIOPSY Right 02-21-15   wide excision/sentinel node biopsy.   BREAST LUMPECTOMY Right 2016   internal radiation, chemo    COLONOSCOPY  2013   Dr Felicita Horns   COLONOSCOPY WITH PROPOFOL  N/A 03/26/2022   Procedure: COLONOSCOPY WITH PROPOFOL ;  Surgeon: Marshall Skeeter, MD;  Location: [facility] ENDOSCOPY;  Service: Endoscopy;  Laterality: N/A;   FLEXIBLE BRONCHOSCOPY N/A 08/31/2017   Procedure: FLEXIBLE BRONCHOSCOPY;  Surgeon: Enos Harts, MD;  Location: [facility] ORS;  Service: Pulmonary;  Laterality: N/A;   PORTACATH PLACEMENT  03-29-15   Dr Marquita Situ    FAMILY HISTORY Family History  Problem Relation Age of Onset   Hypertension Mother    Heart failure Mother    Osteoarthritis Mother    Stroke Mother    Hypertension Father    Diabetes Father    Osteoporosis Sister    Breast cancer Sister     GYNECOLOGIC HISTORY:  No LMP recorded. (Menstrual status: Chemotherapy).     ADVANCED DIRECTIVES:    HEALTH MAINTENANCE: Social History   Tobacco Use   Smoking status: Never   Smokeless tobacco: Never  Vaping Use   Vaping status: Never Used  Substance Use Topics   Alcohol use: No    Alcohol/week: 0.0 standard drinks of alcohol   Drug use:  No     Colonoscopy:  PAP:  Bone density:  Lipid panel:  No Known Allergies  Current Outpatient Medications  Medication Sig Dispense Refill   aspirin EC 81 MG tablet Take 81 mg by mouth daily. Swallow whole.     b complex vitamins tablet Take 1 tablet by mouth daily.      Biotin  3 MG TABS Take 1 tablet (3 mg total) by mouth daily. 1  tablet 0   Calcium Carbonate-Vitamin D  600-200 MG-UNIT TABS Take 2 tablets by mouth daily.     cholecalciferol (VITAMIN D3) 25 MCG (1000 UT) tablet Take 1,000 Units by mouth daily.     CINNAMON PO Take 1,000 mg by mouth daily.      ELDERBERRY PO Take by mouth.     furosemide  (LASIX ) 40 MG tablet TAKE 1 TABLET BY MOUTH DAILY AS NEEDED FOR EDEMA. 90 tablet 1   KLOR-CON  M20 20 MEQ tablet TAKE 1 TABLET BY MOUTH EVERY DAY 90 tablet 1   Multiple Vitamin (MULTIVITAMIN WITH MINERALS) TABS tablet Take 1 tablet by mouth daily.     Omega-3 Fatty Acids (OMEGA 3 500) 500 MG CAPS Take 1,000 mg by mouth daily.      Probiotic Product ([employer] ADV PROBIOTIC GUMMIES PO) Take 2 tablets by mouth daily.      vitamin B-12 (CYANOCOBALAMIN) 1000 MCG tablet Take 1,000 mcg by mouth daily.     vitamin E 1000 UNIT capsule Take 1,000 Units by mouth daily.     No current facility-administered medications for this visit.   Facility-Administered Medications Ordered in Other Visits  Medication Dose Route Frequency Provider Last Rate Last Admin   sodium chloride  0.9 % injection 10 mL  10 mL Intravenous PRN Lumi Winslett J, MD   10 mL at 10/30/15 1442    OBJECTIVE: BP 110/75 (BP Location: Left Arm, Patient Position: Sitting, Cuff Size: Normal)   Pulse 70   Temp 97.6 F (36.4 C) (Tympanic)   Resp 16   Wt 154 lb (69.9 kg)   SpO2 97%   BMI 26.85 kg/m    Body mass index is 26.85 kg/m.    ECOG FS:0 - Asymptomatic  General: Well-developed, well-nourished, no acute distress. Eyes: Pink conjunctiva, anicteric sclera. HEENT: Normocephalic, moist mucous membranes. Breast: Exam performed by another provider. Lungs: No audible wheezing or coughing. Heart: Regular rate and rhythm. Abdomen: Soft, nontender, no obvious distention. Musculoskeletal: No edema, cyanosis, or clubbing. Neuro: Alert, answering all questions appropriately. Cranial nerves grossly intact. Skin: No rashes or petechiae noted. Psych: Normal  affect.  LAB RESULTS:  No visits with results within 3 Day(s) from this visit.  Latest known visit with results is:  Appointment on 05/17/2024  Component Date Value Ref Range Status   CA 27.29 05/17/2024 27.1  0.0 - 38.6 U/mL Final   Comment: (NOTE) Siemens Centaur Immunochemiluminometric Methodology (ICMA) Values obtained with different assay methods or kits cannot be used interchangeably. Results cannot be interpreted as absolute evidence of the presence or absence of malignant disease. Performed At: [facility] [address] [place], [place] [id number] Pearlean Botts MD MV:[phone]      STUDIES: No results found.  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 Patient is status post wide excision, MammoSite therapy, chemotherapy with Adriamycin  and Cytoxan , as well as 12 weeks of Taxol .  She  completed her chemotherapy on September 17, 2015.  Patient's most recent mammogram on January 04, 2024 was reported as BI-RADS 1.  Repeat in February 2026.  CA 27-29 continues to be within normal limits at 27.1. Patient continues to prefer to have her breast exams by OB/GYN.  No intervention is needed at this time.  Return to clinic in 1 year for routine evaluation at which time patient will be 10 years removed from completing her treatments and can be discharged from clinic.    Peripheral neuropathy: Mild.  Chronic and unchanged.  Patient no longer takes medication or is followed by neurology.   I spent a total of 20 minutes reviewing chart data, face-to-face evaluation with the patient, counseling and coordination of care as detailed above.   Patient expressed understanding and was in agreement with this plan. She also understands that She can call clinic at any time with any questions, concerns, or complaints.     Breast cancer [facility])   Staging form: Breast, AJCC 7th Edition     Clinical stage from 03/26/2015: Stage IA (T1c, N0, M0) - Signed by Shellie Dials, MD on 03/26/2015   Shellie Dials, MD   05/20/2024 9:36 AM

## 2024-06-09 ENCOUNTER — Other Ambulatory Visit: Payer: Self-pay

## 2024-06-09 MED ORDER — POTASSIUM CHLORIDE CRYS ER 20 MEQ PO TBCR: 20.0000 meq | EXTENDED_RELEASE_TABLET | Freq: Every day | ORAL | 3 refills | Status: AC

## 2024-07-11 ENCOUNTER — Encounter: Payer: BC Managed Care – PPO | Admitting: Family Medicine

## 2024-07-12 ENCOUNTER — Encounter: Payer: BC Managed Care – PPO | Admitting: Nurse Practitioner

## 2024-07-14 ENCOUNTER — Ambulatory Visit: Payer: PRIVATE HEALTH INSURANCE | Admitting: Nurse Practitioner

## 2024-07-14 VITALS — BP 110/70 | HR 81 | Resp 16 | Ht 64.0 in | Wt 152.6 lb

## 2024-07-14 DIAGNOSIS — Z171 Estrogen receptor negative status [ER-]: Secondary | ICD-10-CM

## 2024-07-14 DIAGNOSIS — M810 Age-related osteoporosis without current pathological fracture: Secondary | ICD-10-CM | POA: Diagnosis not present

## 2024-07-14 DIAGNOSIS — E7800 Pure hypercholesterolemia, unspecified: Secondary | ICD-10-CM

## 2024-07-14 DIAGNOSIS — Z1329 Encounter for screening for other suspected endocrine disorder: Secondary | ICD-10-CM

## 2024-07-14 DIAGNOSIS — K5900 Constipation, unspecified: Secondary | ICD-10-CM | POA: Diagnosis not present

## 2024-07-14 DIAGNOSIS — M545 Low back pain, unspecified: Secondary | ICD-10-CM

## 2024-07-14 DIAGNOSIS — C50511 Malignant neoplasm of lower-outer quadrant of right female breast: Secondary | ICD-10-CM | POA: Diagnosis not present

## 2024-07-14 LAB — TSH: TSH: 1.3 u[IU]/mL (ref 0.35–5.50)

## 2024-07-14 LAB — COMPREHENSIVE METABOLIC PANEL WITH GFR
ALT: 13 U/L (ref 0–35)
AST: 23 U/L (ref 0–37)
Albumin: 4 g/dL (ref 3.5–5.2)
Alkaline Phosphatase: 88 U/L (ref 39–117)
BUN: 11 mg/dL (ref 6–23)
CO2: 33 meq/L — ABNORMAL HIGH (ref 19–32)
Calcium: 9.1 mg/dL (ref 8.4–10.5)
Chloride: 99 meq/L (ref 96–112)
Creatinine, Ser: 0.55 mg/dL (ref 0.40–1.20)
GFR: 97.11 mL/min (ref >60.00)
Glucose, Bld: 87 mg/dL (ref 70–99)
Potassium: 4.2 meq/L (ref 3.5–5.1)
Sodium: 138 meq/L (ref 135–145)
Total Bilirubin: 0.6 mg/dL (ref 0.2–1.2)
Total Protein: 7.2 g/dL (ref 6.0–8.3)

## 2024-07-14 LAB — CBC WITH DIFFERENTIAL/PLATELET
Basophils Absolute: 0 K/uL (ref 0.0–0.1)
Basophils Relative: 0.9 % (ref 0.0–3.0)
Eosinophils Absolute: 0.3 K/uL (ref 0.0–0.7)
Eosinophils Relative: 5.5 % — ABNORMAL HIGH (ref 0.0–5.0)
HCT: 42.4 % (ref 36.0–46.0)
Hemoglobin: 14.2 g/dL (ref 12.0–15.0)
Lymphocytes Relative: 31.2 % (ref 12.0–46.0)
Lymphs Abs: 1.5 K/uL (ref 0.7–4.0)
MCHC: 33.5 g/dL (ref 30.0–36.0)
MCV: 96.8 fl (ref 78.0–100.0)
Monocytes Absolute: 0.5 K/uL (ref 0.1–1.0)
Monocytes Relative: 10 % (ref 3.0–12.0)
Neutro Abs: 2.5 K/uL (ref 1.4–7.7)
Neutrophils Relative %: 52.4 % (ref 43.0–77.0)
Platelets: 228 K/uL (ref 150.0–400.0)
RBC: 4.38 Mil/uL (ref 3.87–5.11)
RDW: 13.4 % (ref 11.5–15.5)
WBC: 4.7 K/uL (ref 4.0–10.5)

## 2024-07-14 LAB — LIPID PANEL
Cholesterol: 202 mg/dL — ABNORMAL HIGH (ref 0–200)
HDL: 59 mg/dL (ref >39.00)
LDL Cholesterol: 123 mg/dL — ABNORMAL HIGH (ref 0–99)
NonHDL: 142.99
Total CHOL/HDL Ratio: 3
Triglycerides: 102 mg/dL (ref 0.0–149.0)
VLDL: 20.4 mg/dL (ref 0.0–40.0)

## 2024-07-14 LAB — VITAMIN D 25 HYDROXY (VIT D DEFICIENCY, FRACTURES): VITD: 49.52 ng/mL (ref 30.00–100.00)

## 2024-07-14 NOTE — Progress Notes (Unsigned)
 Leron Glance, NP-C Phone: [phone]  Wanda Lopez is a 63 y.o. female who presents today for transfer of care.   Discussed the use of AI scribe software for clinical note transcription with the patient, who gave verbal consent to proceed.  History of Present Illness   Wanda Lopez is a 63 year old female who presents for transfer of care with constipation and back pain.  She experiences constipation and a sensation of bloating. Despite having bowel movements, including two small ones this morning and one yesterday, she feels they are insufficient. She has tried prunes, Mirafast, and stool softeners with minimal relief. She has Purelax at home, which she has not yet tried. She acknowledges not drinking enough water, which she believes may contribute to her symptoms.  She describes mild back pain that began after lifting a heavy ladder on August 1st, approximately two weeks ago. The pain is described as a 'naggy feel' across her lower back, noticeable when lying down or getting up, but not when sitting or walking. She has taken ibuprofen once with little effect. No numbness, tingling, or weakness in her legs, and no bladder issues. She wonders if the back pain could be related to her constipation.  She mentions a dental issue with an infected tooth that previously had a root canal. She is currently asymptomatic and has been given antibiotics to use if pain develops.      Social History   Tobacco Use  Smoking Status Never  Smokeless Tobacco Never    Current Outpatient Medications on File Prior to Visit  Medication Sig Dispense Refill   clobetasol ointment (TEMOVATE) 0.05 % Apply 1 Application topically.     Clobetasol Propionate 0.05 % shampoo Apply topically 2 (two) times daily as needed.     fluocinonide (LIDEX) 0.05 % external solution APPLY ONCE DAILY TO SCALP AND BEHIND THE EARS UNTIL CLEAR. THEN USE AS NEEDED FOR FLARES     b complex vitamins tablet Take 1 tablet by mouth  daily.      Biotin  3 MG TABS Take 1 tablet (3 mg total) by mouth daily. 1 tablet 0   Calcium Carbonate-Vitamin D  600-200 MG-UNIT TABS Take 2 tablets by mouth daily.     cholecalciferol (VITAMIN D3) 25 MCG (1000 UT) tablet Take 1,000 Units by mouth daily.     CINNAMON PO Take 1,000 mg by mouth daily.      ELDERBERRY PO Take by mouth.     furosemide  (LASIX ) 40 MG tablet TAKE 1 TABLET BY MOUTH DAILY AS NEEDED FOR EDEMA. 90 tablet 1   Multiple Vitamin (MULTIVITAMIN WITH MINERALS) TABS tablet Take 1 tablet by mouth daily.     Omega-3 Fatty Acids (OMEGA 3 500) 500 MG CAPS Take 1,000 mg by mouth daily.      potassium chloride  SA (KLOR-CON  M20) 20 MEQ tablet Take 1 tablet (20 mEq total) by mouth daily. 90 tablet 3   Probiotic Product ([employer] ADV PROBIOTIC GUMMIES PO) Take 2 tablets by mouth daily.      vitamin B-12 (CYANOCOBALAMIN) 1000 MCG tablet Take 1,000 mcg by mouth daily.     vitamin E 1000 UNIT capsule Take 1,000 Units by mouth daily.     Current Facility-Administered Medications on File Prior to Visit  Medication Dose Route Frequency Provider Last Rate Last Admin   sodium chloride  0.9 % injection 10 mL  10 mL Intravenous PRN Finnegan, Timothy J, MD   10 mL at 10/30/15 1442     ROS  see history of present illness  Objective  Physical Exam Vitals:   07/14/24 0817  BP: 110/70  Pulse: 81  Resp: 16  SpO2: 98%    BP Readings from Last 3 Encounters:  07/14/24 110/70  05/20/24 110/75  07/07/23 122/76   Wt Readings from Last 3 Encounters:  07/14/24 152 lb 9.6 oz (69.2 kg)  05/20/24 154 lb (69.9 kg)  07/07/23 149 lb 3.2 oz (67.7 kg)    Physical Exam Constitutional:      General: She is not in acute distress.    Appearance: Normal appearance.  HENT:     Head: Normocephalic.  Cardiovascular:     Rate and Rhythm: Normal rate and regular rhythm.     Heart sounds: Normal heart sounds.  Pulmonary:     Effort: Pulmonary effort is normal.     Breath sounds: Normal breath sounds.   Musculoskeletal:     Lumbar back: Normal. No spasms or tenderness. Normal range of motion.  Skin:    General: Skin is warm and dry.  Neurological:     General: No focal deficit present.     Mental Status: She is alert.  Psychiatric:        Mood and Affect: Mood normal.        Behavior: Behavior normal.      Assessment/Plan: Please see individual problem list.  Acute bilateral low back pain without sciatica Assessment & Plan: She has mild lower back pain, likely musculoskeletal and possibly from lifting, with no neurological symptoms or red flags. Discuss conservative management. Recommend alternating Tylenol  and ibuprofen for pain. Advise alternating ice and heat application for 20 minutes each in the evening. Return precautions given to patient.    Constipation, unspecified constipation type Assessment & Plan: She experiences bloating with recent bowel movements. Prunes, Mirafast, and stool softeners have provided some relief. Emphasize hydration and fiber intake. Recommend Purelax daily until regular bowel movements, then adjust as needed. Increase dietary fiber and fluid intake. Provide a list of high fiber foods. Return precautions given to patient.    Osteoporosis without current pathological fracture, unspecified osteoporosis type Assessment & Plan: Not interested in treatment at this time. She will continue daily vitamin D  and calcium supplementation. Encourage weight bearing exercises. We will continue to monitor.  Orders: -     VITAMIN D  25 Hydroxy (Vit-D Deficiency, Fractures)  Elevated LDL cholesterol level Assessment & Plan: Not on medication. Managed with healthy diet and regular exercise. Continue. Check lipid panel.   Orders: -     Comprehensive metabolic panel with GFR -     Lipid panel  Malignant neoplasm of lower-outer quadrant of right breast of female, estrogen receptor negative (HCC) Assessment & Plan: Mammogram up to date. Recent follow up with  Oncology in June. Continue current management.   Orders: -     CBC with Differential/Platelet  Thyroid  disorder screen -     TSH     Return in about 1 year (around 07/14/2025) for Annual Exam, sooner as needed.   Leron Glance, NP-C [facility]

## 2024-07-19 ENCOUNTER — Ambulatory Visit: Payer: Self-pay | Admitting: Nurse Practitioner

## 2024-07-22 ENCOUNTER — Encounter: Payer: Self-pay | Admitting: Nurse Practitioner

## 2024-07-22 NOTE — Assessment & Plan Note (Signed)
 Not on medication. Managed with healthy diet and regular exercise. Continue. Check lipid panel.

## 2024-07-22 NOTE — Assessment & Plan Note (Signed)
 Not interested in treatment at this time. She will continue daily vitamin D  and calcium supplementation. Encourage weight bearing exercises. We will continue to monitor.

## 2024-07-22 NOTE — Assessment & Plan Note (Signed)
 She has mild lower back pain, likely musculoskeletal and possibly from lifting, with no neurological symptoms or red flags. Discuss conservative management. Recommend alternating Tylenol  and ibuprofen for pain. Advise alternating ice and heat application for 20 minutes each in the evening. Return precautions given to patient.

## 2024-07-22 NOTE — Assessment & Plan Note (Signed)
 Mammogram up to date. Recent follow up with Oncology in June. Continue current management.

## 2024-07-22 NOTE — Assessment & Plan Note (Signed)
 She experiences bloating with recent bowel movements. Prunes, Mirafast, and stool softeners have provided some relief. Emphasize hydration and fiber intake. Recommend Purelax daily until regular bowel movements, then adjust as needed. Increase dietary fiber and fluid intake. Provide a list of high fiber foods. Return precautions given to patient.

## 2024-12-08 ENCOUNTER — Other Ambulatory Visit: Payer: Self-pay | Admitting: Nurse Practitioner

## 2024-12-08 DIAGNOSIS — R600 Localized edema: Secondary | ICD-10-CM

## 2025-01-10 ENCOUNTER — Inpatient Hospital Stay: Payer: PRIVATE HEALTH INSURANCE

## 2025-01-12 ENCOUNTER — Inpatient Hospital Stay: Payer: Self-pay | Admitting: Oncology

## 2025-01-13 ENCOUNTER — Ambulatory Visit: Admitting: Oncology

## 2025-01-18 ENCOUNTER — Inpatient Hospital Stay: Payer: Self-pay | Admitting: Oncology

## 2025-07-18 ENCOUNTER — Ambulatory Visit: Payer: PRIVATE HEALTH INSURANCE | Admitting: Nurse Practitioner
